# Patient Record
Sex: Male | Born: 1945 | Race: White | Hispanic: No | Marital: Married | Smoking: Former smoker
Health system: Southern US, Community
[De-identification: ages and names within clinical notes are randomized; demographics above are authoritative.]

## PROBLEM LIST (undated history)

## (undated) DIAGNOSIS — I10 Essential (primary) hypertension: Secondary | ICD-10-CM

## (undated) DIAGNOSIS — M6289 Other specified disorders of muscle: Secondary | ICD-10-CM

## (undated) HISTORY — PX: APPENDECTOMY: SHX54

## (undated) HISTORY — PX: HERNIA REPAIR: SHX51

## (undated) HISTORY — DX: Other specified disorders of muscle: M62.89

---

## 1999-03-08 ENCOUNTER — Ambulatory Visit (HOSPITAL_BASED_OUTPATIENT_CLINIC_OR_DEPARTMENT_OTHER): Admission: RE | Admit: 1999-03-08 | Discharge: 1999-03-08 | Payer: Self-pay | Admitting: Surgery

## 2004-10-23 ENCOUNTER — Ambulatory Visit: Payer: Self-pay | Admitting: Family Medicine

## 2004-11-02 ENCOUNTER — Ambulatory Visit: Payer: Self-pay

## 2004-11-29 ENCOUNTER — Ambulatory Visit: Payer: Self-pay | Admitting: Family Medicine

## 2006-11-19 DIAGNOSIS — M6289 Other specified disorders of muscle: Secondary | ICD-10-CM

## 2006-11-19 HISTORY — DX: Other specified disorders of muscle: M62.89

## 2006-11-22 ENCOUNTER — Ambulatory Visit (HOSPITAL_COMMUNITY): Admission: RE | Admit: 2006-11-22 | Discharge: 2006-11-22 | Payer: Self-pay | Admitting: *Deleted

## 2006-11-25 ENCOUNTER — Inpatient Hospital Stay (HOSPITAL_COMMUNITY): Admission: EM | Admit: 2006-11-25 | Discharge: 2007-01-06 | Payer: Self-pay | Admitting: Emergency Medicine

## 2006-11-28 ENCOUNTER — Ambulatory Visit: Payer: Self-pay | Admitting: Pulmonary Disease

## 2006-12-18 ENCOUNTER — Ambulatory Visit: Payer: Self-pay | Admitting: Infectious Diseases

## 2008-07-04 IMAGING — CR DG ABDOMEN ACUTE W/ 1V CHEST
3 series · 3 of 3 positions shown · non-contrast
Comparison: Comparison is made with chest radiograph from 11/22/06.

CLINICAL DATA: Nausea, vomiting, and constipation.
 ACUTE ABDOMINAL SERIES ? 3 VIEW:

[view not recorded (1 of 3)]
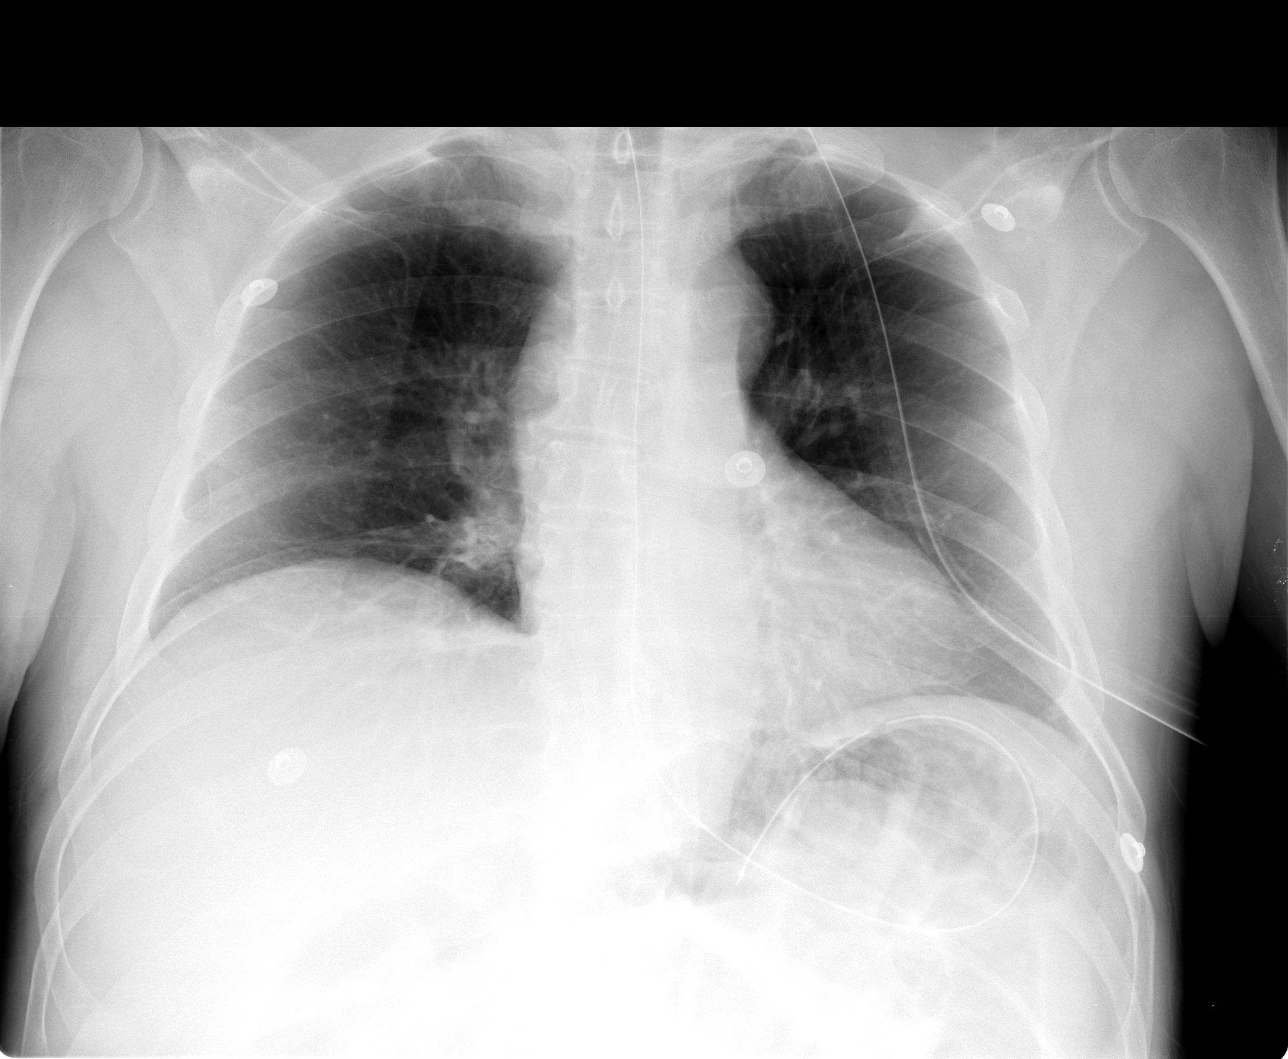

[view not recorded (2 of 3)]
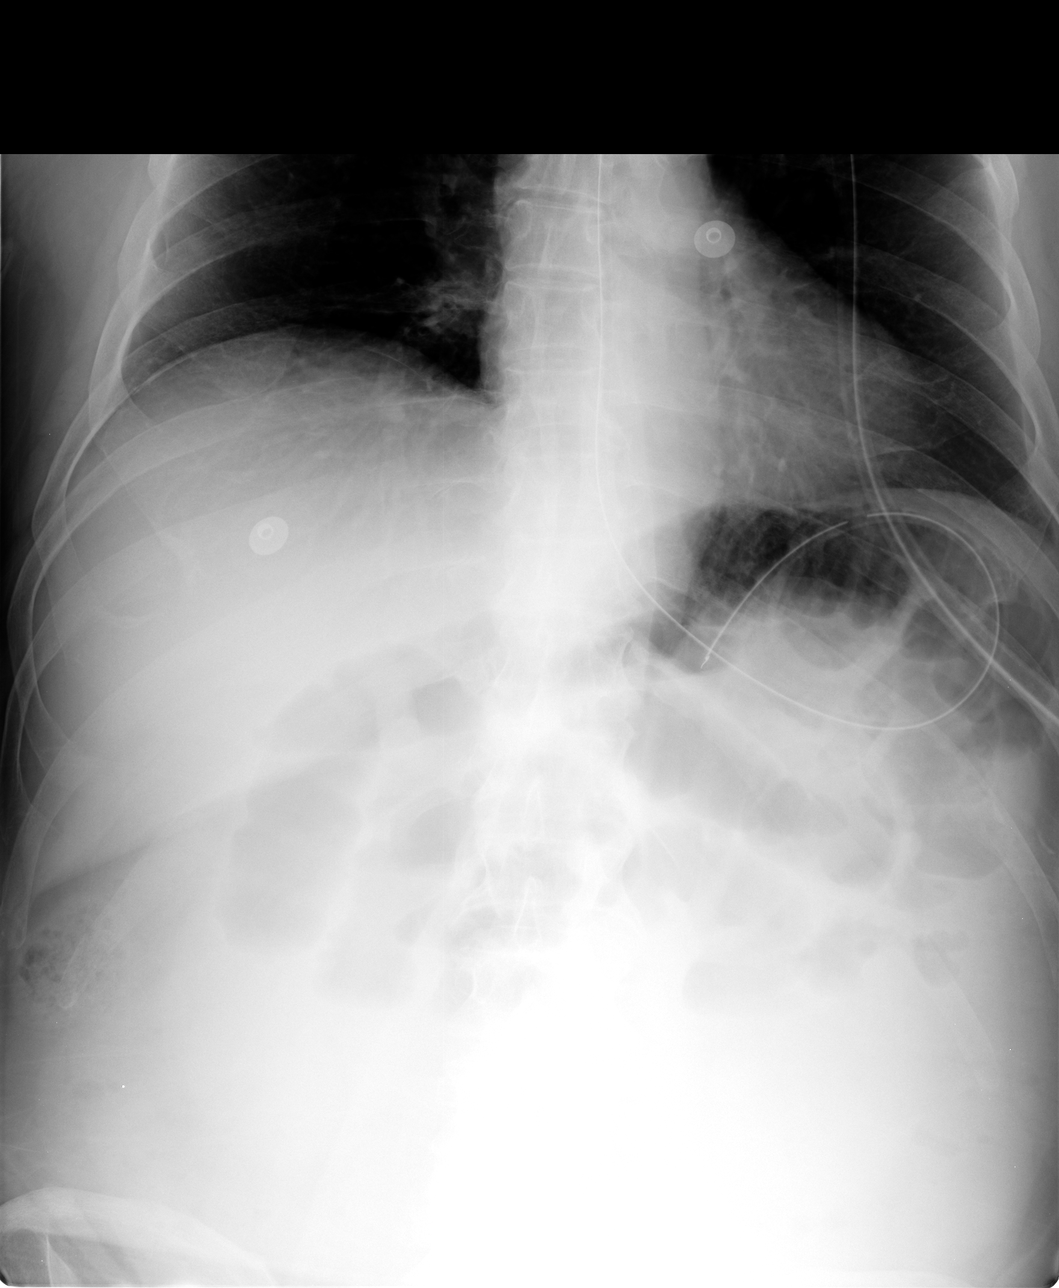

[view not recorded (3 of 3)]
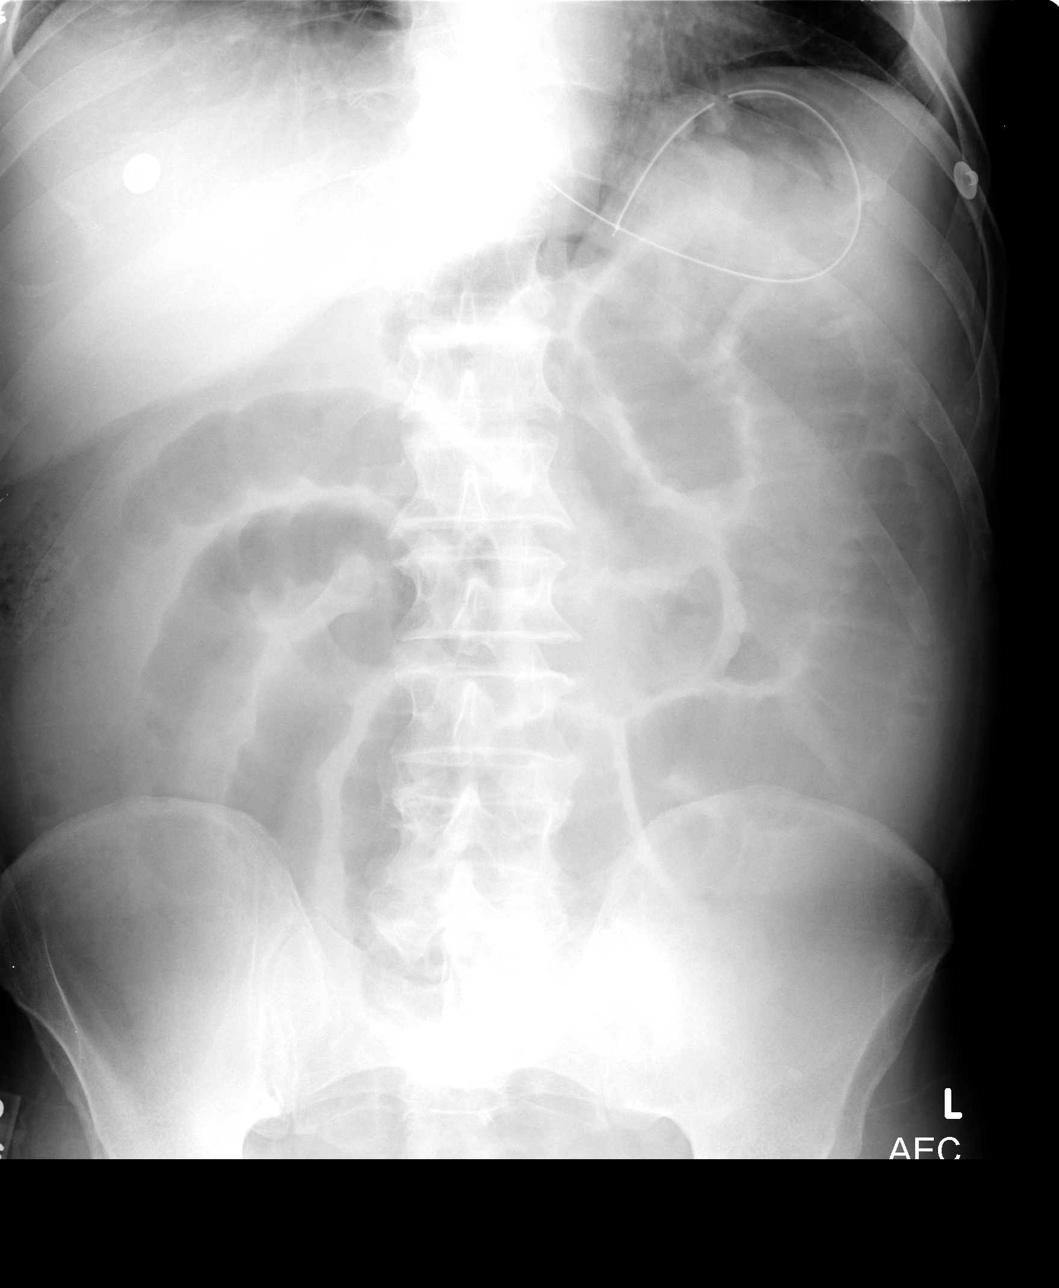

[3 of 3 positions shown; findings below may reference images not displayed]

FINDINGS: Nasogastric tube again terminates over the stomach. Lung volumes remain low with bibasilar atelectasis. Heart size is at the upper limits for normal. The aorta is unfolded. No free air beneath the diaphragms. 
 There are multiple dilated loops of small bowel with differential air-fluid levels, consistent with mid to distal small bowel obstruction. A paucity of gas in the colon is present. Surgical clips noted over the bilateral groins.
IMPRESSION: 1.  Mid to distal small bowel obstruction, no perforation. 
 2.  Low lung volumes with bibasilar atelectasis.

## 2008-07-12 IMAGING — CR DG ABD PORTABLE 1V
1 series · 1 of 1 positions shown · non-contrast
Comparison: 11/28/06.

CLINICAL DATA: Evaluate for ileus/small bowel obstruction.
 PORTABLE ABDOMEN ? 1 VIEW ? 12/03/06 ? 9982 HOURS:

[view not recorded]
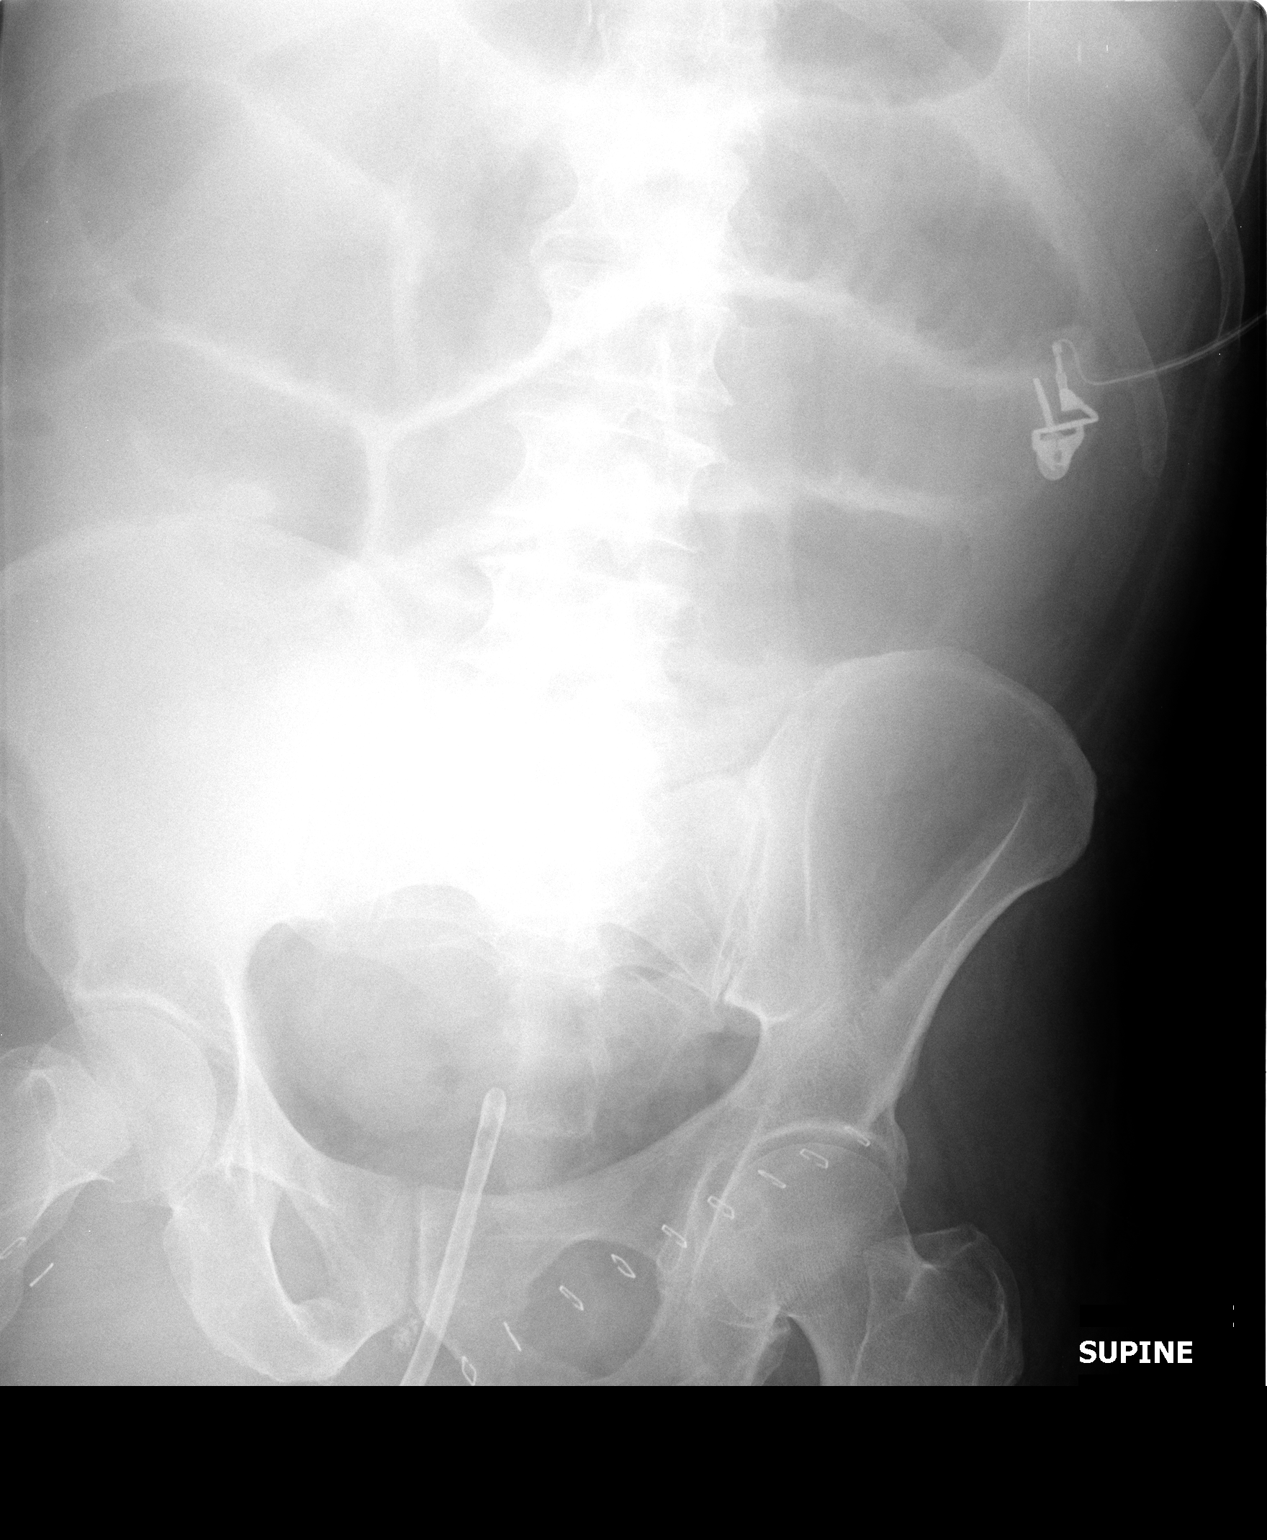

[1 of 1 positions shown; findings below may reference images not displayed]

FINDINGS: Limited portable exam reveals markedly dilated gas distended small bowel loops measuring up to 5.7 cm, which is the maximum dimension noted previously.  The folds appear slightly thickened.  The appearance is suggestive of a small bowel obstruction.  Free air cannot be excluded on a supine view.  Surgical clips inguinal region.  Catheter overlies the pelvis.
IMPRESSION: Persistent small bowel obstructive pattern with small bowel loops measuring up to 5.7 cm with thickened folds.

## 2008-07-12 IMAGING — CR DG CHEST 1V PORT
1 series · 1 of 1 positions shown · non-contrast
Comparison: 12/02/2006

CLINICAL DATA: Small bowel obstruction, renal failure. Status post extubation.

PORTABLE CHEST - 1 VIEW

[view not recorded]
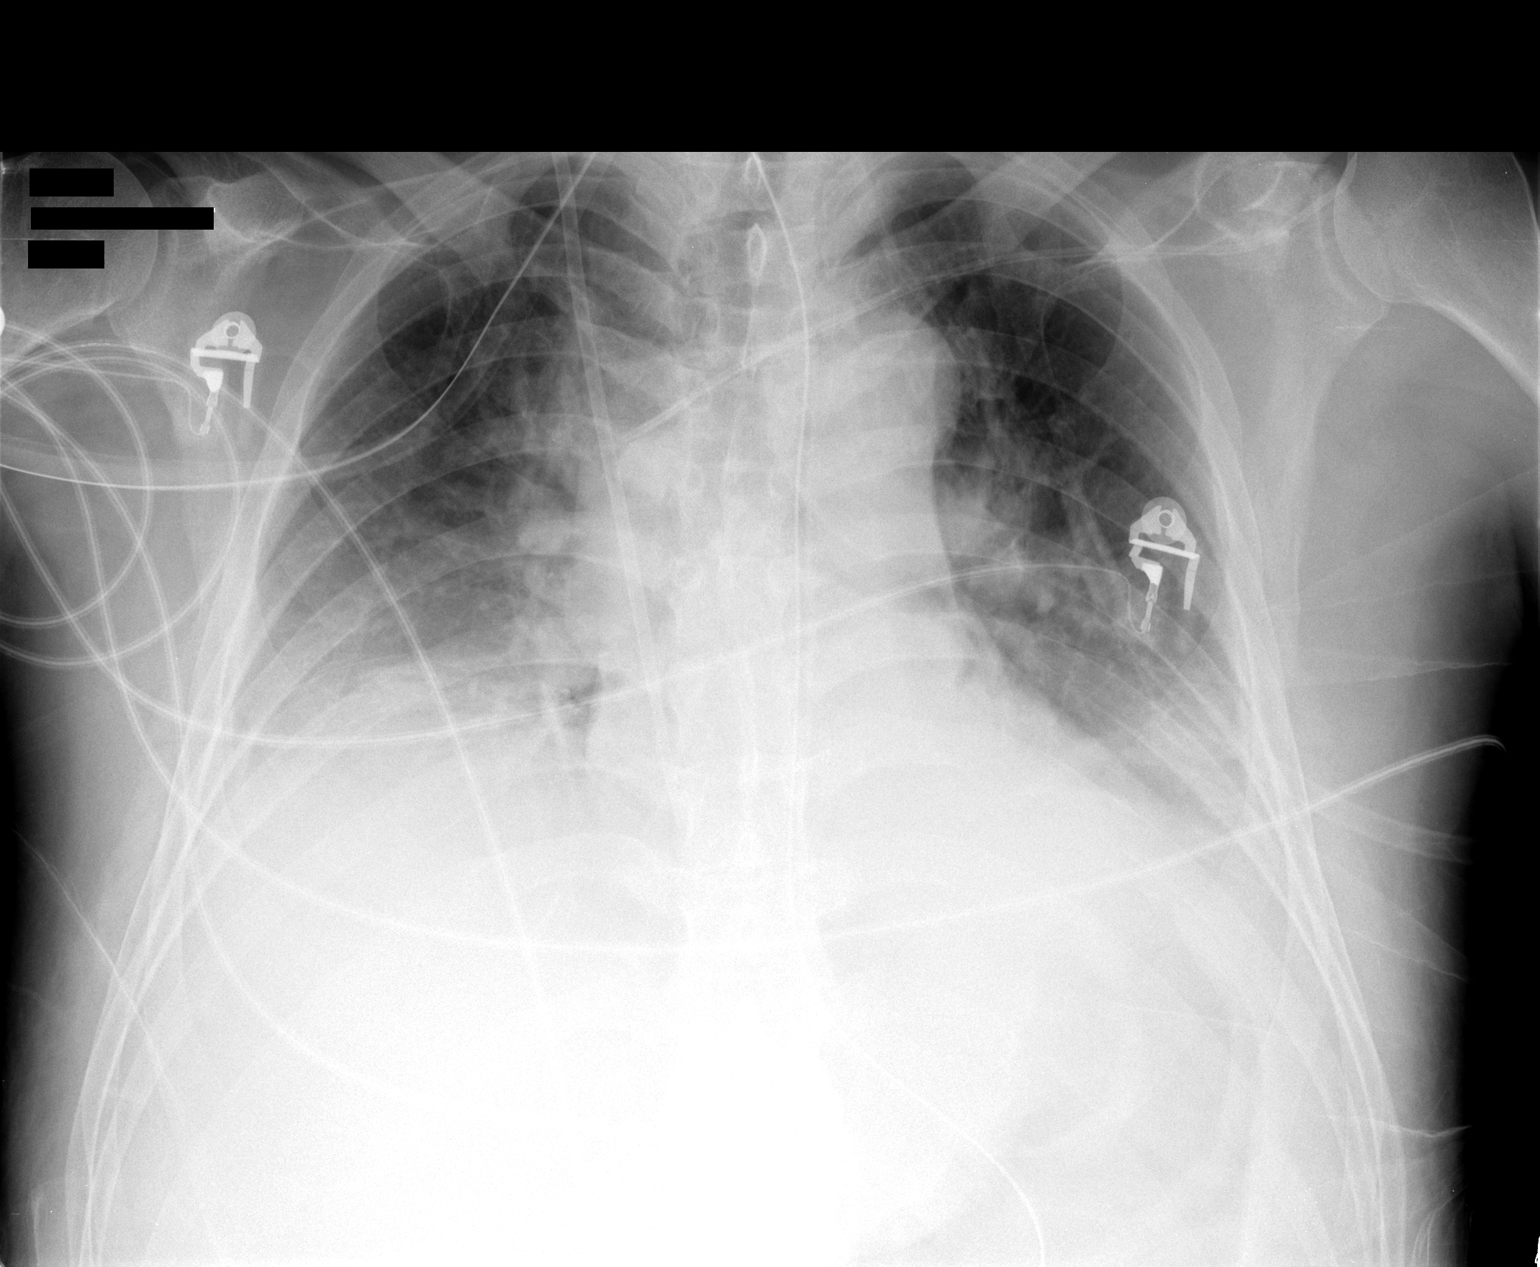

[1 of 1 positions shown; findings below may reference images not displayed]

FINDINGS: The endotracheal tube has been removed. Nasogastric tube and
bilateral central lines remain in place.

There is increased bibasilar airspace opacity with obscuration of the
hemidiaphragms, compatible with atelectasis. Layering pleural effusions cannot
be readily excluded. Heart size is at the upper limits of the normal range.  

IMPRESSION

1. Endotracheal tube has been removed.
2. Increased bibasilar airspace opacity likely representing atelectasis.

## 2008-07-14 IMAGING — CR DG CHEST 1V PORT
1 series · 1 of 1 positions shown · non-contrast
Comparison: 12/04/06.

CLINICAL DATA: Small bowel obstruction, renal failure.  Respiratory distress.
 PORTABLE CHEST ? 1 VIEW ? 12/05/06:

[view not recorded]
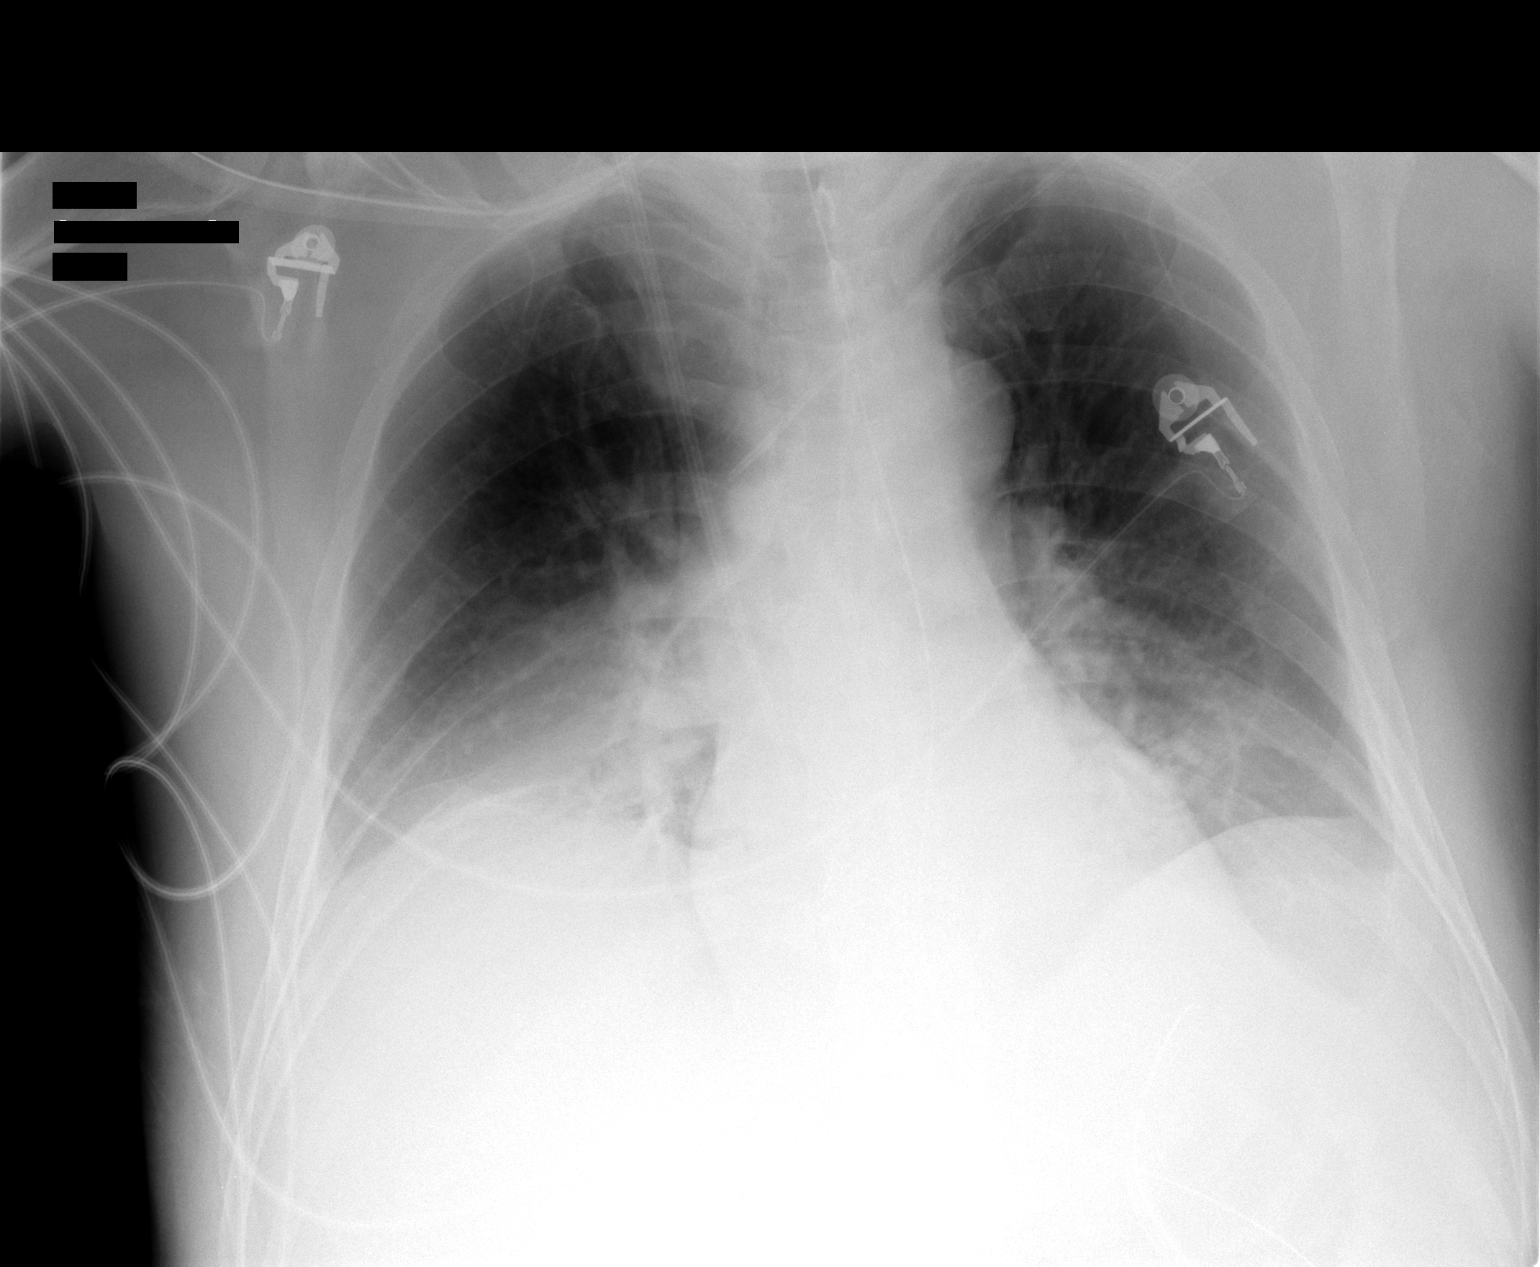

[1 of 1 positions shown; findings below may reference images not displayed]

FINDINGS: Left-sided central line terminates at the high superior vena cava.  There is a right IJ large bore catheter which terminates in the region of the superior cavoatrial junction.  Nasogastric tube is looped in the stomach with tip at the gastric fundus.  The trachea is midline.  The heart size is normal.  No pneumothorax.  Slight improvement in mild interstitial edema.  Similar right greater than left pleural effusions and adjacent bibasilar atelectasis.
IMPRESSION: 1.  Mild improvement in interstitial edema. 
 2.  Similar effusions and bibasilar atelectasis.

## 2016-09-26 ENCOUNTER — Encounter: Payer: Self-pay | Admitting: Family Medicine

## 2016-09-26 ENCOUNTER — Ambulatory Visit (INDEPENDENT_AMBULATORY_CARE_PROVIDER_SITE_OTHER): Payer: Medicare Other | Admitting: Family Medicine

## 2016-09-26 DIAGNOSIS — I10 Essential (primary) hypertension: Secondary | ICD-10-CM | POA: Diagnosis not present

## 2016-09-26 DIAGNOSIS — M8949 Other hypertrophic osteoarthropathy, multiple sites: Secondary | ICD-10-CM

## 2016-09-26 DIAGNOSIS — M150 Primary generalized (osteo)arthritis: Secondary | ICD-10-CM | POA: Diagnosis not present

## 2016-09-26 DIAGNOSIS — M159 Polyosteoarthritis, unspecified: Secondary | ICD-10-CM

## 2016-09-26 NOTE — Patient Instructions (Signed)
Good to see you today!  Thanks for coming in.  We will send for your medical records  Monitor your blood pressure - write down the readings.  Bring those in  Bring in your salves next visit  You can use otc Cortaid or Hydrocortione ointment - greasy  I recommend a colonoscopy to prvent colon cancer  Come back in 3 months

## 2016-09-26 NOTE — Progress Notes (Signed)
Subjective  First visit for this patient his wife and daughter have also joined our practice  Current Medical problems include  HYPERTENSION Disease Monitoring  Home BP Monitoring (Severity) he checks it occsl.  Last reading was 158/89 Symptoms - Chest pain- no    Dyspnea- no Medications (Modifying factors) Compliance-  Daily . Lightheadedness-  no  Edema- no Timing - continuous  Duration - years Is currently taking amlodipine and 40 meq Kcl per day as told to do by his prior PA because has low K.  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  No soft tissue swelling or redness or rashes.  Uses tylenol rarely.    Patient reports no  vision/ hearing changes,anorexia, weight change, fever ,adenopathy, persistant / recurrent hoarseness, swallowing issues, chest pain, edema,persistant / recurrent cough, hemoptysis, dyspnea(rest, exertional, paroxysmal nocturnal), gastrointestinal  bleeding (melena, rectal bleeding), abdominal pain, excessive heart burn, GU symptoms(dysuria, hematuria, pyuria, voiding/incontinence  Issues) syncope, focal weakness, severe memory loss, concerning skin lesions, depression, anxiety, abnormal bruising/bleeding, major joint swelling.     PMH Ruptured hernia repair in 2008 with subsequent sepsis and prolonged hospitilization     Chief Complaint noted Review of Symptoms - see HPI PMH - Smoking status noted.     Objective Vital Signs reviewed ATNC Ears:  External ear exam shows no significant lesions or deformities.  Otoscopic examination reveals clear canals, tympanic membranes are intact bilaterally without bulging, retraction, inflammation or discharge. Hearing is grossly normal bilaterall Neck:  No deformities, thyromegaly, masses, or tenderness noted.   Supple with full range of motion without pain. Heart - Regular rate and rhythm.  No murmurs, gallops or rubs.    Lungs:  Normal respiratory effort, chest  expands symmetrically. Lungs are clear to auscultation, no crackles or wheezes. Abdomen: soft and non-tender without masses, organomegaly or hernias noted.  No guarding or rebound Extremities:  No cyanosis, edema, or deformity noted with good range of motion of all major joints.   Skin:  Intact without suspicious lesions or rashes Mouth - no lesions, mucous membranes are moist, no decaying teeth      Assessments/Plans  No problem-specific Assessment & Plan notes found for this encounter.   See Encounter view if individual problem A/Ps not visible See after visit summary for details of patient instuctions

## 2016-09-27 ENCOUNTER — Encounter: Payer: Self-pay | Admitting: Family Medicine

## 2016-09-27 DIAGNOSIS — I10 Essential (primary) hypertension: Secondary | ICD-10-CM | POA: Insufficient documentation

## 2016-09-27 DIAGNOSIS — M1990 Unspecified osteoarthritis, unspecified site: Secondary | ICD-10-CM | POA: Insufficient documentation

## 2016-09-27 DIAGNOSIS — I872 Venous insufficiency (chronic) (peripheral): Secondary | ICD-10-CM | POA: Insufficient documentation

## 2016-09-27 NOTE — Assessment & Plan Note (Signed)
Multiple joints relatively mild

## 2016-09-27 NOTE — Assessment & Plan Note (Signed)
BP Readings from Last 3 Encounters:  09/26/16 (!) 158/89   Not at goal today.  Will send for records.  Is on an unsual regimen of CCB and K.  Will monitor See after visit summary

## 2016-10-10 ENCOUNTER — Ambulatory Visit: Payer: Self-pay | Admitting: Family Medicine

## 2016-11-21 ENCOUNTER — Ambulatory Visit (INDEPENDENT_AMBULATORY_CARE_PROVIDER_SITE_OTHER): Payer: Medicare HMO | Admitting: Family Medicine

## 2016-11-21 ENCOUNTER — Other Ambulatory Visit: Payer: Self-pay | Admitting: Family Medicine

## 2016-11-21 ENCOUNTER — Encounter: Payer: Self-pay | Admitting: Family Medicine

## 2016-11-21 DIAGNOSIS — I872 Venous insufficiency (chronic) (peripheral): Secondary | ICD-10-CM

## 2016-11-21 DIAGNOSIS — I10 Essential (primary) hypertension: Secondary | ICD-10-CM | POA: Diagnosis not present

## 2016-11-21 DIAGNOSIS — R296 Repeated falls: Secondary | ICD-10-CM

## 2016-11-21 LAB — COMPREHENSIVE METABOLIC PANEL
ALBUMIN: 4.1 g/dL (ref 3.6–5.1)
ALT: 13 U/L (ref 9–46)
AST: 14 U/L (ref 10–35)
Alkaline Phosphatase: 110 U/L (ref 40–115)
BILIRUBIN TOTAL: 0.9 mg/dL (ref 0.2–1.2)
BUN: 16 mg/dL (ref 7–25)
CO2: 27 mmol/L (ref 20–31)
CREATININE: 0.94 mg/dL (ref 0.70–1.18)
Calcium: 9.3 mg/dL (ref 8.6–10.3)
Chloride: 102 mmol/L (ref 98–110)
GLUCOSE: 107 mg/dL — AB (ref 65–99)
Potassium: 4 mmol/L (ref 3.5–5.3)
SODIUM: 139 mmol/L (ref 135–146)
Total Protein: 6.4 g/dL (ref 6.1–8.1)

## 2016-11-21 LAB — VITAMIN B12: VITAMIN B 12: 265 pg/mL (ref 200–1100)

## 2016-11-21 LAB — FOLATE: FOLATE: 13.5 ng/mL (ref >5.4)

## 2016-11-21 NOTE — Patient Instructions (Addendum)
Good to see you today!  Thanks for coming in.  Call in with the name of the leg cream you are using  Use vaseline on your legs at least twice a day  I will call you with the results of your blood test and what to do with your medications  When you come back for a followup blood test come in fasting no food for 8 hours  Go by a drug store several times and check your blood pressure and write it down

## 2016-11-21 NOTE — Progress Notes (Signed)
Subjective  Patient is presenting with the following illnesses  HYPERTENSION Disease Monitoring  Home BP Monitoring (Severity) They did not check at home Symptoms - Chest pain- no    Dyspnea- no Medications (Modifying factors) Compliance-  Daily amlodpine. Lightheadedness-  no  Edema- no Timing - continuous  Duration - years ROS - See HPI  Leg Rash Bilateral worse when dry very itchy.  Use small amounts of unknown ointment every other day (runs out too quickly otherwise)  No using any lubricants.   Sometimes he itches so hard it bleeds.  No current bleeding or fever   Falling Has frequent falls since had long inpatient stay about 4 years ago.  Is not progressive.  He feels he trips over things.  No recent injuries.   Does not think he has been tested for this.  No focal weakness or visual loss.  Does not drink alcohol     PMH Lab Review   Potassium  Date Value Ref Range Status  11/21/2016 4.0 3.5 - 5.3 mmol/L Final   Sodium  Date Value Ref Range Status  11/21/2016 139 135 - 146 mmol/L Final   Creat  Date Value Ref Range Status  11/21/2016 0.94 0.70 - 1.18 mg/dL Final    Comment:      For patients > or = 71 years of age: The upper reference limit for Creatinine is approximately 13% higher for people identified as African-American.          Chief Complaint noted Review of Symptoms - see HPI PMH - Smoking status noted.     Objective Vital Signs reviewed  Alert interactive Heart - Regular rate and rhythm.  No murmurs, gallops or rubs.    Lungs:  Normal respiratory effort, chest expands symmetrically. Lungs are clear to auscultation, no crackles or wheezes. Legs - hyperpigmented diffuse rash on lower shins.  No skin breakdown.  Minimal edema Able to stnd and walk and turn without assistance.  Able to stand on heels and toes    Assessments/Plans  No problem-specific Assessment & Plan notes found for this encounter.   See Encounter view if individual problem A/Ps  not visible See after visit summary for details of patient instuctions

## 2016-11-21 NOTE — Telephone Encounter (Signed)
Pt needs refills on amlodipine, potassium, and clotrimazole cream. Pt uses [facility] in [place]. ep

## 2016-11-22 ENCOUNTER — Encounter: Payer: Self-pay | Admitting: Family Medicine

## 2016-11-22 MED ORDER — AMLODIPINE BESYLATE 5 MG PO TABS: 5.0000 mg | ORAL_TABLET | Freq: Every day | ORAL | 1 refills | Status: DC

## 2016-11-22 MED ORDER — POTASSIUM CHLORIDE ER 10 MEQ PO TBCR: 20.0000 meq | EXTENDED_RELEASE_TABLET | Freq: Every day | ORAL | 1 refills | Status: DC

## 2016-11-22 NOTE — Progress Notes (Signed)
Subjective  Patient is presenting with the following illnesses     Chief Complaint noted Review of Symptoms - see HPI PMH - Smoking status noted.     Objective Vital Signs reviewed     Assessments/Plans  No problem-specific Assessment & Plan notes found for this encounter.   See Encounter view if individual problem A/Ps not visible See after visit summary for details of patient instuctions 

## 2016-11-22 NOTE — Assessment & Plan Note (Signed)
BP Readings from Last 3 Encounters:  11/21/16 (!) 150/78  09/26/16 (!) 158/89   Not at goal.   Will again ask to monitor at home.  Will check labs and lower his K dose if not low.

## 2016-11-22 NOTE — Assessment & Plan Note (Signed)
Seems to be very chronic.  Will check CMET and b12 folate.  Does not seem to have focal weakness or signs of focal CNS involvement.   Will monitor and get neuroimaging if persists or worsens

## 2016-11-22 NOTE — Assessment & Plan Note (Signed)
His lower leg rash seem consistent with stasis.  Will have him lubricate daily and he will call in name of cream he is using

## 2016-11-23 ENCOUNTER — Telehealth: Payer: Self-pay | Admitting: Family Medicine

## 2016-11-23 NOTE — Telephone Encounter (Signed)
Spoke with pharmacy and the pharmacist filled the script yesterday and just switched out the tablet form for the capsules and continued with the original script you sent in yesterday.  Patient made aware. Jazmin Hartsell,CMA

## 2016-11-23 NOTE — Telephone Encounter (Signed)
I can not find any 10 meq KCL capsules in our eRx database  Please call pharmacy and give a refill on his current Potassium but instead of 4 caps a day he should take two caps daily #60 with 3 refills   Also ask them for the specific name of his potassium pill  Thanks  LC

## 2016-11-23 NOTE — Telephone Encounter (Signed)
Pt called because we called in the potassium tablets in and he can not take any tablets. We need to change this to capsules so that he can take these. jw

## 2017-01-31 ENCOUNTER — Other Ambulatory Visit: Payer: Self-pay | Admitting: Family Medicine

## 2017-02-01 ENCOUNTER — Other Ambulatory Visit: Payer: Self-pay | Admitting: Family Medicine

## 2017-02-01 MED ORDER — POTASSIUM CHLORIDE ER 10 MEQ PO CPCR: ORAL_CAPSULE | ORAL | 2 refills | Status: DC

## 2017-02-01 NOTE — Telephone Encounter (Signed)
Needs refill on potassium.  [facility]

## 2017-03-06 ENCOUNTER — Encounter: Payer: Self-pay | Admitting: Family Medicine

## 2017-03-06 ENCOUNTER — Ambulatory Visit (INDEPENDENT_AMBULATORY_CARE_PROVIDER_SITE_OTHER): Payer: Medicare HMO | Admitting: Family Medicine

## 2017-03-06 VITALS — BP 142/86 | HR 67 | Temp 97.7°F | Ht 68.0 in | Wt 187.2 lb

## 2017-03-06 DIAGNOSIS — Z13220 Encounter for screening for lipoid disorders: Secondary | ICD-10-CM

## 2017-03-06 DIAGNOSIS — T1502XA Foreign body in cornea, left eye, initial encounter: Secondary | ICD-10-CM | POA: Diagnosis not present

## 2017-03-06 DIAGNOSIS — M159 Polyosteoarthritis, unspecified: Secondary | ICD-10-CM

## 2017-03-06 DIAGNOSIS — M8949 Other hypertrophic osteoarthropathy, multiple sites: Secondary | ICD-10-CM

## 2017-03-06 DIAGNOSIS — R635 Abnormal weight gain: Secondary | ICD-10-CM | POA: Diagnosis not present

## 2017-03-06 DIAGNOSIS — Z1159 Encounter for screening for other viral diseases: Secondary | ICD-10-CM | POA: Diagnosis not present

## 2017-03-06 DIAGNOSIS — H2513 Age-related nuclear cataract, bilateral: Secondary | ICD-10-CM | POA: Diagnosis not present

## 2017-03-06 DIAGNOSIS — I10 Essential (primary) hypertension: Secondary | ICD-10-CM | POA: Diagnosis not present

## 2017-03-06 DIAGNOSIS — M150 Primary generalized (osteo)arthritis: Secondary | ICD-10-CM

## 2017-03-06 NOTE — Assessment & Plan Note (Signed)
BP Readings from Last 3 Encounters:  03/06/17 (!) 142/86  11/21/16 (!) 150/78  09/26/16 (!) 158/89   Close to goal.  Will check labs may need increase

## 2017-03-06 NOTE — Assessment & Plan Note (Signed)
Worsening likely due to cutting back on physical activity and over eating.  See after visit summary

## 2017-03-06 NOTE — Progress Notes (Signed)
Subjective  Patient is presenting with the following illnesses  HYPERTENSION Disease Monitoring  Home BP Monitoring (Severity) not checking no meter Symptoms - Chest pain- no    Dyspnea- no Medications (Modifying factors) Compliance-  daily. Lightheadedness-  no  Edema- no Timing - continuous  Duration - years ROS - See HPI  PMH Lab Review   Potassium  Date Value Ref Range Status  11/21/2016 4.0 3.5 - 5.3 mmol/L Final   Sodium  Date Value Ref Range Status  11/21/2016 139 135 - 146 mmol/L Final   Creat  Date Value Ref Range Status  11/21/2016 0.94 0.70 - 1.18 mg/dL Final    Comment:      For patients > or = 71 years of age: The upper reference limit for Creatinine is approximately 13% higher for people identified as African-American.          ARTHRITIS Disease monitoring Most involved joints: all over limbs.   Symptoms:  Achy pain     Limitations: not working as much has cut back a good deal  Medication Management Compliance:  Tylenol daily   Abdominal pain: no   GI Bleeding:  No  Stasis Dermatitis Improved with vaseline but only using once a day.  No fevers or redness.  No spread    Chief Complaint noted Review of Symptoms - see HPI PMH - Smoking status noted.     Objective Vital Signs reviewed Heart - Regular rate and rhythm.  No murmurs, gallops or rubs.    Lungs:  Normal respiratory effort, chest expands symmetrically. Lungs are clear to auscultation, no crackles or wheezes. Extrem - no edema - stasis dermatitis with some scaly bilateral lower legs     Assessments/Plans  No problem-specific Assessment & Plan notes found for this encounter.   See Encounter view if individual problem A/Ps not visible See after visit summary for details of patient instuctions

## 2017-03-06 NOTE — Assessment & Plan Note (Signed)
Stable

## 2017-03-06 NOTE — Patient Instructions (Addendum)
Good to see you today!  Thanks for coming in.  I will call you if your tests are not good.  Otherwise I will send you a letter.  If you do not hear from me with in 2 weeks please call our office.     You need to get a colonoscopy to make sure you do not have colon cancer  Try to check your blood pressure when you can.  Call if it is regularly > 140/90  You are gaining too much weight.  Cut back on all your portion sizes as we discussed   Come back in 6 months

## 2017-03-07 ENCOUNTER — Encounter: Payer: Self-pay | Admitting: Family Medicine

## 2017-03-07 ENCOUNTER — Other Ambulatory Visit: Payer: Self-pay | Admitting: Family Medicine

## 2017-03-07 LAB — CMP14+EGFR
ALBUMIN: 4.4 g/dL (ref 3.5–4.8)
ALT: 26 IU/L (ref 0–44)
AST: 29 IU/L (ref 0–40)
Albumin/Globulin Ratio: 1.8 (ref 1.2–2.2)
Alkaline Phosphatase: 120 IU/L — ABNORMAL HIGH (ref 39–117)
BILIRUBIN TOTAL: 0.9 mg/dL (ref 0.0–1.2)
BUN / CREAT RATIO: 20 (ref 10–24)
BUN: 20 mg/dL (ref 8–27)
CHLORIDE: 99 mmol/L (ref 96–106)
CO2: 21 mmol/L (ref 18–29)
CREATININE: 0.98 mg/dL (ref 0.76–1.27)
Calcium: 9.3 mg/dL (ref 8.6–10.2)
GFR calc non Af Amer: 77 mL/min/{1.73_m2} (ref >59)
GFR, EST AFRICAN AMERICAN: 89 mL/min/{1.73_m2} (ref >59)
GLUCOSE: 94 mg/dL (ref 65–99)
Globulin, Total: 2.5 g/dL (ref 1.5–4.5)
Potassium: 4.3 mmol/L (ref 3.5–5.2)
Sodium: 140 mmol/L (ref 134–144)
TOTAL PROTEIN: 6.9 g/dL (ref 6.0–8.5)

## 2017-03-07 LAB — LIPID PANEL
CHOL/HDL RATIO: 3.3 ratio (ref 0.0–5.0)
Cholesterol, Total: 177 mg/dL (ref 100–199)
HDL: 53 mg/dL (ref >39)
LDL CALC: 78 mg/dL (ref 0–99)
TRIGLYCERIDES: 229 mg/dL — AB (ref 0–149)
VLDL Cholesterol Cal: 46 mg/dL — ABNORMAL HIGH (ref 5–40)

## 2017-03-07 LAB — HEPATITIS C ANTIBODY

## 2017-03-07 LAB — CBC
HEMATOCRIT: 39.6 % (ref 37.5–51.0)
HEMOGLOBIN: 13.1 g/dL (ref 13.0–17.7)
MCH: 27.8 pg (ref 26.6–33.0)
MCHC: 33.1 g/dL (ref 31.5–35.7)
MCV: 84 fL (ref 79–97)
Platelets: 231 10*3/uL (ref 150–379)
RBC: 4.72 x10E6/uL (ref 4.14–5.80)
RDW: 14 % (ref 12.3–15.4)
WBC: 5 10*3/uL (ref 3.4–10.8)

## 2017-03-07 NOTE — Progress Notes (Unsigned)
Subjective  Patient is presenting with the following illnesses     Chief Complaint noted Review of Symptoms - see HPI PMH - Smoking status noted.     Objective Vital Signs reviewed     Assessments/Plans  No problem-specific Assessment & Plan notes found for this encounter.   See Encounter view if individual problem A/Ps not visible See after visit summary for details of patient instuctions 

## 2017-04-02 ENCOUNTER — Telehealth: Payer: Self-pay | Admitting: Family Medicine

## 2017-04-02 NOTE — Telephone Encounter (Signed)
Spoke to his wife and she reports he doesn't want to get a colonoscopy done. She's tried to convince him it's beneficial but he ignored her advise. She also reports since he had his hernia surgery he can no longer work and his mind has been going "backwards". I asked her if she would like to schedule him a appt with Dr. Erin Hearing. She was unsure and states she will call back when she decides. - Mesha Guinyard

## 2017-05-03 ENCOUNTER — Other Ambulatory Visit: Payer: Self-pay | Admitting: Family Medicine

## 2017-08-01 ENCOUNTER — Other Ambulatory Visit: Payer: Self-pay | Admitting: *Deleted

## 2017-08-01 MED ORDER — POTASSIUM CHLORIDE ER 10 MEQ PO CPCR: ORAL_CAPSULE | ORAL | 0 refills | Status: DC

## 2017-08-01 NOTE — Telephone Encounter (Signed)
Unclear why on K+ since not on diuretic.  Will refill and ask to make an appointment.

## 2017-08-01 NOTE — Telephone Encounter (Signed)
Refill request for 90 day supply.  Shemika Robbs L, RN  

## 2017-08-25 ENCOUNTER — Other Ambulatory Visit: Payer: Self-pay | Admitting: Family Medicine

## 2017-09-04 ENCOUNTER — Encounter: Payer: Self-pay | Admitting: Family Medicine

## 2017-09-04 ENCOUNTER — Ambulatory Visit (INDEPENDENT_AMBULATORY_CARE_PROVIDER_SITE_OTHER): Payer: Medicare HMO | Admitting: Family Medicine

## 2017-09-04 VITALS — BP 146/88 | HR 74 | Temp 98.3°F | Ht 68.0 in | Wt 174.2 lb

## 2017-09-04 DIAGNOSIS — R635 Abnormal weight gain: Secondary | ICD-10-CM | POA: Diagnosis not present

## 2017-09-04 DIAGNOSIS — Z23 Encounter for immunization: Secondary | ICD-10-CM | POA: Diagnosis not present

## 2017-09-04 DIAGNOSIS — M8949 Other hypertrophic osteoarthropathy, multiple sites: Secondary | ICD-10-CM

## 2017-09-04 DIAGNOSIS — M159 Polyosteoarthritis, unspecified: Secondary | ICD-10-CM

## 2017-09-04 DIAGNOSIS — M150 Primary generalized (osteo)arthritis: Secondary | ICD-10-CM | POA: Diagnosis not present

## 2017-09-04 DIAGNOSIS — I10 Essential (primary) hypertension: Secondary | ICD-10-CM

## 2017-09-04 DIAGNOSIS — Z1211 Encounter for screening for malignant neoplasm of colon: Secondary | ICD-10-CM | POA: Diagnosis not present

## 2017-09-04 DIAGNOSIS — I872 Venous insufficiency (chronic) (peripheral): Secondary | ICD-10-CM | POA: Diagnosis not present

## 2017-09-04 NOTE — Assessment & Plan Note (Signed)
Will check Bmet for K.  If normal stop his K pill.  Monitor blood pressure at home (wife is to buy a blood pressure cuff)

## 2017-09-04 NOTE — Assessment & Plan Note (Signed)
He feels is stable

## 2017-09-04 NOTE — Assessment & Plan Note (Signed)
Improved.  Cautioned about frequent Dr Malachi Bonds drinking

## 2017-09-04 NOTE — Patient Instructions (Signed)
Good to see you today!  Thanks for coming in.  I will call you if your tests are not good.  Otherwise I will send you a letter.  If you do not hear from me with in 2 weeks please call our office.     Cut back on the Dr Samson Frederic - try flavored water.  Your weight is good now try to keep it the same  Come back in 6 months for a blood pressure and blood sugar and weight check  Happy Holidays

## 2017-09-04 NOTE — Progress Notes (Signed)
Subjective  Patient is presenting with the following illnesses  STASIS DERMATITIS Feels is better.  Still itches but less scratching and sores.  No swelling.  Using vaseline  WEIGHT GAIN Has worked on decreasing food at night.  Feels has lost weight.  No fever chills or loss of appetite   HYPERTENSION Disease Monitoring  Home BP Monitoring (Severity) Not checking Symptoms - Chest pain- no    Dyspnea- no Medications (Modifying factors) Compliance-  daily. Lightheadedness-  no  Edema- no Timing - continuous  Duration - years ROS - See HPI  PMH Lab Review   Potassium  Date Value Ref Range Status  03/06/2017 4.3 3.5 - 5.2 mmol/L Final   Sodium  Date Value Ref Range Status  03/06/2017 140 134 - 144 mmol/L Final   Creat  Date Value Ref Range Status  11/21/2016 0.94 0.70 - 1.18 mg/dL Final    Comment:      For patients > or = 71 years of age: The upper reference limit for Creatinine is approximately 13% higher for people identified as African-American.      Creatinine, Ser  Date Value Ref Range Status  03/06/2017 0.98 0.76 - 1.27 mg/dL Final           Chief Complaint noted Review of Symptoms - see HPI PMH - Smoking status noted.     Objective Vital Signs reviewed Heart - Regular rate and rhythm.  No murmurs, gallops or rubs.    Lungs:  Normal respiratory effort, chest expands symmetrically. Lungs are clear to auscultation, no crackles or wheezes. Extrem - lower legs with varicose veins hyperpigmentation and dry.  No open sores.  No edema Healing laceration on left wrist well bandaged with steristrips   Assessments/Plans  DJD (degenerative joint disease) He feels is stable   Stasis dermatitis Improved continue treatment - more frequent vaseline  Weight gain Improved.  Cautioned about frequent Dr Malachi Bonds drinking   Essential hypertension Will check Bmet for K.  If normal stop his K pill.  Monitor blood pressure at home (wife is to buy a blood pressure  cuff)    See after visit summary for details of patient instuctions

## 2017-09-04 NOTE — Assessment & Plan Note (Signed)
Improved continue treatment - more frequent vaseline

## 2017-09-05 ENCOUNTER — Encounter: Payer: Self-pay | Admitting: Family Medicine

## 2017-09-05 LAB — BASIC METABOLIC PANEL
BUN / CREAT RATIO: 13 (ref 10–24)
BUN: 12 mg/dL (ref 8–27)
CO2: 26 mmol/L (ref 20–29)
Calcium: 9.7 mg/dL (ref 8.6–10.2)
Chloride: 103 mmol/L (ref 96–106)
Creatinine, Ser: 0.89 mg/dL (ref 0.76–1.27)
GFR calc Af Amer: 99 mL/min/{1.73_m2} (ref >59)
GFR, EST NON AFRICAN AMERICAN: 86 mL/min/{1.73_m2} (ref >59)
GLUCOSE: 97 mg/dL (ref 65–99)
POTASSIUM: 4.3 mmol/L (ref 3.5–5.2)
SODIUM: 146 mmol/L — AB (ref 134–144)

## 2017-10-08 ENCOUNTER — Encounter: Payer: Self-pay | Admitting: Family Medicine

## 2017-11-13 ENCOUNTER — Other Ambulatory Visit: Payer: Self-pay | Admitting: Family Medicine

## 2017-11-22 ENCOUNTER — Other Ambulatory Visit: Payer: Self-pay | Admitting: Family Medicine

## 2017-12-06 ENCOUNTER — Ambulatory Visit (INDEPENDENT_AMBULATORY_CARE_PROVIDER_SITE_OTHER): Payer: Medicare HMO | Admitting: Family Medicine

## 2017-12-06 ENCOUNTER — Encounter: Payer: Self-pay | Admitting: Family Medicine

## 2017-12-06 DIAGNOSIS — R059 Cough, unspecified: Secondary | ICD-10-CM

## 2017-12-06 DIAGNOSIS — R05 Cough: Secondary | ICD-10-CM | POA: Diagnosis not present

## 2017-12-06 NOTE — Progress Notes (Signed)
   Subjective:   Patient ID: Daniel [name]    DOB: [birth year], 72 y.o. male   MRN: [id number]  CC: cough  HPI: Daniel [name] is a 71 y.o. male who presents to clinic today for cough and congestion.  Symptoms first began about 1 week ago with cough, runny nose, sneezing and some mild congestion.  Sore throat on [name], but has since resolved.  Cough is productive of yellow sputum and is non-bloody.  Denies fevers, nausea, vomiting, diarrhea, abdominal pain.  Denies CP and SOB.  Endorses body aches and chills.  He has been eating and drinking fine.  He took Dayquil for his symptoms and states he does not drink as much water as he probably should. However, he is able to tolerate po.     ROS: See HPI for pertinent ROS.  [place]: Pertinent past medical, surgical, family, and social history were reviewed and updated as appropriate. Smoking status reviewed Medications reviewed.  Objective:   BP 130/80 (BP Location: Left Arm, Patient Position: Sitting, Cuff Size: Normal)   Pulse 79   Temp 98.2 F (36.8 C) (Oral)   Ht 5\' 8"  (1.727 m)   Wt 180 lb 3.2 oz (81.7 kg)   SpO2 96%   BMI 27.40 kg/m  Vitals and nursing note reviewed.  General: elderly 72 yo male, NAD  HEENT: NCAT, PERRL, EOMI, MMM, o/p clear without tonsillar exudate or erythema  Neck: supple, no LAD  CV: RRR no MRG  Lungs: CTAB, nonlaboured  Abdomen: soft, NTND, +bs  Skin: warm, dry, no rash Extremities: warm and well perfused Neuro: AOx3, no focal deficits   Assessment & Plan:   Cough Likely 2/2 influenza, with URI symptoms x 1 week; some resolution of symptoms such as sore throat.  Without red flags on exam and vitals stable.  No findings on lung exam to suggest pneumonia or other more serious infection.  He is out of the 48 hour window for which Tamiflu would benefit.   -Advised supportive measures ie warm fluids, increase water intake  -Tylenol if needed for fever  -Continue Dayquil for symptomatic tx; tessalon pearles  offered however pt declined -return precautions discussed   Follow up: if symptoms worsen or do not improve.   Lovenia Kim, MD [place], PGY-2 12/09/2017 10:19 PM

## 2017-12-06 NOTE — Patient Instructions (Signed)
You were seen in clinic for flu-like symptoms which is most likely what you have.  Unfortunately, as we discussed, you are out of the 48 hour window when Tamiflu would be beneficial for you.  I suspect your symptoms should start to improve over the next 3-4 days as it has been one week. You can continue to take Dayquil for symptomatic treatment of runny nose, cough, congestion and body aches.  I would like you to make sure you are frequently washing your hands to avoid spread. It is also equally important for you to make sure you are drinking plenty of fluids as this will help your recovery.   If your symptoms do not improve by next week I would like you to come in and be seen.    Be well, Lovenia Kim, MD

## 2017-12-09 DIAGNOSIS — R059 Cough, unspecified: Secondary | ICD-10-CM | POA: Insufficient documentation

## 2017-12-09 DIAGNOSIS — R05 Cough: Secondary | ICD-10-CM | POA: Insufficient documentation

## 2017-12-09 NOTE — Assessment & Plan Note (Signed)
Likely 2/2 influenza, with URI symptoms x 1 week; some resolution of symptoms such as sore throat.  Without red flags on exam and vitals stable.  No findings on lung exam to suggest pneumonia or other more serious infection.  He is out of the 48 hour window for which Tamiflu would benefit.   -Advised supportive measures ie warm fluids, increase water intake  -Tylenol if needed for fever  -Continue Dayquil for symptomatic tx; tessalon pearles offered however pt declined -return precautions discussed

## 2018-02-13 ENCOUNTER — Telehealth: Payer: Self-pay | Admitting: Family Medicine

## 2018-02-13 NOTE — Telephone Encounter (Signed)
Spoke to both the pt and his wife and he has not had a colonoscopy and is concerned about an internal instrument.  I spoke to them about have a stool sample collection and speaking to you if that is the best option for the pt.  Daniel [name] said that he will think about it and both he and his wife will discuss it with you at his next appt.

## 2018-03-27 DIAGNOSIS — Z9181 History of falling: Secondary | ICD-10-CM | POA: Diagnosis not present

## 2018-03-27 DIAGNOSIS — I10 Essential (primary) hypertension: Secondary | ICD-10-CM | POA: Diagnosis not present

## 2018-03-27 DIAGNOSIS — Z809 Family history of malignant neoplasm, unspecified: Secondary | ICD-10-CM | POA: Diagnosis not present

## 2018-03-27 DIAGNOSIS — Z8249 Family history of ischemic heart disease and other diseases of the circulatory system: Secondary | ICD-10-CM | POA: Diagnosis not present

## 2018-03-27 DIAGNOSIS — R52 Pain, unspecified: Secondary | ICD-10-CM | POA: Diagnosis not present

## 2018-05-31 ENCOUNTER — Other Ambulatory Visit: Payer: Self-pay | Admitting: Family Medicine

## 2018-08-24 ENCOUNTER — Other Ambulatory Visit: Payer: Self-pay | Admitting: Family Medicine

## 2018-08-26 DIAGNOSIS — H2513 Age-related nuclear cataract, bilateral: Secondary | ICD-10-CM | POA: Diagnosis not present

## 2018-09-30 DIAGNOSIS — D3132 Benign neoplasm of left choroid: Secondary | ICD-10-CM | POA: Diagnosis not present

## 2018-09-30 DIAGNOSIS — H2513 Age-related nuclear cataract, bilateral: Secondary | ICD-10-CM | POA: Diagnosis not present

## 2018-10-30 DIAGNOSIS — H2511 Age-related nuclear cataract, right eye: Secondary | ICD-10-CM | POA: Diagnosis not present

## 2018-11-17 DIAGNOSIS — H2512 Age-related nuclear cataract, left eye: Secondary | ICD-10-CM | POA: Diagnosis not present

## 2018-11-20 DIAGNOSIS — H2512 Age-related nuclear cataract, left eye: Secondary | ICD-10-CM | POA: Diagnosis not present

## 2018-11-27 ENCOUNTER — Other Ambulatory Visit: Payer: Self-pay | Admitting: Family Medicine

## 2018-12-21 ENCOUNTER — Other Ambulatory Visit: Payer: Self-pay | Admitting: Family Medicine

## 2018-12-24 ENCOUNTER — Encounter: Payer: Medicare HMO | Admitting: Family Medicine

## 2019-01-06 ENCOUNTER — Encounter: Payer: Self-pay | Admitting: Family Medicine

## 2019-01-06 ENCOUNTER — Ambulatory Visit (INDEPENDENT_AMBULATORY_CARE_PROVIDER_SITE_OTHER): Payer: Medicare HMO | Admitting: Family Medicine

## 2019-01-06 ENCOUNTER — Other Ambulatory Visit: Payer: Self-pay

## 2019-01-06 VITALS — BP 144/80 | HR 68 | Temp 98.2°F | Ht 68.0 in | Wt 185.2 lb

## 2019-01-06 DIAGNOSIS — Z23 Encounter for immunization: Secondary | ICD-10-CM

## 2019-01-06 DIAGNOSIS — I10 Essential (primary) hypertension: Secondary | ICD-10-CM | POA: Diagnosis not present

## 2019-01-06 DIAGNOSIS — Z1211 Encounter for screening for malignant neoplasm of colon: Secondary | ICD-10-CM | POA: Diagnosis not present

## 2019-01-06 DIAGNOSIS — R413 Other amnesia: Secondary | ICD-10-CM

## 2019-01-06 MED ORDER — AMLODIPINE BESYLATE 5 MG PO TABS: 5.0000 mg | ORAL_TABLET | Freq: Every day | ORAL | 0 refills | Status: DC

## 2019-01-06 NOTE — Assessment & Plan Note (Signed)
Nonfocal exam and he seems mostly intact.  Will check labs and monitor

## 2019-01-06 NOTE — Assessment & Plan Note (Signed)
Not at goal but was a stressful visit.  Will monitor and check labs.  May need to increase amlodipine

## 2019-01-06 NOTE — Progress Notes (Signed)
Subjective  Daniel [name] is a 72 y.o. male is presenting with the following  MEMORY He but mostly his family feels he is having memory problems such as leaving on the stove.  Also is more emotional mostly angry.  Has not gotten lost or forgotten how to operate machinery.  No sudden changes or any focal weakness.  Does continue to have balance problems and fall every so often  HYPERTENSION Disease Monitoring  Home BP Monitoring (Severity) not checking Symptoms - Chest pain- no    Dyspnea- no Medications (Modifying factors) Compliance-  Daily amlodipin. Lightheadedness-  no  Edema- no Timing - continuous  Duration - years ROS - See HPI  PMH Lab Review   Potassium  Date Value Ref Range Status  09/04/2017 4.3 3.5 - 5.2 mmol/L Final   Sodium  Date Value Ref Range Status  09/04/2017 146 (H) 134 - 144 mmol/L Final   Creat  Date Value Ref Range Status  11/21/2016 0.94 0.70 - 1.18 mg/dL Final    Comment:      For patients > or = 73 years of age: The upper reference limit for Creatinine is approximately 13% higher for people identified as African-American.      Creatinine, Ser  Date Value Ref Range Status  09/04/2017 0.89 0.76 - 1.27 mg/dL Final      Chief Complaint noted Review of Symptoms - see HPI PMH - Smoking status noted.    Objective Vital Signs reviewed BP (!) 144/80   Pulse 68   Temp 98.2 F (36.8 C) (Oral)   Ht 5\' 8"  (1.727 m)   Wt 185 lb 3.2 oz (84 kg)   SpO2 96%   BMI 28.16 kg/m  Psych:  Cognition and judgment appear intact. Alert, communicative  and cooperative with normal attention span and concentration. No apparent delusions, illusions, hallucinations 3/3 objects at 5 minutes Neck:  No deformities, thyromegaly, masses, or tenderness noted.   Supple with full range of motion without pain. Heart - Regular rate and rhythm.  No murmurs, gallops or rubs.    Lungs:  Normal respiratory effort, chest expands symmetrically. Lungs are clear to auscultation, no  crackles or wheezes. Abdomen: soft and non-tender without masses, organomegaly or hernias noted.  No guarding or rebound Extremities:  No cyanosis, edema, or deformity noted with good range of motion of all major joints.   Skin:  Intact without suspicious lesions or rashes  Assessments/Plans  See after visit summary for details of patient instuctions  Memory change Nonfocal exam and he seems mostly intact.  Will check labs and monitor   Essential hypertension Not at goal but was a stressful visit.  Will monitor and check labs.  May need to increase amlodipine

## 2019-01-06 NOTE — Patient Instructions (Signed)
Good to see you today!  Thanks for coming in.  I will call you if your lab tests are not normal.  Otherwise we will discuss them at your next visit.   Come back in one month to go over things.  Keep an eye on the memory especially specific things that happen

## 2019-01-07 LAB — CBC
Hematocrit: 43.7 % (ref 37.5–51.0)
Hemoglobin: 14.1 g/dL (ref 13.0–17.7)
MCH: 28.1 pg (ref 26.6–33.0)
MCHC: 32.3 g/dL (ref 31.5–35.7)
MCV: 87 fL (ref 79–97)
Platelets: 268 10*3/uL (ref 150–450)
RBC: 5.01 x10E6/uL (ref 4.14–5.80)
RDW: 13.4 % (ref 11.6–15.4)
WBC: 7.3 10*3/uL (ref 3.4–10.8)

## 2019-01-07 LAB — CMP14+EGFR
ALT: 25 IU/L (ref 0–44)
AST: 23 IU/L (ref 0–40)
Albumin/Globulin Ratio: 1.8 (ref 1.2–2.2)
Albumin: 4.6 g/dL (ref 3.7–4.7)
Alkaline Phosphatase: 109 IU/L (ref 39–117)
BUN/Creatinine Ratio: 13 (ref 10–24)
BUN: 12 mg/dL (ref 8–27)
Bilirubin Total: 0.9 mg/dL (ref 0.0–1.2)
CO2: 25 mmol/L (ref 20–29)
Calcium: 9.5 mg/dL (ref 8.6–10.2)
Chloride: 99 mmol/L (ref 96–106)
Creatinine, Ser: 0.93 mg/dL (ref 0.76–1.27)
GFR calc Af Amer: 94 mL/min/{1.73_m2} (ref >59)
GFR calc non Af Amer: 81 mL/min/{1.73_m2} (ref >59)
Globulin, Total: 2.6 g/dL (ref 1.5–4.5)
Glucose: 108 mg/dL — ABNORMAL HIGH (ref 65–99)
Potassium: 3.8 mmol/L (ref 3.5–5.2)
Sodium: 142 mmol/L (ref 134–144)
Total Protein: 7.2 g/dL (ref 6.0–8.5)

## 2019-01-07 LAB — TSH: TSH: 2.06 u[IU]/mL (ref 0.450–4.500)

## 2019-01-07 LAB — VITAMIN B12: Vitamin B-12: 329 pg/mL (ref 232–1245)

## 2019-01-07 LAB — FOLATE: Folate: 4.6 ng/mL (ref >3.0)

## 2019-01-13 DIAGNOSIS — Z1212 Encounter for screening for malignant neoplasm of rectum: Secondary | ICD-10-CM | POA: Diagnosis not present

## 2019-01-13 DIAGNOSIS — Z1211 Encounter for screening for malignant neoplasm of colon: Secondary | ICD-10-CM | POA: Diagnosis not present

## 2019-01-13 LAB — COLOGUARD: Cologuard: NEGATIVE

## 2019-01-19 ENCOUNTER — Other Ambulatory Visit: Payer: Self-pay | Admitting: *Deleted

## 2019-01-19 DIAGNOSIS — Z1211 Encounter for screening for malignant neoplasm of colon: Secondary | ICD-10-CM

## 2019-02-03 ENCOUNTER — Ambulatory Visit: Payer: Medicare HMO | Admitting: Family Medicine

## 2019-04-01 ENCOUNTER — Other Ambulatory Visit: Payer: Self-pay | Admitting: Family Medicine

## 2019-06-25 ENCOUNTER — Other Ambulatory Visit: Payer: Self-pay | Admitting: Family Medicine

## 2019-07-08 DIAGNOSIS — H43812 Vitreous degeneration, left eye: Secondary | ICD-10-CM | POA: Diagnosis not present

## 2019-07-08 DIAGNOSIS — Z961 Presence of intraocular lens: Secondary | ICD-10-CM | POA: Diagnosis not present

## 2019-10-30 ENCOUNTER — Other Ambulatory Visit: Payer: Self-pay | Admitting: Family Medicine

## 2020-04-22 ENCOUNTER — Other Ambulatory Visit: Payer: Self-pay | Admitting: Family Medicine

## 2020-05-18 ENCOUNTER — Other Ambulatory Visit: Payer: Self-pay

## 2020-05-18 ENCOUNTER — Ambulatory Visit (INDEPENDENT_AMBULATORY_CARE_PROVIDER_SITE_OTHER): Payer: Medicare HMO | Admitting: Family Medicine

## 2020-05-18 VITALS — BP 126/80 | HR 55 | Wt 177.6 lb

## 2020-05-18 DIAGNOSIS — T148XXA Other injury of unspecified body region, initial encounter: Secondary | ICD-10-CM

## 2020-05-18 DIAGNOSIS — I10 Essential (primary) hypertension: Secondary | ICD-10-CM

## 2020-05-18 DIAGNOSIS — Z23 Encounter for immunization: Secondary | ICD-10-CM

## 2020-05-18 MED ORDER — AMLODIPINE BESYLATE 5 MG PO TABS: 5.0000 mg | ORAL_TABLET | Freq: Every day | ORAL | 3 refills | Status: DC

## 2020-05-18 MED ORDER — TETANUS-DIPHTH-ACELL PERTUSSIS 5-2.5-18.5 LF-MCG/0.5 IM SUSP: 0.5000 mL | Freq: Once | INTRAMUSCULAR | 0 refills | Status: AC

## 2020-05-18 MED ORDER — TETANUS-DIPHTHERIA TOXOIDS TD 5-2 LFU IM INJ: 0.5000 mL | INJECTION | Freq: Once | INTRAMUSCULAR | Status: DC

## 2020-05-18 NOTE — Progress Notes (Addendum)
    SUBJECTIVE:   CHIEF COMPLAINT / HPI:   Daniel [name] is a 74 y.o. male with a history of HTN who presents today for a checkup and medication refill.  HTN Patient reports he is taking his amlodipine every night as prescribed. His daughter states they take BP measurements every day. Patient denies any chest pain, lightheadedness, or edema.  Screw puncture on hand He accidentally punctured his hand between his first and second digits on his L hand with a screw one week ago while working. He states this happened while working in [employer]" outdoors. His daughter treated the wound with iodine, hydrogen peroxide, and bacitracin. It has not given him any issues, and he denies any pain, redness, or drainage.  Dry, bruising skin Patient wonders why his skin is so thin and why it bruises so easily. He notes working outside a lot in the sun. Says his dog will scratch him and make places that are sometimes hard to heal.  PERTINENT  PMH / PSH: HTN  OBJECTIVE:   BP 126/80   Pulse (!) 55   Wt 177 lb 9.6 oz (80.6 kg)   SpO2 94%   BMI 27.00 kg/m    PHYSICAL EXAM  GEN: well developed, well-nourished, in NAD CVS: RRR, normal S1/S2, no murmurs, rubs, gallops RESP: Breathing comfortably on RA, no retractions, wheezes, rhonchi, or crackles SKIN: Solar purpura of the arms; screw puncture wound on L hand in web space between first and second digits, healing without erythema, pain, or drainage EXT: Moves all extremities equally, no edema   ASSESSMENT/PLAN:   No problem-specific Assessment & Plan notes found for this encounter.   HTN [name] HTN is well controlled. Refill for amlodipine sent. Continue taking as prescribed.  Screw puncture on hand Wound seems to be healing well. Sent rx for Tdap booster to pharmacy. Follow up if wound does not continue to heal.  Dry skin and solar purpura [name] arms demonstrate substantial sun damage. Advised to wear sunscreen on arms that is  waterproof and be careful when around pets and work equipment that can damage the skin.  Health maintenance Patient is due for a colonoscopy and COVID-19 vaccine. He denies getting either.  Follow up Come back in 6 months unless otherwise needed.  Welcome   I was present during the important parts of the history physical and decision making and agree with above Lind Covert

## 2020-05-18 NOTE — Patient Instructions (Signed)
Good to see you today!  Thanks for coming in.  Keep taking amlodipine as you are  Wear sun screen every day on your arms and be careful  If the screw injury does not completely heal let me know  Come back in 6 months

## 2020-06-14 DIAGNOSIS — K08409 Partial loss of teeth, unspecified cause, unspecified class: Secondary | ICD-10-CM | POA: Diagnosis not present

## 2020-06-14 DIAGNOSIS — I10 Essential (primary) hypertension: Secondary | ICD-10-CM | POA: Diagnosis not present

## 2020-06-14 DIAGNOSIS — Z9181 History of falling: Secondary | ICD-10-CM | POA: Diagnosis not present

## 2020-06-14 DIAGNOSIS — Z8249 Family history of ischemic heart disease and other diseases of the circulatory system: Secondary | ICD-10-CM | POA: Diagnosis not present

## 2020-06-14 DIAGNOSIS — Z809 Family history of malignant neoplasm, unspecified: Secondary | ICD-10-CM | POA: Diagnosis not present

## 2020-06-14 DIAGNOSIS — Z87891 Personal history of nicotine dependence: Secondary | ICD-10-CM | POA: Diagnosis not present

## 2020-06-14 DIAGNOSIS — G8929 Other chronic pain: Secondary | ICD-10-CM | POA: Diagnosis not present

## 2020-09-06 DIAGNOSIS — Z961 Presence of intraocular lens: Secondary | ICD-10-CM | POA: Diagnosis not present

## 2020-09-06 DIAGNOSIS — H43812 Vitreous degeneration, left eye: Secondary | ICD-10-CM | POA: Diagnosis not present

## 2021-02-20 DIAGNOSIS — Z8249 Family history of ischemic heart disease and other diseases of the circulatory system: Secondary | ICD-10-CM | POA: Diagnosis not present

## 2021-02-20 DIAGNOSIS — Z20822 Contact with and (suspected) exposure to covid-19: Secondary | ICD-10-CM | POA: Diagnosis not present

## 2021-02-20 DIAGNOSIS — Z809 Family history of malignant neoplasm, unspecified: Secondary | ICD-10-CM | POA: Diagnosis not present

## 2021-02-20 DIAGNOSIS — I10 Essential (primary) hypertension: Secondary | ICD-10-CM | POA: Diagnosis not present

## 2021-02-20 DIAGNOSIS — Z87891 Personal history of nicotine dependence: Secondary | ICD-10-CM | POA: Diagnosis not present

## 2021-06-03 ENCOUNTER — Other Ambulatory Visit: Payer: Self-pay | Admitting: Family Medicine

## 2021-07-02 ENCOUNTER — Other Ambulatory Visit: Payer: Self-pay | Admitting: Family Medicine

## 2021-07-29 ENCOUNTER — Other Ambulatory Visit: Payer: Self-pay | Admitting: Family Medicine

## 2021-09-03 ENCOUNTER — Other Ambulatory Visit: Payer: Self-pay | Admitting: Family Medicine

## 2021-09-20 ENCOUNTER — Other Ambulatory Visit: Payer: Self-pay | Admitting: Family Medicine

## 2021-10-02 ENCOUNTER — Other Ambulatory Visit: Payer: Self-pay | Admitting: Family Medicine

## 2021-10-14 ENCOUNTER — Other Ambulatory Visit: Payer: Self-pay | Admitting: Family Medicine

## 2021-10-24 ENCOUNTER — Encounter: Payer: Self-pay | Admitting: Family Medicine

## 2021-10-24 ENCOUNTER — Ambulatory Visit (INDEPENDENT_AMBULATORY_CARE_PROVIDER_SITE_OTHER): Payer: Medicare HMO | Admitting: Family Medicine

## 2021-10-24 ENCOUNTER — Other Ambulatory Visit: Payer: Self-pay

## 2021-10-24 VITALS — BP 192/91 | HR 64 | Wt 191.6 lb

## 2021-10-24 DIAGNOSIS — I10 Essential (primary) hypertension: Secondary | ICD-10-CM

## 2021-10-24 DIAGNOSIS — Z13220 Encounter for screening for lipoid disorders: Secondary | ICD-10-CM | POA: Diagnosis not present

## 2021-10-24 DIAGNOSIS — R296 Repeated falls: Secondary | ICD-10-CM

## 2021-10-24 DIAGNOSIS — Z1211 Encounter for screening for malignant neoplasm of colon: Secondary | ICD-10-CM | POA: Diagnosis not present

## 2021-10-24 MED ORDER — LOSARTAN POTASSIUM 50 MG PO TABS: 50.0000 mg | ORAL_TABLET | Freq: Every day | ORAL | 3 refills | Status: DC

## 2021-10-24 NOTE — Assessment & Plan Note (Signed)
Not at goal.  Will stop amlodipine and try losartan to see if he feels better on this.  Check labs

## 2021-10-24 NOTE — Assessment & Plan Note (Signed)
Stable.  No recent falls

## 2021-10-24 NOTE — Progress Notes (Signed)
    SUBJECTIVE:   CHIEF COMPLAINT / HPI:   Hypertension Has been out of his amlodipine about a week.  Feels better less tired.  No lightheadness or chest pain or shortness of breath.  Grief Wife died about 4 months ago.  It is hard but he is coping by remaining active scrapping and his daughter lives with him and helps out   PERTINENT  PMH / PSH: Not interested in any vaccines  OBJECTIVE:   BP (!) 192/91   Pulse 64   Wt 191 lb 9.6 oz (86.9 kg)   SpO2 97%   BMI 29.13 kg/m   Alert interactive Heart - Regular rate and rhythm.  No murmurs, gallops or rubs.    Lungs:  Normal respiratory effort, chest expands symmetrically. Lungs are clear to auscultation, no crackles or wheezes. Extremities:  No cyanosis, edema, or deformity noted with good range of motion of all major joints.     ASSESSMENT/PLAN:   Essential hypertension Not at goal.  Will stop amlodipine and try losartan to see if he feels better on this.  Check labs   Falling Stable.  No recent falls      Lind Covert, MD [place]

## 2021-10-24 NOTE — Patient Instructions (Signed)
Good to see you today - Thank you for coming in  Things we discussed today:  To screen for colon cancer I have ordered a Cologuard test to be sent to your home.  Please use it and send it in.  If you have questions you can call [phone].      I will call you if your tests are not good.  Otherwise, I will send you a message on MyChart (if it is active) or a letter in the mail..  If you do not hear from me with in 2 weeks please call our office.     Your goal blood pressure is less than 140/90.  Check your blood pressure several times a week.  If regularly higher than this please let me know - either with MyChart or leaving a phone message. Next visit please bring in your blood pressure cuff.     Please always bring your medication bottles  Come back to see me in 6 months

## 2021-10-25 ENCOUNTER — Encounter: Payer: Self-pay | Admitting: Family Medicine

## 2021-10-25 LAB — LIPID PANEL
Chol/HDL Ratio: 2.8 ratio (ref 0.0–5.0)
Cholesterol, Total: 166 mg/dL (ref 100–199)
HDL: 60 mg/dL (ref >39)
LDL Chol Calc (NIH): 91 mg/dL (ref 0–99)
Triglycerides: 80 mg/dL (ref 0–149)
VLDL Cholesterol Cal: 15 mg/dL (ref 5–40)

## 2021-10-25 LAB — CMP14+EGFR
ALT: 20 IU/L (ref 0–44)
AST: 19 IU/L (ref 0–40)
Albumin/Globulin Ratio: 1.8 (ref 1.2–2.2)
Albumin: 4 g/dL (ref 3.7–4.7)
Alkaline Phosphatase: 110 IU/L (ref 44–121)
BUN/Creatinine Ratio: 11 (ref 10–24)
BUN: 11 mg/dL (ref 8–27)
Bilirubin Total: 0.9 mg/dL (ref 0.0–1.2)
CO2: 26 mmol/L (ref 20–29)
Calcium: 9 mg/dL (ref 8.6–10.2)
Chloride: 102 mmol/L (ref 96–106)
Creatinine, Ser: 0.97 mg/dL (ref 0.76–1.27)
Globulin, Total: 2.2 g/dL (ref 1.5–4.5)
Glucose: 102 mg/dL — ABNORMAL HIGH (ref 70–99)
Potassium: 4 mmol/L (ref 3.5–5.2)
Sodium: 140 mmol/L (ref 134–144)
Total Protein: 6.2 g/dL (ref 6.0–8.5)
eGFR: 81 mL/min/{1.73_m2} (ref >59)

## 2021-10-25 NOTE — Addendum Note (Signed)
Addended by: Talbert Cage L on: 10/25/2021 02:08 PM   Modules accepted: Orders

## 2021-11-01 ENCOUNTER — Telehealth: Payer: Self-pay | Admitting: Family Medicine

## 2021-11-01 NOTE — Telephone Encounter (Signed)
Spoke with daughter Glenard Haring He started losartan yesterday No problems Will come in for blood test early next week

## 2022-04-27 ENCOUNTER — Other Ambulatory Visit: Payer: Self-pay | Admitting: Family Medicine

## 2022-07-05 ENCOUNTER — Telehealth: Payer: Self-pay

## 2022-07-05 NOTE — Telephone Encounter (Signed)
Received VM from patient's daughter regarding concerns for dizziness, nausea and vomiting.   Returned call, however, no answer. LVM asking for returned call to office to discuss further.   Talbot Grumbling, RN

## 2022-07-06 NOTE — Telephone Encounter (Signed)
[name] daughter, Levada Dy returns call to nurse line. Reports that patient was experiencing dizziness, nausea and vomiting yesterday along with elevated BP. She reports that he was working outdoors quite a bit yesterday. BP of 150/132, 170/96.   No symptoms today. He has been eating and drinking normally today. They have not taken BP but he states that he does not feel that his BP is elevated. Encouraged patient to rest this weekend and drink plenty of fluids. Also provided with precautions for working out in the heat.   Scheduled patient follow up with PCP on 8/23. Provided with ED precautions.   Talbot Grumbling, RN

## 2022-07-11 ENCOUNTER — Encounter: Payer: Self-pay | Admitting: Family Medicine

## 2022-07-11 ENCOUNTER — Ambulatory Visit (INDEPENDENT_AMBULATORY_CARE_PROVIDER_SITE_OTHER): Payer: Medicare HMO | Admitting: Family Medicine

## 2022-07-11 ENCOUNTER — Other Ambulatory Visit: Payer: Self-pay

## 2022-07-11 DIAGNOSIS — R413 Other amnesia: Secondary | ICD-10-CM

## 2022-07-11 DIAGNOSIS — I10 Essential (primary) hypertension: Secondary | ICD-10-CM | POA: Diagnosis not present

## 2022-07-11 NOTE — Patient Instructions (Addendum)
Good to see you today - Thank you for coming in  Things we discussed today:  BP Cut back on work - aim for no more than 4-5 hours day Keep hydrated - water or Gatorade When it gets hot come home   Your goal blood pressure is less than 140/90.  Check your blood pressure several times a week.  If regularly higher than this please let me know - either with MyChart or leaving a phone message. Next visit please bring in your blood pressure cuff.     I will call you if your tests are not good.  Otherwise, I will send you a message on MyChart (if it is active) or a letter in the mail..  If you do not hear from me with in 2 weeks please call our office.     Please always bring your medication bottles  Come back to see me in one month

## 2022-07-11 NOTE — Assessment & Plan Note (Signed)
Will review further next visit.  May need neuro imaging

## 2022-07-11 NOTE — Progress Notes (Signed)
    SUBJECTIVE:   CHIEF COMPLAINT / HPI:   Hypertension Recent episode of nausea and vomiting and elevated bps at home. He felt he became dehydrated working long hours outside    [place] back to normal now.  Have a blood pressure cuff at home but have not been checking since incident.  Takes losartan every day   Cough For 2 weeks .  Was exposed to smoke.  Feels is improving.  No fever or sputum or chest pain   Falling No recent falls.  Does trip intermittently working on his [place]  Daughter feels this may be worsening.  He is unsure   PERTINENT  PMH / PSH: [place].  Also working for someone else now  OBJECTIVE:   BP (!) 141/94   Pulse 77   Wt 190 lb (86.2 kg)   SpO2 97%   BMI 28.89 kg/m   Heart - Regular rate and rhythm.  No murmurs, gallops or rubs.    Lungs:  Normal respiratory effort, chest expands symmetrically. Lungs are clear to auscultation, no crackles or wheezes. Extremities:  No cyanosis, edema, or deformity noted with good range of motion of all major joints.   Mobility:able to get up and down from exam table without assistance or distress   ASSESSMENT/PLAN:   Essential hypertension BP Readings from Last 3 Encounters:  07/11/22 (!) 141/94  10/24/21 (!) 192/91  05/18/20 126/80   Not a goal today but close.  Will monitor at home and see back in one month  Memory change Will review further next visit.  May need neuro imaging     Patient Instructions  Good to see you today - Thank you for coming in  Things we discussed today:  BP Cut back on work - aim for no more than 4-5 hours day Keep hydrated - water or Gatorade When it gets hot come home   Your goal blood pressure is less than 140/90.  Check your blood pressure several times a week.  If regularly higher than this please let me know - either with MyChart or leaving a phone message. Next visit please bring in your blood pressure cuff.     I will call you  if your tests are not good.  Otherwise, I will send you a message on MyChart (if it is active) or a letter in the mail..  If you do not hear from me with in 2 weeks please call our office.     Please always bring your medication bottles  Come back to see me in one month   Lind Covert, [place]

## 2022-07-11 NOTE — Assessment & Plan Note (Signed)
BP Readings from Last 3 Encounters:  07/11/22 (!) 141/94  10/24/21 (!) 192/91  05/18/20 126/80   Not a goal today but close.  Will monitor at home and see back in one month

## 2022-07-12 ENCOUNTER — Encounter: Payer: Self-pay | Admitting: Family Medicine

## 2022-07-12 LAB — CMP14+EGFR
ALT: 17 IU/L (ref 0–44)
AST: 14 IU/L (ref 0–40)
Albumin/Globulin Ratio: 1.9 (ref 1.2–2.2)
Albumin: 4.2 g/dL (ref 3.8–4.8)
Alkaline Phosphatase: 98 IU/L (ref 44–121)
BUN/Creatinine Ratio: 19 (ref 10–24)
BUN: 23 mg/dL (ref 8–27)
Bilirubin Total: 1 mg/dL (ref 0.0–1.2)
CO2: 21 mmol/L (ref 20–29)
Calcium: 8.8 mg/dL (ref 8.6–10.2)
Chloride: 100 mmol/L (ref 96–106)
Creatinine, Ser: 1.18 mg/dL (ref 0.76–1.27)
Globulin, Total: 2.2 g/dL (ref 1.5–4.5)
Glucose: 110 mg/dL — ABNORMAL HIGH (ref 70–99)
Potassium: 4 mmol/L (ref 3.5–5.2)
Sodium: 137 mmol/L (ref 134–144)
Total Protein: 6.4 g/dL (ref 6.0–8.5)
eGFR: 64 mL/min/{1.73_m2} (ref >59)

## 2022-07-19 ENCOUNTER — Other Ambulatory Visit: Payer: Self-pay

## 2022-07-19 ENCOUNTER — Observation Stay (HOSPITAL_COMMUNITY): Payer: Medicare HMO

## 2022-07-19 ENCOUNTER — Ambulatory Visit (INDEPENDENT_AMBULATORY_CARE_PROVIDER_SITE_OTHER): Payer: Medicare HMO | Admitting: Family Medicine

## 2022-07-19 ENCOUNTER — Telehealth: Payer: Self-pay

## 2022-07-19 ENCOUNTER — Encounter (HOSPITAL_COMMUNITY): Payer: Self-pay

## 2022-07-19 ENCOUNTER — Inpatient Hospital Stay (HOSPITAL_COMMUNITY)
Admission: EM | Admit: 2022-07-19 | Discharge: 2022-07-24 | DRG: 065 | Disposition: A | Discharge status: Rehabilitation Facility | Payer: Medicare HMO | Attending: Family Medicine | Admitting: Family Medicine

## 2022-07-19 ENCOUNTER — Emergency Department (HOSPITAL_COMMUNITY): Payer: Medicare HMO

## 2022-07-19 VITALS — BP 205/120 | HR 57 | Ht 68.0 in | Wt 188.4 lb

## 2022-07-19 DIAGNOSIS — R4781 Slurred speech: Secondary | ICD-10-CM | POA: Diagnosis present

## 2022-07-19 DIAGNOSIS — R42 Dizziness and giddiness: Secondary | ICD-10-CM | POA: Insufficient documentation

## 2022-07-19 DIAGNOSIS — Z8349 Family history of other endocrine, nutritional and metabolic diseases: Secondary | ICD-10-CM

## 2022-07-19 DIAGNOSIS — E785 Hyperlipidemia, unspecified: Secondary | ICD-10-CM | POA: Diagnosis present

## 2022-07-19 DIAGNOSIS — I63311 Cerebral infarction due to thrombosis of right middle cerebral artery: Secondary | ICD-10-CM

## 2022-07-19 DIAGNOSIS — R7303 Prediabetes: Secondary | ICD-10-CM

## 2022-07-19 DIAGNOSIS — Z811 Family history of alcohol abuse and dependence: Secondary | ICD-10-CM

## 2022-07-19 DIAGNOSIS — I10 Essential (primary) hypertension: Secondary | ICD-10-CM | POA: Diagnosis present

## 2022-07-19 DIAGNOSIS — I639 Cerebral infarction, unspecified: Secondary | ICD-10-CM | POA: Diagnosis not present

## 2022-07-19 DIAGNOSIS — Z8249 Family history of ischemic heart disease and other diseases of the circulatory system: Secondary | ICD-10-CM

## 2022-07-19 DIAGNOSIS — R29810 Facial weakness: Secondary | ICD-10-CM | POA: Diagnosis not present

## 2022-07-19 DIAGNOSIS — Z66 Do not resuscitate: Secondary | ICD-10-CM | POA: Diagnosis present

## 2022-07-19 DIAGNOSIS — R29701 NIHSS score 1: Secondary | ICD-10-CM | POA: Diagnosis present

## 2022-07-19 DIAGNOSIS — I6381 Other cerebral infarction due to occlusion or stenosis of small artery: Secondary | ICD-10-CM | POA: Diagnosis not present

## 2022-07-19 DIAGNOSIS — Z8262 Family history of osteoporosis: Secondary | ICD-10-CM

## 2022-07-19 DIAGNOSIS — Z20822 Contact with and (suspected) exposure to covid-19: Secondary | ICD-10-CM | POA: Diagnosis present

## 2022-07-19 DIAGNOSIS — Z83438 Family history of other disorder of lipoprotein metabolism and other lipidemia: Secondary | ICD-10-CM

## 2022-07-19 DIAGNOSIS — Z87891 Personal history of nicotine dependence: Secondary | ICD-10-CM

## 2022-07-19 DIAGNOSIS — R9431 Abnormal electrocardiogram [ECG] [EKG]: Secondary | ICD-10-CM | POA: Diagnosis not present

## 2022-07-19 DIAGNOSIS — Z79899 Other long term (current) drug therapy: Secondary | ICD-10-CM

## 2022-07-19 DIAGNOSIS — Z818 Family history of other mental and behavioral disorders: Secondary | ICD-10-CM

## 2022-07-19 DIAGNOSIS — R531 Weakness: Secondary | ICD-10-CM | POA: Diagnosis not present

## 2022-07-19 DIAGNOSIS — G8194 Hemiplegia, unspecified affecting left nondominant side: Secondary | ICD-10-CM | POA: Diagnosis present

## 2022-07-19 HISTORY — DX: Essential (primary) hypertension: I10

## 2022-07-19 LAB — ECHOCARDIOGRAM COMPLETE
AR max vel: 1.82 cm2
AV Area VTI: 1.66 cm2
AV Area mean vel: 1.67 cm2
AV Mean grad: 5 mmHg
AV Peak grad: 9.7 mmHg
Ao pk vel: 1.56 m/s
Area-P 1/2: 3.12 cm2
Height: 68 in
S' Lateral: 3 cm
Single Plane A4C EF: 47.7 %
Weight: 3014.4 oz

## 2022-07-19 LAB — COMPREHENSIVE METABOLIC PANEL
ALT: 16 U/L (ref 0–44)
AST: 15 U/L (ref 15–41)
Albumin: 3.6 g/dL (ref 3.5–5.0)
Alkaline Phosphatase: 77 U/L (ref 38–126)
Anion gap: 7 (ref 5–15)
BUN: 19 mg/dL (ref 8–23)
CO2: 26 mmol/L (ref 22–32)
Calcium: 9 mg/dL (ref 8.9–10.3)
Chloride: 104 mmol/L (ref 98–111)
Creatinine, Ser: 1.01 mg/dL (ref 0.61–1.24)
GFR, Estimated: >60 mL/min (ref >60)
Glucose, Bld: 103 mg/dL — ABNORMAL HIGH (ref 70–99)
Potassium: 4.1 mmol/L (ref 3.5–5.1)
Sodium: 137 mmol/L (ref 135–145)
Total Bilirubin: 0.8 mg/dL (ref 0.3–1.2)
Total Protein: 6.4 g/dL — ABNORMAL LOW (ref 6.5–8.1)

## 2022-07-19 LAB — CBC
HCT: 38.6 % — ABNORMAL LOW (ref 39.0–52.0)
Hemoglobin: 12.4 g/dL — ABNORMAL LOW (ref 13.0–17.0)
MCH: 28.4 pg (ref 26.0–34.0)
MCHC: 32.1 g/dL (ref 30.0–36.0)
MCV: 88.5 fL (ref 80.0–100.0)
Platelets: 228 10*3/uL (ref 150–400)
RBC: 4.36 MIL/uL (ref 4.22–5.81)
RDW: 13.1 % (ref 11.5–15.5)
WBC: 7.2 10*3/uL (ref 4.0–10.5)
nRBC: 0 % (ref 0.0–0.2)

## 2022-07-19 LAB — RAPID URINE DRUG SCREEN, HOSP PERFORMED
Amphetamines: NOT DETECTED
Barbiturates: NOT DETECTED
Benzodiazepines: NOT DETECTED
Cocaine: NOT DETECTED
Opiates: NOT DETECTED
Tetrahydrocannabinol: NOT DETECTED

## 2022-07-19 LAB — URINALYSIS, ROUTINE W REFLEX MICROSCOPIC
Bilirubin Urine: NEGATIVE
Glucose, UA: NEGATIVE mg/dL
Hgb urine dipstick: NEGATIVE
Ketones, ur: NEGATIVE mg/dL
Leukocytes,Ua: NEGATIVE
Nitrite: NEGATIVE
Protein, ur: NEGATIVE mg/dL
Specific Gravity, Urine: 1.008 (ref 1.005–1.030)
pH: 7 (ref 5.0–8.0)

## 2022-07-19 LAB — APTT: aPTT: 24 seconds (ref 24–36)

## 2022-07-19 LAB — DIFFERENTIAL
Abs Immature Granulocytes: 0.03 10*3/uL (ref 0.00–0.07)
Basophils Absolute: 0 10*3/uL (ref 0.0–0.1)
Basophils Relative: 1 %
Eosinophils Absolute: 0.4 10*3/uL (ref 0.0–0.5)
Eosinophils Relative: 5 %
Immature Granulocytes: 0 %
Lymphocytes Relative: 22 %
Lymphs Abs: 1.6 10*3/uL (ref 0.7–4.0)
Monocytes Absolute: 0.4 10*3/uL (ref 0.1–1.0)
Monocytes Relative: 5 %
Neutro Abs: 4.8 10*3/uL (ref 1.7–7.7)
Neutrophils Relative %: 67 %

## 2022-07-19 LAB — RESP PANEL BY RT-PCR (FLU A&B, COVID) ARPGX2
Influenza A by PCR: NEGATIVE
Influenza B by PCR: NEGATIVE
SARS Coronavirus 2 by RT PCR: NEGATIVE

## 2022-07-19 LAB — I-STAT CHEM 8, ED
BUN: 19 mg/dL (ref 8–23)
Calcium, Ion: 1.17 mmol/L (ref 1.15–1.40)
Chloride: 102 mmol/L (ref 98–111)
Creatinine, Ser: 0.9 mg/dL (ref 0.61–1.24)
Glucose, Bld: 98 mg/dL (ref 70–99)
HCT: 37 % — ABNORMAL LOW (ref 39.0–52.0)
Hemoglobin: 12.6 g/dL — ABNORMAL LOW (ref 13.0–17.0)
Potassium: 4 mmol/L (ref 3.5–5.1)
Sodium: 137 mmol/L (ref 135–145)
TCO2: 26 mmol/L (ref 22–32)

## 2022-07-19 LAB — HEMOGLOBIN A1C
Hgb A1c MFr Bld: 6.1 % — ABNORMAL HIGH (ref 4.8–5.6)
Mean Plasma Glucose: 128.37 mg/dL

## 2022-07-19 LAB — LIPID PANEL
Cholesterol: 166 mg/dL (ref 0–200)
HDL: 65 mg/dL (ref >40)
LDL Cholesterol: 87 mg/dL (ref 0–99)
Total CHOL/HDL Ratio: 2.6 RATIO
Triglycerides: 70 mg/dL (ref <150)
VLDL: 14 mg/dL (ref 0–40)

## 2022-07-19 LAB — PROTIME-INR
INR: 1 (ref 0.8–1.2)
Prothrombin Time: 12.9 seconds (ref 11.4–15.2)

## 2022-07-19 LAB — ETHANOL: Alcohol, Ethyl (B): <10 mg/dL (ref <10)

## 2022-07-19 LAB — CBG MONITORING, ED: Glucose-Capillary: 81 mg/dL (ref 70–99)

## 2022-07-19 MED ORDER — ASPIRIN 81 MG PO TBEC
81.0000 mg | DELAYED_RELEASE_TABLET | Freq: Every day | ORAL | Status: DC
  Administered 2022-07-20 – 2022-07-24 (×5): 81 mg via ORAL
  Filled 2022-07-19 (×5): qty 1

## 2022-07-19 MED ORDER — GADOBUTROL 1 MMOL/ML IV SOLN
8.5000 mL | Freq: Once | INTRAVENOUS | Status: AC | PRN
Start: 2022-07-19 — End: 2022-07-19
  Administered 2022-07-19: 8.5 mL via INTRAVENOUS

## 2022-07-19 MED ORDER — ROSUVASTATIN CALCIUM 20 MG PO TABS
20.0000 mg | ORAL_TABLET | Freq: Every day | ORAL | Status: DC
  Administered 2022-07-19 – 2022-07-24 (×6): 20 mg via ORAL
  Filled 2022-07-19 (×6): qty 1

## 2022-07-19 MED ORDER — ACETAMINOPHEN 325 MG PO TABS: 650.0000 mg | ORAL_TABLET | ORAL | Status: DC | PRN

## 2022-07-19 MED ORDER — ACETAMINOPHEN 650 MG RE SUPP: 650.0000 mg | RECTAL | Status: DC | PRN

## 2022-07-19 MED ORDER — CLOPIDOGREL BISULFATE 75 MG PO TABS
75.0000 mg | ORAL_TABLET | Freq: Every day | ORAL | Status: DC
  Administered 2022-07-20 – 2022-07-24 (×5): 75 mg via ORAL
  Filled 2022-07-19 (×5): qty 1

## 2022-07-19 MED ORDER — CLOPIDOGREL BISULFATE 300 MG PO TABS
300.0000 mg | ORAL_TABLET | Freq: Once | ORAL | Status: AC
  Administered 2022-07-19: 300 mg via ORAL
  Filled 2022-07-19: qty 1

## 2022-07-19 MED ORDER — STROKE: EARLY STAGES OF RECOVERY BOOK
Freq: Once | Status: AC
Start: 2022-07-20 — End: 2022-07-20
  Filled 2022-07-19: qty 1

## 2022-07-19 MED ORDER — ASPIRIN 325 MG PO TABS: 325.0000 mg | ORAL_TABLET | Freq: Once | ORAL | Status: DC

## 2022-07-19 MED ORDER — ENOXAPARIN SODIUM 40 MG/0.4ML IJ SOSY
40.0000 mg | PREFILLED_SYRINGE | INTRAMUSCULAR | Status: DC
  Administered 2022-07-19 – 2022-07-23 (×5): 40 mg via SUBCUTANEOUS
  Filled 2022-07-19 (×6): qty 0.4

## 2022-07-19 MED ORDER — SENNOSIDES-DOCUSATE SODIUM 8.6-50 MG PO TABS: 1.0000 | ORAL_TABLET | Freq: Every evening | ORAL | Status: DC | PRN

## 2022-07-19 MED ORDER — ASPIRIN 325 MG PO TABS
325.0000 mg | ORAL_TABLET | Freq: Every day | ORAL | Status: DC
  Administered 2022-07-19: 325 mg via ORAL
  Filled 2022-07-19: qty 1

## 2022-07-19 MED ORDER — ACETAMINOPHEN 160 MG/5ML PO SOLN: 650.0000 mg | ORAL | Status: DC | PRN

## 2022-07-19 NOTE — Progress Notes (Signed)
FMTS Interim Progress Note  S: Patient resting comfortably.  No new complaints.  Denies new onset symptoms  O: BP (!) 171/86 (BP Location: Left Arm)   Pulse (!) 50   Temp (!) 97.5 F (36.4 C) (Oral)   Resp 14   SpO2 97%   Well-appearing, no acute distress Cardio: Regular rate, regular rhythm, no murmurs Pulm: Clear, no crackles, no consolidation, no wheezing.  No increased work of breathing Neuro: No facial droop, no dysarthria, eye movements intact strength equal in upper extremities Minor decreased strength in the left lower extremity 4-5/5.  A/P: CVA: - Pending neuro recs - PT/OT recs - Optimizing medication for secondary stroke prevention  Darci Current, DO 07/19/2022, 9:04 PM PGY-1, [facility] Service pager [phone]

## 2022-07-19 NOTE — ED Notes (Signed)
Pt to be transported to floor from MRI; receiving RN, Quillian Quince, aware and agreeable to plan

## 2022-07-19 NOTE — ED Notes (Signed)
Patient transported to CT 

## 2022-07-19 NOTE — H&P (Signed)
Hospital Admission History and Physical Service Pager: [phone]  Patient name: Daniel [name] Medical record number: [id number] Date of Birth: 1946/02/24 Age: 76 y.o. Gender: male  Primary Care Provider: Lind Covert, MD Consultants: Neurology  Code Status: DNR Preferred Emergency Contact:  Contact Information     Name Relation Home Work Mobile   Tool,Angela Daughter   [phone]   Nimrod, Wendt   [phone]      Chief Complaint: Dizziness, "walking like a drunk man"  Assessment and Plan: Daniel [name] is a 76 y.o. 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 Neurology consulted in the ED. He is out of window for intervention but will be admitted for additional work up.   * CVA (cerebral vascular accident) [facility]) Presented out of window for intervention. Sx onset appears to be yesterday afternoon, per daughter. Neuro consulted and we appreciate their care and recommendations. Neurologic examination largely unremarkable. Will admit for further CVA work up. Risk stratifications so far notable for LDL 87, A1c 6.1%.  -Admit to progressive, FMTS, attending Dr. Ardelia Mems -Neuro following, appreciate care and recommendations -S/p ASA 325 mg, Plavix 300 mg x1 -Start DAPT with ASA 81 mg, Plavix 75 mg tomorrow -Heart healthy diet (passed bedside swallow) -PT/OT recommendations -Continuous cardiac monitoring -Echo -MRI brain w/o -MRA Head w/o -U/S Carotid  -Start high-intensity statin  -Allow for permissive HTN -Neuro checks q4h   Prediabetes Hgb A1c 6.1%. -Starting statin as above for secondary prevention given CVA -Lifestyle modifications   Essential hypertension On Losartan 50 mg at home. BP in office was >474 systolic.  -Hold Losartan to allow for permissive HTN -Await Neuro recommendations    FEN/GI: Heart healthy  VTE Prophylaxis: Lovenox   Disposition: Progressive    History of Present Illness:  Daniel [name] is a 76 y.o. male presenting with acute dizziness.   States that he went to [place] about a week or two ago. Felt sort of dizzy when he was walking. States that he went back to the house to sit down for an hour until the dizziness passed. Then he tried to get up again but he couldn't hardly stand up. Told a family member that something was wrong- he had a family member called the clinic for an appointment.   States that he was taking a little red pill from [facility] for his cough. Felt like maybe that was causing his dizziness.   Denies hx of stroke or heart problems.   In the ED, a code stroke was not called as patient was deemed outside of intervention window.  CT head was obtained which showed age-indeterminate right thalamic stroke, MRI brain ordered neurology consulted. Given 325 mg of ASA, 300 mg Plavix. BP elevated 259-563O systolic.    Review Of Systems: Per HPI with the following additions: Denies fever, chills, headache, dizziness, lightheadedness, chest pain, abdominal pain, dysuria. Endorses shortness of breath.   Pertinent Past Medical History: HTN   Remainder reviewed in history tab.   Pertinent Past Surgical History: Triple hernia operation    Remainder reviewed in history tab.  Pertinent Social History: Tobacco use: Former, in his 51s Alcohol use: None Other Substance use: No  Lives with daughter and granddaughter   Pertinent Family History: Mother: diabetes, cancer  Father: cancer   Remainder reviewed in history tab.   Important Outpatient Medications: Losartan 50 mg  Remainder reviewed in medication history.   Objective: BP Marland Kitchen)  178/86   Pulse (!) 51   Temp 97.7 F (36.5 C) (Oral)   Resp 17   SpO2 97%  Exam: General: Awake, alert, pleasant, in no acute distress Eyes: EOMI, sclera anicteric  ENTM: Edentulous, dry mucous membranes Neck: Supple Cardiovascular: sinus bradycardia, regular rhythm  Respiratory:  CTAB, normal work of breathing  Gastrointestinal: soft, non-distended, non-tender  MSK: Moving all extremities spontaneously  Derm: Skin is thin, bruising to left forearm Neuro:  Cranial Nerves: II: Visual Fields are full. PERRL.  III,IV, VI: EOMI without ptosis or diplopia.  V: Facial sensation is symmetric to temperature VII: Facial movement is symmetric.  VIII: hard of hearing b/l X: Palate elevates symmetrically XI: Shoulder shrug is symmetric. XII: tongue is midline without atrophy or fasciculations.  Motor: Tone is normal. Bulk is normal. 5/5 strength was present in all four extremities.  Cerebellar: FNF, alternating hand movements and HKS are intact bilaterally Psych: Normal mood and affect   Labs:  CBC BMET  Recent Labs  Lab 07/19/22 1430 07/19/22 1441  WBC 7.2  --   HGB 12.4* 12.6*  HCT 38.6* 37.0*  PLT 228  --    Recent Labs  Lab 07/19/22 1430 07/19/22 1441  NA 137 137  K 4.1 4.0  CL 104 102  CO2 26  --   BUN 19 19  CREATININE 1.01 0.90  GLUCOSE 103* 98  CALCIUM 9.0  --     Pertinent additional labs hemoglobin A1c 6.1%, UDS negative, LDL 87, ethanol less than 10.  EKG: Sinus bradycardia, rate 54 bpm    Imaging Studies Performed: CT HEAD WO CONTRAST  IMPRESSION:  1. Small focus of hypodensity in the right thalamic capsular region is age indeterminate but could reflect a small acute or subacute infarct. Consider brain MRI for further evaluation.  2. No evidence of large vessel territorial infarct or hemorrhage.   Sharion Settler, DO 07/19/2022, 6:04 PM PGY-3, [facility] pager: [phone], text pages welcome Secure chat group [place]

## 2022-07-19 NOTE — Assessment & Plan Note (Addendum)
Hgb A1c 6.1%. -Continue statin for secondary prevention given CVA -Encourage continued lifestyle modifications for improved preventative measures

## 2022-07-19 NOTE — Progress Notes (Signed)
    SUBJECTIVE:   CHIEF COMPLAINT / HPI:   Dizziness  Patient presents with daughter and granddaughter today for complaint of acute onset dizziness.  Started earlier today while he was in [place].  Unclear exact time.  Possibly less than 4 hours ago.  He reports he all of a sudden could not walk straight and was dragging his left foot.  When he got home, he could barely get up the 3 front steps with the use of handrails.  Daughter noticed something was off and called into nurse line to get an appointment.  Over the phone line, it was only conveyed that he was dizzy and may have had heat exhaustion last week.  Once he arrived here, daughter informed nurse that he was also dragging his left foot earlier.  Per patient and daughter, speech and mentation are normal at this time.  No facial drooping or slurring of words.  He does take losartan 50 mg nightly, last taken last night.  No extra or missed doses per patient.  No known history of CAD, no prior strokes or MI.  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    OBJECTIVE:   BP (!) 205/120   Pulse (!) 57   Ht '5\' 8"'$  (1.727 m)   Wt 188 lb 6.4 oz (85.5 kg)   SpO2 98%   BMI 28.65 kg/m    General: Awake, alert, oriented, no acute distress HEENT: Speech somewhat slurred, but per patient and daughter at baseline Respiratory: Speaking fluently in full sentences, no respiratory distress Gait: Patient appears unsteady on his feet, no left foot drag on exam  Romberg testing positive  ASSESSMENT/PLAN:   Dizziness With acute onset dizziness, left foot dragging earlier, positive Romberg, and severely elevated blood pressure in clinic, I am highly concerned for a basilar stroke.  Discussed with patient and daughter going  immediately to the emergency room.  Unclear if he is still in the 4-hour tPA window, as patient reports he was alone in [place] earlier when he started to walk off balance.  I personally called ahead to give report to the charge nurse Megan.  [facility] RN April wheeling patient over in wheelchair immediately given that hospital is across the street.  Precepted with attending Dr. Domenic Schwab.     Ezequiel Essex, MD [place]

## 2022-07-19 NOTE — Progress Notes (Signed)
  Echocardiogram 2D Echocardiogram has been performed.  Daniel [name] 07/19/2022, 5:49 PM

## 2022-07-19 NOTE — ED Notes (Signed)
Pt carrying handgun in jean pocket- gun given to security; pt's daughter Ilay Capshaw) to pick up handgun and take home

## 2022-07-19 NOTE — Telephone Encounter (Signed)
[name] daughter calls nurse line regarding patient experiencing dizzy spells. States that he will have dizzy episode and begin to have issues with his balance.   These symptoms started yesterday evening. Patient reports that he has been drinking plenty of fluids, however, continues to have dizzy spells. Most recent BP around 1100- 173/89  Denies blurry vision, CP, SHOB, arm weakness, facial drooping or slurred speech.   Daughter has concerns that he may have vertigo. Scheduled appointment this afternoon for further evaluation.   Talbot Grumbling, RN

## 2022-07-19 NOTE — Consult Note (Signed)
Neurology Consultation  Reason for Consult: stroke on CT Referring Physician: Ardelia Mems  CC: dizziness  History is obtained from:Patient, daughter, chart review  HPI: Daniel [name] is a 76 y.o. male with a past medical history of HTN presenting with left lower extremity weakness and dizziness. He states that he "felt like a drunk man". He was in his usual state of health yesterday when he went to run some errands. He was at [facility] when he became dizzy. He saw his primary care provider today and was sent to the ED for a stroke work up after he was found to have a positive Romberg, severe HTN, left leg weakness, and dizziness in the clinic. He denies recent nausea, vomiting, chills, fever, chest pain, palpitations, or falls. He has never had headaches, stroke, or TIA symptoms in the past and has never seen a neurologist. He is compliant with his losartan and that is the only medication he is prescribed.   He was seen 07/11/2022 for HTN and nausea by his PCP as well. He stated at that visit that he had been working long hours outside and thought he was just dehydrated. He had returned to baseline in between these two episodes.   LKW: 1400 07/18/2022 tpa given?: no, outside of window Premorbid modified Rankin scale (mRS): 0   ROS: Full ROS was performed and is negative except as noted in the HPI.  Past Medical History:  Diagnosis Date   Hernia of fascia 2008   repair with many complications    Hypertension     Family History  Problem Relation Age of Onset   Hypertension Mother    Osteoporosis Mother    Thyroid disease Mother    Alcohol abuse Father    Depression Sister    Hypertension Sister    High Cholesterol Sister    Hypertension Brother     Social History:   reports that he has quit smoking. He has never used smokeless tobacco. He reports that he does not drink alcohol and does not use drugs.  Medications  Current Facility-Administered Medications:    [START ON 07/20/2022]   stroke: early stages of recovery book, , Does not apply, Once, Espinoza, Alejandra, DO   acetaminophen (TYLENOL) tablet 650 mg, 650 mg, Oral, Q4H PRN **OR** [DISCONTINUED] acetaminophen (TYLENOL) 160 MG/5ML solution 650 mg, 650 mg, Per Tube, Q4H PRN **OR** acetaminophen (TYLENOL) suppository 650 mg, 650 mg, Rectal, Q4H PRN, Nita Sells, Alejandra, DO   [START ON 07/20/2022] aspirin EC tablet 81 mg, 81 mg, Oral, Daily, Espinoza, Alejandra, DO   aspirin tablet 325 mg, 325 mg, Oral, Daily, Espinoza, Alejandra, DO, 325 mg at 07/19/22 1600   [START ON 07/20/2022] clopidogrel (PLAVIX) tablet 75 mg, 75 mg, Oral, Daily, Espinoza, Alejandra, DO   enoxaparin (LOVENOX) injection 40 mg, 40 mg, Subcutaneous, Q24H, Espinoza, Alejandra, DO   senna-docusate (Senokot-S) tablet 1 tablet, 1 tablet, Oral, QHS PRN, Sharion Settler, DO  Current Outpatient Medications:    acetaminophen (TYLENOL) 650 MG CR tablet, Take 650 mg by mouth at bedtime., Disp: , Rfl:    losartan (COZAAR) 50 MG tablet, Take 1 tablet (50 mg total) by mouth at bedtime., Disp: 90 tablet, Rfl: 3   Exam: Current vital signs: BP (!) 165/81   Pulse (!) 59   Temp 97.7 F (36.5 C) (Oral)   Resp 16   SpO2 99%  Vital signs in last 24 hours: Temp:  [97.7 F (36.5 C)] 97.7 F (36.5 C) (08/31 1423) Pulse Rate:  [39-59] 59 (  08/31 1630) Resp:  [14-19] 16 (08/31 1630) BP: (165-205)/(77-120) 165/81 (08/31 1600) SpO2:  [97 %-99 %] 99 % (08/31 1630) Weight:  [85.5 kg] 85.5 kg (08/31 1343)  GENERAL: Awake, alert in NAD HEENT: - Normocephalic and atraumatic, dry mm, no LN++, no Thyromegally LUNGS - Clear to auscultation bilaterally with no wheezes CV - S1S2 RRR, no m/r/g, equal pulses bilaterally. ABDOMEN - Soft, nontender, nondistended with normoactive BS Ext: warm, well perfused, intact peripheral pulses, no edema  NEURO:  Mental Status: AA&Ox3  Language: speech is clear.  Naming, repetition, fluency, and comprehension intact. Cranial Nerves:  PERRL 67m/brisk. EOMI, visual fields full, no facial asymmetry, facial sensation intact, hearing intact, tongue/uvula/soft palate midline, normal sternocleidomastoid and trapezius muscle strength. No evidence of tongue atrophy or fibrillations Motor: All extremities 5/5 strength Tone: is normal and bulk is normal Sensation- Intact to light touch bilaterally Coordination: FTN intact bilaterally, LLE minimally ataxic with HKS Gait- deferred  NIHSS 1   Labs I have reviewed labs in epic and the results pertinent to this consultation are:  CBC    Component Value Date/Time   WBC 7.2 07/19/2022 1430   RBC 4.36 07/19/2022 1430   HGB 12.6 (L) 07/19/2022 1441   HGB 14.1 01/06/2019 1218   HCT 37.0 (L) 07/19/2022 1441   HCT 43.7 01/06/2019 1218   PLT 228 07/19/2022 1430   PLT 268 01/06/2019 1218   MCV 88.5 07/19/2022 1430   MCV 87 01/06/2019 1218   MCH 28.4 07/19/2022 1430   MCHC 32.1 07/19/2022 1430   RDW 13.1 07/19/2022 1430   RDW 13.4 01/06/2019 1218   LYMPHSABS 1.6 07/19/2022 1430   MONOABS 0.4 07/19/2022 1430   EOSABS 0.4 07/19/2022 1430   BASOSABS 0.0 07/19/2022 1430    CMP     Component Value Date/Time   NA 137 07/19/2022 1441   NA 137 07/11/2022 1044   K 4.0 07/19/2022 1441   CL 102 07/19/2022 1441   CO2 26 07/19/2022 1430   GLUCOSE 98 07/19/2022 1441   BUN 19 07/19/2022 1441   BUN 23 07/11/2022 1044   CREATININE 0.90 07/19/2022 1441   CREATININE 0.94 11/21/2016 1059   CALCIUM 9.0 07/19/2022 1430   PROT 6.4 (L) 07/19/2022 1430   PROT 6.4 07/11/2022 1044   ALBUMIN 3.6 07/19/2022 1430   ALBUMIN 4.2 07/11/2022 1044   AST 15 07/19/2022 1430   ALT 16 07/19/2022 1430   ALKPHOS 77 07/19/2022 1430   BILITOT 0.8 07/19/2022 1430   BILITOT 1.0 07/11/2022 1044   GFRNONAA >60 07/19/2022 1430   GFRAA 94 01/06/2019 1218    Lipid Panel     Component Value Date/Time   CHOL 166 10/24/2021 1132   TRIG 80 10/24/2021 1132   HDL 60 10/24/2021 1132   CHOLHDL 2.8 10/24/2021  1132   LDLCALC 91 10/24/2021 1132     Imaging I have reviewed the images obtained:  CT-head- Small focus of hypodensity in the right thalamic capsular region is age indeterminate but could reflect a small acute or subacute infarct.   MRI examination of the brain- pending  Assessment:  76y.o. male with a past medical history of HTN presenting with left lower extremity weakness and dizziness. 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 He has been started on ASA '81mg'$  and Plavix '75mg'$ .   Recommendations: - HgbA1c, fasting lipid panel - MRI, MRA  of the brain without contrast - Carotid [place]- Frequent neuro checks -  Echocardiogram - Prophylactic therapy-Antiplatelet med: Aspirin 81 and plavix '75mg'$   - Risk factor modification - Telemetry monitoring - PT consult, OT consult, Speech consult - Stroke team to follow    Patient seen and examined by NP/APP with MD. MD to update note as needed.   Janine Ores, DNP, FNP-BC [facility] Pager: [phone]   NEUROHOSPITALIST ADDENDUM Performed a face to face diagnostic evaluation.   I have reviewed the contents of history and physical exam as documented by PA/ARNP/Resident and agree with above documentation.  I have discussed and formulated the above plan as documented. Edits to the note have been made as needed.  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 Stroke workup as above.  Donnetta Simpers, MD [facility] [phone]   If 7pm to 7am, please call on call as listed on AMION.

## 2022-07-19 NOTE — Hospital Course (Addendum)
Daniel [name] is a 75 y.o. 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   * CVA (cerebral vascular accident) ([place]) MRI showed small acute infarct right centrum semiovale. Risk stratifications so far notable for LDL 87, A1c 6.1%.  -- S/p ASA 325 mg, Plavix 300 mg x1 -- DAPT with ASA 81 mg, Plavix 75 mg today -- Rosuvastatin 20 mg daily    Essential hypertension -- Losartan 50 mg -- Allowed for permissive HTN <220/120  Follow up  Inpatient rehab with PT  Recommend heart healthy diet

## 2022-07-19 NOTE — ED Notes (Signed)
Echo at bedside

## 2022-07-19 NOTE — ED Triage Notes (Signed)
Pt got up yesterday, felt good; 1400 yesterday pt ran errand, while walking into store pt began feeling dizzy; went home; still feeling dizzy this am, states he was "stumbling like a drunk man"; pt states daughter noticed him dragging L foot; pt endorses weakness In L leg

## 2022-07-19 NOTE — Assessment & Plan Note (Addendum)
On Losartan 50 mg at home. BP in office was >953 systolic.  -- Hold Losartan to allow for permissive HTN -- Await Neuro recommendations

## 2022-07-19 NOTE — ED Provider Notes (Signed)
[facility] EMERGENCY DEPARTMENT Provider Note   CSN: [id number] Arrival date & time: 07/19/22  1359     History  Chief Complaint  Patient presents with   Dizziness    Daniel [name] is a 76 y.o. male.   Dizziness    Patient with medical history of hypertension presents today due to dizziness and unsteady gait.  Started around 1400 yesterday, he was at [facility] and unable to walk steadily and was dragging his left foot.  He felt dizzy which she describes as "just not right", denies feeling of the room spinning or feeling presyncopal.  Feels like he just cannot walk because he feels "like I am a drunk man".  No history of previous stroke or blood clot, not on anticoagulation.  Seen by his primary today, he had a positive Romberg sent to ED for TIA/stroke work-up.  Patient states he feels like he cannot walk because he feels unsteady, he denies any chest pain, vision changes, numbness, back pain, neck pain.    He does endorse some headache.  He is on antihypertensive medicine which she takes daily and took today.  Home Medications Prior to Admission medications   Medication Sig Start Date End Date Taking? Authorizing Provider  losartan (COZAAR) 50 MG tablet Take 1 tablet (50 mg total) by mouth at bedtime. 10/24/21  Yes Lind Covert, MD      Allergies    Patient has no known allergies.    Review of Systems   Review of Systems  Neurological:  Positive for dizziness.    Physical Exam Updated Vital Signs BP (!) 165/81   Pulse (!) 59   Temp 97.7 F (36.5 C) (Oral)   Resp 16   SpO2 99%  Physical Exam Vitals and nursing note reviewed. Exam conducted with a chaperone present.  Constitutional:      Appearance: Normal appearance.  HENT:     Head: Normocephalic and atraumatic.  Eyes:     General: No scleral icterus.       Right eye: No discharge.        Left eye: No discharge.     Extraocular Movements: Extraocular movements intact.     Pupils:  Pupils are equal, round, and reactive to light.  Cardiovascular:     Rate and Rhythm: Regular rhythm. Bradycardia present.     Pulses: Normal pulses.     Heart sounds: Normal heart sounds. No murmur heard.    No friction rub. No gallop.  Pulmonary:     Effort: Pulmonary effort is normal. No respiratory distress.     Breath sounds: Normal breath sounds.  Abdominal:     General: Abdomen is flat. Bowel sounds are normal. There is no distension.     Palpations: Abdomen is soft.     Tenderness: There is no abdominal tenderness.  Musculoskeletal:     Cervical back: No tenderness.  Skin:    General: Skin is warm and dry.     Coloration: Skin is not jaundiced.  Neurological:     Mental Status: He is alert. Mental status is at baseline.     Coordination: Coordination normal.     Comments: Cranial nerves II through XII are grossly intact.  No dysarthria.  Grip strength symmetric bilaterally, no pronator drift.  Lower extremity strength symmetric, no foot drop on my exam.  Slightly ataxic left lower extremity heel-to-shin.  Dorsiflexion and plantarflexion 5/5 and symmetric.     ED Results / Procedures / Treatments  Labs (all labs ordered are listed, but only abnormal results are displayed) Labs Reviewed  CBC - Abnormal; Notable for the following components:      Result Value   Hemoglobin 12.4 (*)    HCT 38.6 (*)    All other components within normal limits  COMPREHENSIVE METABOLIC PANEL - Abnormal; Notable for the following components:   Glucose, Bld 103 (*)    Total Protein 6.4 (*)    All other components within normal limits  URINALYSIS, ROUTINE W REFLEX MICROSCOPIC - Abnormal; Notable for the following components:   Color, Urine STRAW (*)    All other components within normal limits  I-STAT CHEM 8, ED - Abnormal; Notable for the following components:   Hemoglobin 12.6 (*)    HCT 37.0 (*)    All other components within normal limits  RESP PANEL BY RT-PCR (FLU A&B, COVID) ARPGX2   ETHANOL  PROTIME-INR  APTT  DIFFERENTIAL  RAPID URINE DRUG SCREEN, HOSP PERFORMED  LIPID PANEL  HEMOGLOBIN A1C  CBG MONITORING, ED    EKG None  Radiology CT HEAD WO CONTRAST  Result Date: 07/19/2022 CLINICAL DATA:  Dizziness and left leg weakness EXAM: CT HEAD WITHOUT CONTRAST TECHNIQUE: Contiguous axial images were obtained from the base of the skull through the vertex without intravenous contrast. RADIATION DOSE REDUCTION: This exam was performed according to the departmental dose-optimization program which includes automated exposure control, adjustment of the mA and/or kV according to patient size and/or use of iterative reconstruction technique. COMPARISON:  None Available. FINDINGS: Brain: There is hypodensity in the right thalamic capsular region consistent with age-indeterminate but possibly acute or subacute infarct. There is no evidence of large vessel territorial infarct. There is no acute intracranial hemorrhage or extra-axial fluid collection. There are remote appearing infarcts in the left cerebral hemisphere white matter and mild background chronic small vessel ischemic change. Parenchymal volume is normal for age. The ventricles are normal in size. There is no mass lesion.  There is no mass effect or midline shift. Vascular: There is calcification of the bilateral carotid siphons and vertebral arteries. Skull: Normal. Negative for fracture or focal lesion. Sinuses/Orbits: There is mild mucosal thickening in the paranasal sinuses. Bilateral lens implants are in place. The globes and orbits are otherwise unremarkable. Other: None. IMPRESSION: 1. Small focus of hypodensity in the right thalamic capsular region is age indeterminate but could reflect a small acute or subacute infarct. Consider brain MRI for further evaluation. 2. No evidence of large vessel territorial infarct or hemorrhage. Electronically Signed   By: Valetta Mole M.D.   On: 07/19/2022 14:56    Procedures Procedures     Medications Ordered in ED Medications  aspirin tablet 325 mg (325 mg Oral Given 07/19/22 1600)   stroke: early stages of recovery book (has no administration in time range)  acetaminophen (TYLENOL) tablet 650 mg (has no administration in time range)    Or  acetaminophen (TYLENOL) suppository 650 mg (has no administration in time range)  senna-docusate (Senokot-S) tablet 1 tablet (has no administration in time range)  enoxaparin (LOVENOX) injection 40 mg (has no administration in time range)  aspirin EC tablet 81 mg (has no administration in time range)  clopidogrel (PLAVIX) tablet 75 mg (has no administration in time range)  clopidogrel (PLAVIX) tablet 300 mg (300 mg Oral Given 07/19/22 1600)    ED Course/ Medical Decision Making/ A&P Clinical Course as of 07/19/22 1705  Thu Jul 19, 2022  1531 CT HEAD WO CONTRAST Age  indeterminate right thalmic stroke. MR brain ordered. Neurology consulted. Will admit to family for stroke workup [HS]  1540 Blood pressure rechecked, 157/87. [HS]  7510 I spoke with Dr. Leonel Ramsay with neurology.  History and presentation consistent with stroke, CT also supportive.  Agrees with MRI brain with and without.  Advises admission to hospitalist, neurology will consult.  Given no history of bleeding issues we will give 325 of aspirin and 300 Plavix per neurology recommendations. [HS]    Clinical Course User Index [HS] Sherrill Raring, PA-C                           Medical Decision Making Amount and/or Complexity of Data Reviewed Labs: ordered. Radiology: ordered. Decision-making details documented in ED Course.  Risk Prescription drug management. Decision regarding hospitalization.   Patient presents due to ataxia.  Differential includes not limited to stroke, TIA, vertigo, arhythmia   Patient is hypertensive and bradycardic at 49.  Lungs clear to auscultation, regular rhythm.  Neuro exam is as documented above.  Notably ataxia to the left lower  extremity with heel-to-shin.  No dysarthria, no facial droop.  There is no neglect, LVO negative.  I reviewed external records. Patient daughter is an independent historian.  -Patient and his daughter in the room.  They were asked multiple times when the symptoms were started and they both say they started yesterday around 1400.  The dizziness has been persistent throughout today although the gait abnormality is intermittent.  He is unable to stand without assistance which is very atypical for the patient.  I reviewed note from family medicine doctor who was seen by earlier today, he had a positive Romberg in the office.  Will not stand patient now due to unsteady gait.  She is very concerned about basilar stroke.  Patient is outside of tPA window and is LVO negative.  Discussed with my attending who also was in room during the evaluation, not a code stroke.  Stroke work-up initiated and will consult neurology after CT head is obtained.   Laboratory work-up ordered and viewed and interpreted by myself.  CBC without leukocytosis.  Mild anemia hemoglobin 12.4.  COVID-negative, no gross electrolyte derangement, transaminitis or AKI.  PT/INR within normal limits, CBG unremarkable.  UDS negative.  Concern for right thalamic stroke.  Plavix, aspirin ordered as advised by neurology.  MRI brain with and without pending.  Consults as documented in ED course.  I also consulted family medicine residents who accepted patient for admission.  Discussed HPI, physical exam and plan of care for this patient with attending Pattricia Boss. The attending physician evaluated this patient as part of a shared visit and agrees with plan of care.         Final Clinical Impression(s) / ED Diagnoses Final diagnoses:  Right thalamic stroke [facility])    Rx / DC Orders ED Discharge Orders     None         Sherrill Raring, PA-C 07/19/22 1705    Pattricia Boss, MD 07/20/22 [phone]

## 2022-07-19 NOTE — Assessment & Plan Note (Addendum)
With acute onset dizziness, left foot dragging earlier, positive Romberg, and severely elevated blood pressure in clinic, I am highly concerned for a basilar stroke.  Discussed with patient and daughter going immediately to the emergency room.  Unclear if he is still in the 4-hour tPA window, as patient reports he was alone in [place] earlier when he started to walk off balance.  I personally called ahead to give report to the charge nurse Megan.  [facility] RN April wheeling patient over in wheelchair immediately given that hospital is across the street.  Precepted with attending Dr. Domenic Schwab.

## 2022-07-19 NOTE — Assessment & Plan Note (Addendum)
MRI showed small acute infarct right centrum semiovale. Risk stratifications so far notable for LDL 87, A1c 6.1%.  -S/p ASA 325 mg, Plavix 300 mg x1 -Start DAPT with ASA 81 mg, Plavix 75 mg today -Heart healthy diet (passed bedside swallow) -PT/OT recommendations -Continuous cardiac monitoring for arrhythmia  -U/S Carotid pending   -Start high-intensity statin  -Allow for permissive HTN -Neuro checks q4h  -Neuro following, appreciate care and recommendations

## 2022-07-19 NOTE — ED Notes (Signed)
ED TO INPATIENT HANDOFF REPORT  ED Nurse Name and Phone #: [phone]   S Name/Age/Gender Daniel [name] 76 y.o. male Room/Bed: 005C/005C  Code Status   Code Status: DNR  Home/SNF/Other Home Patient oriented to: self, place, time, and situation Is this baseline? Yes   Triage Complete: Triage complete  Chief Complaint CVA (cerebral vascular accident) [facility]) [I63.9]  Triage Note Pt got up yesterday, felt good; 1400 yesterday pt ran errand, while walking into store pt began feeling dizzy; went home; still feeling dizzy this am, states he was "stumbling like a drunk man"; pt states daughter noticed him dragging L foot; pt endorses weakness In L leg   Allergies No Known Allergies  Level of Care/Admitting Diagnosis ED Disposition     ED Disposition  [place]: [place] [[id number]]  Level of Care: Progressive [102]  Admit to Progressive based on following criteria: NEUROLOGICAL AND NEUROSURGICAL complex patients with significant risk of instability, who do not meet ICU criteria, yet require close observation or frequent assessment (< / = every 2 - 4 hours) with medical / nursing intervention.  May place patient in observation at [facility] or [place] if equivalent level of care is available:: No  Covid Evaluation: Asymptomatic - no recent exposure (last 10 days) testing not required  Diagnosis: CVA (cerebral vascular accident) [facility]) [[id number]]  Admitting Physician: Sharion Settler [[id number]]  Attending Physician: Leeanne Rio [id number]          B Medical/Surgery History Past Medical History:  Diagnosis Date   Hernia of fascia 2008   repair with many complications    Hypertension    Past Surgical History:  Procedure Laterality Date   APPENDECTOMY     HERNIA REPAIR       A IV Location/Drains/Wounds Patient Lines/Drains/Airways Status     Active Line/Drains/Airways     Name Placement date Placement  time Site Days   Peripheral IV 07/19/22 18 G Right Antecubital 07/19/22  1428  Antecubital  less than 1            Intake/Output Last 24 hours No intake or output data in the 24 hours ending 07/19/22 1649  Labs/Imaging Results for orders placed or performed during the hospital encounter of 07/19/22 (from the past 48 hour(s))  Urine rapid drug screen (hosp performed)     Status: None   Collection Time: 07/19/22  1:59 PM  Result Value Ref Range   Opiates NONE DETECTED NONE DETECTED   Cocaine NONE DETECTED NONE DETECTED   Benzodiazepines NONE DETECTED NONE DETECTED   Amphetamines NONE DETECTED NONE DETECTED   Tetrahydrocannabinol NONE DETECTED NONE DETECTED   Barbiturates NONE DETECTED NONE DETECTED    Comment: (NOTE) DRUG SCREEN FOR MEDICAL PURPOSES ONLY.  IF CONFIRMATION IS NEEDED FOR ANY PURPOSE, NOTIFY LAB WITHIN 5 DAYS.  LOWEST DETECTABLE LIMITS FOR URINE DRUG SCREEN Drug Class                     Cutoff (ng/mL) Amphetamine and metabolites    1000 Barbiturate and metabolites    200 Benzodiazepine                 831 Tricyclics and metabolites     300 Opiates and metabolites        300 Cocaine and metabolites        300 THC  50 Performed at [facility], [place] [address] [place], [place] [address]   Urinalysis, Routine w reflex microscopic     Status: Abnormal   Collection Time: 07/19/22  1:59 PM  Result Value Ref Range   Color, Urine STRAW (A) YELLOW   APPearance CLEAR CLEAR   Specific Gravity, Urine 1.008 1.005 - 1.030   pH 7.0 5.0 - 8.0   Glucose, UA NEGATIVE NEGATIVE mg/dL   Hgb urine dipstick NEGATIVE NEGATIVE   Bilirubin Urine NEGATIVE NEGATIVE   Ketones, ur NEGATIVE NEGATIVE mg/dL   Protein, ur NEGATIVE NEGATIVE mg/dL   Nitrite NEGATIVE NEGATIVE   Leukocytes,Ua NEGATIVE NEGATIVE    Comment: Performed at [facility] [address] [place], [place] [address]  CBG monitoring, ED     Status: None   Collection Time:  07/19/22  2:18 PM  Result Value Ref Range   Glucose-Capillary 81 70 - 99 mg/dL    Comment: Glucose reference range applies only to samples taken after fasting for at least 8 hours.  Ethanol     Status: None   Collection Time: 07/19/22  2:30 PM  Result Value Ref Range   Alcohol, Ethyl (B) <10 <10 mg/dL    Comment: (NOTE) Lowest detectable limit for serum alcohol is 10 mg/dL.  For medical purposes only. Performed at [facility], [place] [address] [place], [place] [address]   Protime-INR     Status: None   Collection Time: 07/19/22  2:30 PM  Result Value Ref Range   Prothrombin Time 12.9 11.4 - 15.2 seconds   INR 1.0 0.8 - 1.2    Comment: (NOTE) INR goal varies based on device and disease states. Performed at [facility], [place] [address], [place], [place] [address]   APTT     Status: None   Collection Time: 07/19/22  2:30 PM  Result Value Ref Range   aPTT 24 24 - 36 seconds    Comment: Performed at [facility] [address], [place],  [address]  CBC     Status: Abnormal   Collection Time: 07/19/22  2:30 PM  Result Value Ref Range   WBC 7.2 4.0 - 10.5 K/uL   RBC 4.36 4.22 - 5.81 MIL/uL   Hemoglobin 12.4 (L) 13.0 - 17.0 g/dL   HCT 38.6 (L) 39.0 - 52.0 %   MCV 88.5 80.0 - 100.0 fL   MCH 28.4 26.0 - 34.0 pg   MCHC 32.1 30.0 - 36.0 g/dL   RDW 13.1 11.5 - 15.5 %   Platelets 228 150 - 400 K/uL   nRBC 0.0 0.0 - 0.2 %    Comment: Performed at [facility], [place] [address] [place], [place] [address]  Differential     Status: None   Collection Time: 07/19/22  2:30 PM  Result Value Ref Range   Neutrophils Relative % 67 %   Neutro Abs 4.8 1.7 - 7.7 K/uL   Lymphocytes Relative 22 %   Lymphs Abs 1.6 0.7 - 4.0 K/uL   Monocytes Relative 5 %   Monocytes Absolute 0.4 0.1 - 1.0 K/uL   Eosinophils Relative 5 %   Eosinophils Absolute 0.4 0.0 - 0.5 K/uL   Basophils Relative 1 %   Basophils Absolute 0.0 0.0 - 0.1 K/uL   Immature Granulocytes 0 %   Abs  Immature Granulocytes 0.03 0.00 - 0.07 K/uL    Comment: Performed at [facility]  [address] [place], [place] [address]  Comprehensive metabolic panel     Status: Abnormal   Collection Time: 07/19/22  2:30 PM  Result Value Ref Range   Sodium 137 135 - 145 mmol/L   Potassium 4.1 3.5 - 5.1 mmol/L   Chloride 104 98 - 111 mmol/L   CO2 26 22 - 32 mmol/L   Glucose, Bld 103 (H) 70 - 99 mg/dL    Comment: Glucose reference range applies only to samples taken after fasting for at least 8 hours.   BUN 19 8 - 23 mg/dL   Creatinine, Ser 1.01 0.61 - 1.24 mg/dL   Calcium 9.0 8.9 - 10.3 mg/dL   Total Protein 6.4 (L) 6.5 - 8.1 g/dL   Albumin 3.6 3.5 - 5.0 g/dL   AST 15 15 - 41 U/L   ALT 16 0 - 44 U/L   Alkaline Phosphatase 77 38 - 126 U/L   Total Bilirubin 0.8 0.3 - 1.2 mg/dL   GFR, Estimated >60 >60 mL/min    Comment: (NOTE) Calculated using the CKD-EPI Creatinine Equation (2021)    Anion gap 7 5 - 15    Comment: Performed at [facility] [address] [place],  [address]  I-stat chem 8, ED     Status: Abnormal   Collection Time: 07/19/22  2:41 PM  Result Value Ref Range   Sodium 137 135 - 145 mmol/L   Potassium 4.0 3.5 - 5.1 mmol/L   Chloride 102 98 - 111 mmol/L   BUN 19 8 - 23 mg/dL   Creatinine, Ser 0.90 0.61 - 1.24 mg/dL   Glucose, Bld 98 70 - 99 mg/dL    Comment: Glucose reference range applies only to samples taken after fasting for at least 8 hours.   Calcium, Ion 1.17 1.15 - 1.40 mmol/L   TCO2 26 22 - 32 mmol/L   Hemoglobin 12.6 (L) 13.0 - 17.0 g/dL   HCT 37.0 (L) 39.0 - 52.0 %   CT HEAD WO CONTRAST  Result Date: 07/19/2022 CLINICAL DATA:  Dizziness and left leg weakness EXAM: CT HEAD WITHOUT CONTRAST TECHNIQUE: Contiguous axial images were obtained from the base of the skull through the vertex without intravenous contrast. RADIATION DOSE REDUCTION: This exam was performed according to the departmental dose-optimization program which includes automated  exposure control, adjustment of the mA and/or kV according to patient size and/or use of iterative reconstruction technique. COMPARISON:  None Available. FINDINGS: Brain: There is hypodensity in the right thalamic capsular region consistent with age-indeterminate but possibly acute or subacute infarct. There is no evidence of large vessel territorial infarct. There is no acute intracranial hemorrhage or extra-axial fluid collection. There are remote appearing infarcts in the left cerebral hemisphere white matter and mild background chronic small vessel ischemic change. Parenchymal volume is normal for age. The ventricles are normal in size. There is no mass lesion.  There is no mass effect or midline shift. Vascular: There is calcification of the bilateral carotid siphons and vertebral arteries. Skull: Normal. Negative for fracture or focal lesion. Sinuses/Orbits: There is mild mucosal thickening in the paranasal sinuses. Bilateral lens implants are in place. The globes and orbits are otherwise unremarkable. Other: None. IMPRESSION: 1. Small focus of hypodensity in the right thalamic capsular region is age indeterminate but could reflect a small acute or subacute infarct. Consider brain MRI for further evaluation. 2. No evidence of large vessel territorial infarct or hemorrhage. Electronically Signed   By: Valetta Mole M.D.   On:  07/19/2022 14:56    Pending Labs Unresulted Labs (From admission, onward)     Start     Ordered   07/19/22 1630  Lipid panel  (Labs)  Tomorrow morning,   R       Comments: Fasting    07/19/22 1631   07/19/22 1629  Hemoglobin A1c  (Labs)  Once,   R       Comments: To assess prior glycemic control    07/19/22 1631   07/19/22 1422  Resp Panel by RT-PCR (Flu A&B, Covid) Anterior Nasal Swab  (Asymptomatic - Covid)  Once,   URGENT        07/19/22 1421            Vitals/Pain Today's Vitals   07/19/22 1559 07/19/22 1600 07/19/22 1601 07/19/22 1630  BP: (!) 165/81 (!)  165/81    Pulse:  (!) 39  (!) 59  Resp:  18  16  Temp:      TempSrc:      SpO2:    99%  PainSc:   0-No pain     Isolation Precautions No active isolations  Medications Medications  aspirin tablet 325 mg (325 mg Oral Given 07/19/22 1600)   stroke: early stages of recovery book (has no administration in time range)  acetaminophen (TYLENOL) tablet 650 mg (has no administration in time range)    Or  acetaminophen (TYLENOL) suppository 650 mg (has no administration in time range)  senna-docusate (Senokot-S) tablet 1 tablet (has no administration in time range)  enoxaparin (LOVENOX) injection 40 mg (has no administration in time range)  aspirin EC tablet 81 mg (has no administration in time range)  clopidogrel (PLAVIX) tablet 75 mg (has no administration in time range)  clopidogrel (PLAVIX) tablet 300 mg (300 mg Oral Given 07/19/22 1600)    Mobility walks     Focused Assessments    R Recommendations: See Admitting Provider Note  Report given to:   Additional Notes:   [phone]

## 2022-07-20 ENCOUNTER — Observation Stay (HOSPITAL_COMMUNITY): Payer: Medicare HMO

## 2022-07-20 DIAGNOSIS — I6381 Other cerebral infarction due to occlusion or stenosis of small artery: Secondary | ICD-10-CM | POA: Diagnosis not present

## 2022-07-20 DIAGNOSIS — I639 Cerebral infarction, unspecified: Secondary | ICD-10-CM | POA: Diagnosis not present

## 2022-07-20 DIAGNOSIS — R0989 Other specified symptoms and signs involving the circulatory and respiratory systems: Secondary | ICD-10-CM | POA: Diagnosis not present

## 2022-07-20 DIAGNOSIS — Z83438 Family history of other disorder of lipoprotein metabolism and other lipidemia: Secondary | ICD-10-CM | POA: Diagnosis not present

## 2022-07-20 DIAGNOSIS — R4781 Slurred speech: Secondary | ICD-10-CM | POA: Diagnosis not present

## 2022-07-20 DIAGNOSIS — Z20822 Contact with and (suspected) exposure to covid-19: Secondary | ICD-10-CM | POA: Diagnosis not present

## 2022-07-20 DIAGNOSIS — Z8249 Family history of ischemic heart disease and other diseases of the circulatory system: Secondary | ICD-10-CM | POA: Diagnosis not present

## 2022-07-20 DIAGNOSIS — K5901 Slow transit constipation: Secondary | ICD-10-CM | POA: Diagnosis not present

## 2022-07-20 DIAGNOSIS — I10 Essential (primary) hypertension: Secondary | ICD-10-CM | POA: Diagnosis not present

## 2022-07-20 DIAGNOSIS — R29701 NIHSS score 1: Secondary | ICD-10-CM | POA: Diagnosis not present

## 2022-07-20 DIAGNOSIS — Z79899 Other long term (current) drug therapy: Secondary | ICD-10-CM | POA: Diagnosis not present

## 2022-07-20 DIAGNOSIS — E7800 Pure hypercholesterolemia, unspecified: Secondary | ICD-10-CM | POA: Diagnosis not present

## 2022-07-20 DIAGNOSIS — Z8349 Family history of other endocrine, nutritional and metabolic diseases: Secondary | ICD-10-CM | POA: Diagnosis not present

## 2022-07-20 DIAGNOSIS — Z8262 Family history of osteoporosis: Secondary | ICD-10-CM | POA: Diagnosis not present

## 2022-07-20 DIAGNOSIS — E785 Hyperlipidemia, unspecified: Secondary | ICD-10-CM | POA: Diagnosis not present

## 2022-07-20 DIAGNOSIS — Z818 Family history of other mental and behavioral disorders: Secondary | ICD-10-CM | POA: Diagnosis not present

## 2022-07-20 DIAGNOSIS — Z66 Do not resuscitate: Secondary | ICD-10-CM | POA: Diagnosis not present

## 2022-07-20 DIAGNOSIS — Z811 Family history of alcohol abuse and dependence: Secondary | ICD-10-CM | POA: Diagnosis not present

## 2022-07-20 DIAGNOSIS — E860 Dehydration: Secondary | ICD-10-CM | POA: Diagnosis not present

## 2022-07-20 DIAGNOSIS — G8194 Hemiplegia, unspecified affecting left nondominant side: Secondary | ICD-10-CM | POA: Diagnosis not present

## 2022-07-20 DIAGNOSIS — K5900 Constipation, unspecified: Secondary | ICD-10-CM | POA: Diagnosis not present

## 2022-07-20 DIAGNOSIS — I63311 Cerebral infarction due to thrombosis of right middle cerebral artery: Secondary | ICD-10-CM | POA: Diagnosis not present

## 2022-07-20 DIAGNOSIS — G319 Degenerative disease of nervous system, unspecified: Secondary | ICD-10-CM | POA: Diagnosis not present

## 2022-07-20 DIAGNOSIS — R7303 Prediabetes: Secondary | ICD-10-CM | POA: Diagnosis not present

## 2022-07-20 DIAGNOSIS — R252 Cramp and spasm: Secondary | ICD-10-CM | POA: Diagnosis not present

## 2022-07-20 DIAGNOSIS — Z87891 Personal history of nicotine dependence: Secondary | ICD-10-CM | POA: Diagnosis not present

## 2022-07-20 DIAGNOSIS — R29810 Facial weakness: Secondary | ICD-10-CM | POA: Diagnosis not present

## 2022-07-20 NOTE — Care Management Important Message (Signed)
Important Message  Patient Details  Name: Daniel [name] MRN: [id number] Date of Birth: 1946-02-24   Medicare Important Message Given:  Yes     Rowin Bayron 07/20/2022, 3:10 PM

## 2022-07-20 NOTE — Progress Notes (Addendum)
STROKE TEAM PROGRESS NOTE   INTERVAL HISTORY Patient is seen in his room with his daughter at the bedside.  Yesterday, he began to experience left leg weakness and dizziness.  He stated that he felt as though he was drunk.  He saw his PCP and was directed to come to the ED for stroke workup.   MRI scan of the brain shows small right corona radiator lacunar infarct.  MR angiogram of the brain shows no large vessel stenosis or occlusion.  Echocardiogram shows ejection fraction 55 to 60%.  Urine drug screen is negative.  Hemoglobin A1c 6.1.  LDL cholesterol 87 mg percent. Vitals:   07/20/22 0027 07/20/22 0425 07/20/22 0805 07/20/22 1203  BP: 131/76 (!) 145/85 (!) 159/99 136/60  Pulse: 63 62 (!) 57 61  Resp: '17 17 16 16  '$ Temp: 98.4 F (36.9 C) 98.3 F (36.8 C) 98.1 F (36.7 C) 98 F (36.7 C)  TempSrc: Oral Oral Oral Oral  SpO2: 96% 96% 96% 95%   CBC:  Recent Labs  Lab 07/19/22 1430 07/19/22 1441  WBC 7.2  --   NEUTROABS 4.8  --   HGB 12.4* 12.6*  HCT 38.6* 37.0*  MCV 88.5  --   PLT 228  --    Basic Metabolic Panel:  Recent Labs  Lab 07/19/22 1430 07/19/22 1441  NA 137 137  K 4.1 4.0  CL 104 102  CO2 26  --   GLUCOSE 103* 98  BUN 19 19  CREATININE 1.01 0.90  CALCIUM 9.0  --    Lipid Panel:  Recent Labs  Lab 07/19/22 1430  CHOL 166  TRIG 70  HDL 65  CHOLHDL 2.6  VLDL 14  LDLCALC 87   HgbA1c:  Recent Labs  Lab 07/19/22 1430  HGBA1C 6.1*   Urine Drug Screen:  Recent Labs  Lab 07/19/22 1359  LABOPIA NONE DETECTED  COCAINSCRNUR NONE DETECTED  LABBENZ NONE DETECTED  AMPHETMU NONE DETECTED  THCU NONE DETECTED  LABBARB NONE DETECTED    Alcohol Level  Recent Labs  Lab 07/19/22 1430  ETH <10    IMAGING past 24 hours VAS US CAROTID  Result Date: 07/20/2022 Carotid Arterial Duplex Study Patient Name:  Daniel [name]  Date of Exam:   07/20/2022 Medical Rec #: [id number]        Accession #:    [id number] Date of Birth: [birth year]         Patient Gender: M  Patient Age:   76 years Exam Location:  [facility] Procedure:      VAS US CAROTID Referring Phys: Janine Ores --------------------------------------------------------------------------------  Indications:  CVA and Other cardiovascular symptoms. Risk Factors: Hypertension, past history of smoking, prior CVA. Performing Technologist: Bobetta Lime BS, RVT  Examination Guidelines: A complete evaluation includes B-mode imaging, spectral Doppler, color Doppler, and power Doppler as needed of all accessible portions of each vessel. Bilateral testing is considered an integral part of a complete examination. Limited examinations for reoccurring indications may be performed as noted.  Right Carotid Findings: +----------+--------+--------+--------+-------------------------+--------+           PSV cm/sEDV cm/sStenosisPlaque Description       Comments +----------+--------+--------+--------+-------------------------+--------+ CCA Prox  56      17                                                +----------+--------+--------+--------+-------------------------+--------+  CCA Distal58      14                                                +----------+--------+--------+--------+-------------------------+--------+ ICA Prox  65      22              heterogenous and calcific         +----------+--------+--------+--------+-------------------------+--------+ ICA Distal51      15                                                +----------+--------+--------+--------+-------------------------+--------+ ECA       69      11              heterogenous and calcific         +----------+--------+--------+--------+-------------------------+--------+ +----------+--------+-------+----------------+-------------------+           PSV cm/sEDV cmsDescribe        Arm Pressure (mmHG) +----------+--------+-------+----------------+-------------------+ [id number]            Multiphasic, WNL                     +----------+--------+-------+----------------+-------------------+ +---------+--------+--+--------+-+---------+ VertebralPSV cm/s45EDV cm/s7Antegrade +---------+--------+--+--------+-+---------+  Left Carotid Findings: +----------+--------+--------+--------+-------------------------+--------+           PSV cm/sEDV cm/sStenosisPlaque Description       Comments +----------+--------+--------+--------+-------------------------+--------+ CCA Prox  78      11                                                +----------+--------+--------+--------+-------------------------+--------+ CCA Distal58      17                                                +----------+--------+--------+--------+-------------------------+--------+ ICA Prox  50      18              heterogenous and calcific         +----------+--------+--------+--------+-------------------------+--------+ ICA Distal64      27                                                +----------+--------+--------+--------+-------------------------+--------+ ECA       76      14              heterogenous                      +----------+--------+--------+--------+-------------------------+--------+ +----------+--------+--------+----------------+-------------------+           PSV cm/sEDV cm/sDescribe        Arm Pressure (mmHG) +----------+--------+--------+----------------+-------------------+ [id number]             Multiphasic, WNL                    +----------+--------+--------+----------------+-------------------+ +---------+--------+--+--------+--+---------+ VertebralPSV cm/s45EDV cm/s11Antegrade +---------+--------+--+--------+--+---------+  Summary: Right Carotid: Velocities in the right ICA are consistent with a 1-39% stenosis. Left Carotid: Velocities in the left ICA are consistent with a 1-39% stenosis. Vertebrals:  Bilateral vertebral arteries demonstrate antegrade flow. Subclavians:  Normal flow hemodynamics were seen in bilateral subclavian              arteries. *See table(s) above for measurements and observations.     Preliminary    ECHOCARDIOGRAM COMPLETE  Result Date: 07/19/2022    ECHOCARDIOGRAM REPORT   Patient Name:   Daniel [name] Date of Exam: 07/19/2022 Medical Rec #:  [id number]       Height:       68.0 in Accession #:    [id number]      Weight:       188.4 lb Date of Birth:  05-09-1946        BSA:          1.992 m Patient Age:    62 years        BP:           165/81 mmHg Patient Gender: M               HR:           46 bpm. Exam Location:  Inpatient Procedure: 2D Echo Indications:    stroke  History:        Patient has no prior history of Echocardiogram examinations.                 Risk Factors:Hypertension.  Sonographer:    Johny Chess RDCS Referring Phys: Lindy  1. Left ventricular ejection fraction, by estimation, is 55 to 60%. Left ventricular ejection fraction by PLAX is 58 %. The left ventricle has normal function. The left ventricle has no regional wall motion abnormalities. Left ventricular diastolic parameters are consistent with Grade I diastolic dysfunction (impaired relaxation). Elevated left ventricular end-diastolic pressure.  2. Right ventricular systolic function is normal. The right ventricular size is normal. There is normal pulmonary artery systolic pressure.  3. Left atrial size was mildly dilated.  4. The mitral valve is normal in structure. Trivial mitral valve regurgitation. No evidence of mitral stenosis.  5. The aortic valve is calcified. There is mild calcification of the aortic valve. There is mild thickening of the aortic valve. Aortic valve regurgitation is trivial. No aortic stenosis is present.  6. Aortic dilatation noted. There is mild dilatation of the ascending aorta, measuring 37 mm.  7. The inferior vena cava is normal in size with greater than 50% respiratory variability, suggesting right atrial pressure of 3  mmHg. FINDINGS  Left Ventricle: Left ventricular ejection fraction, by estimation, is 55 to 60%. Left ventricular ejection fraction by PLAX is 58 %. The left ventricle has normal function. The left ventricle has no regional wall motion abnormalities. The left ventricular internal cavity size was normal in size. There is no left ventricular hypertrophy. Left ventricular diastolic parameters are consistent with Grade I diastolic dysfunction (impaired relaxation). Elevated left ventricular end-diastolic pressure. Right Ventricle: The right ventricular size is normal. No increase in right ventricular wall thickness. Right ventricular systolic function is normal. There is normal pulmonary artery systolic pressure. The tricuspid regurgitant velocity is 1.88 m/s, and  with an assumed right atrial pressure of 3 mmHg, the estimated right ventricular systolic pressure is 84.1 mmHg. Left Atrium: Left atrial size was mildly dilated. Right Atrium: Right atrial size was normal in size. Pericardium: There  is no evidence of pericardial effusion. Mitral Valve: The mitral valve is normal in structure. There is mild thickening of the mitral valve leaflet(s). There is mild calcification of the mitral valve leaflet(s). Mild mitral annular calcification. Trivial mitral valve regurgitation. No evidence  of mitral valve stenosis. Tricuspid Valve: The tricuspid valve is normal in structure. Tricuspid valve regurgitation is trivial. No evidence of tricuspid stenosis. Aortic Valve: The aortic valve is calcified. There is mild calcification of the aortic valve. There is mild thickening of the aortic valve. Aortic valve regurgitation is trivial. No aortic stenosis is present. Aortic valve mean gradient measures 5.0 mmHg. Aortic valve peak gradient measures 9.7 mmHg. Aortic valve area, by VTI measures 1.66 cm. Pulmonic Valve: The pulmonic valve was normal in structure. Pulmonic valve regurgitation is not visualized. No evidence of pulmonic  stenosis. Aorta: Aortic dilatation noted. There is mild dilatation of the ascending aorta, measuring 37 mm. Venous: The inferior vena cava is normal in size with greater than 50% respiratory variability, suggesting right atrial pressure of 3 mmHg. IAS/Shunts: No atrial level shunt detected by color flow Doppler.  LEFT VENTRICLE PLAX 2D LV EF:         Left            Diastology                ventricular     LV e' medial:    5.00 cm/s                ejection        LV E/e' medial:  17.6                fraction by     LV e' lateral:   6.31 cm/s                PLAX is 58      LV E/e' lateral: 14.0                %. LVIDd:         4.30 cm LVIDs:         3.00 cm LV PW:         1.10 cm LV IVS:        0.80 cm LVOT diam:     1.70 cm LV SV:         63 LV SV Index:   32 LVOT Area:     2.27 cm  LV Volumes (MOD) LV vol d, MOD    90.4 ml A4C: LV vol s, MOD    47.3 ml A4C: LV SV MOD A4C:   90.4 ml RIGHT VENTRICLE             IVC RV S prime:     11.10 cm/s  IVC diam: 1.70 cm TAPSE (M-mode): 3.2 cm LEFT ATRIUM             Index        RIGHT ATRIUM           Index LA diam:        3.80 cm 1.91 cm/m   RA Area:     10.20 cm LA Vol (A2C):   39.0 ml 19.57 ml/m  RA Volume:   19.20 ml  9.64 ml/m LA Vol (A4C):   62.8 ml 31.52 ml/m LA Biplane Vol: 52.4 ml 26.30 ml/m  AORTIC VALVE AV Area (Vmax):    1.82 cm AV Area (Vmean):   1.67 cm AV  Area (VTI):     1.66 cm AV Vmax:           156.00 cm/s AV Vmean:          105.000 cm/s AV VTI:            0.381 m AV Peak Grad:      9.7 mmHg AV Mean Grad:      5.0 mmHg LVOT Vmax:         125.00 cm/s LVOT Vmean:        77.400 cm/s LVOT VTI:          0.279 m LVOT/AV VTI ratio: 0.73  AORTA Ao Root diam: 3.50 cm Ao Asc diam:  3.70 cm MITRAL VALVE                TRICUSPID VALVE MV Area (PHT): 3.12 cm     TR Peak grad:   14.1 mmHg MV Decel Time: 243 msec     TR Vmax:        188.00 cm/s MV E velocity: 88.10 cm/s MV A velocity: 108.00 cm/s  SHUNTS MV E/A ratio:  0.82         Systemic VTI:  0.28 m                              Systemic Diam: 1.70 cm Skeet Latch MD Electronically signed by Skeet Latch MD Signature Date/Time: 07/19/2022/7:40:21 PM    Final    MR Brain W and Wo Contrast  Result Date: 07/19/2022 CLINICAL DATA:  Acute neuro deficit. Dizziness and left leg weakness. EXAM: MRI HEAD WITHOUT AND WITH CONTRAST MRA HEAD WITHOUT CONTRAST TECHNIQUE: Multiplanar, multi-echo pulse sequences of the brain and surrounding structures were acquired without and with intravenous contrast. Angiographic images of the Circle of Willis were acquired using MRA technique without intravenous contrast. CONTRAST:  8.6m GADAVIST GADOBUTROL 1 MMOL/ML IV SOLN COMPARISON:  CT head 07/19/2022 FINDINGS: MRI HEAD FINDINGS Brain: Image quality degraded by moderate motion and artifact. Small acute infarct in the right centrum semiovale. No other acute infarct Ventricle size normal. Chronic infarct right thalamus. Chronic microvascular ischemic changes in the white matter. Chronic microhemorrhage right posterior temporal lobe. No mass lesion Normal enhancement postcontrast administration. Image quality degraded by motion. Vascular: Normal arterial flow voids. Skull and upper cervical spine: No focal skeletal lesion. Sinuses/Orbits: Mucosal edema paranasal sinuses. Bilateral cataract extraction Other: None MRA HEAD FINDINGS Anterior circulation: Internal carotid artery patent bilaterally. Anterior and middle cerebral arteries patent. There is decreased signal in the right MCA at the bifurcation likely due to tortuosity. Distal right MCA branches appear patent Posterior circulation: Both vertebral arteries patent to the basilar. Left vertebral artery dominant. Basilar patent. Decreased signal in the posterior cerebral arteries bilaterally likely due to tortuosity. Anatomic variants: None Image quality degraded by moderate motion IMPRESSION: 1. Small acute infarct right centrum semiovale 2. Mild to moderate chronic microvascular  ischemic change involving the white matter and right thalamus. Motion degraded study of the brain. 3. MRA is limited by vessel tortuosity and motion. No large vessel occlusion identified Electronically Signed   By: CFranchot GalloM.D.   On: 07/19/2022 19:26   MR ANGIO HEAD WO CONTRAST  Result Date: 07/19/2022 CLINICAL DATA:  Acute neuro deficit. Dizziness and left leg weakness. EXAM: MRI HEAD WITHOUT AND WITH CONTRAST MRA HEAD WITHOUT CONTRAST TECHNIQUE: Multiplanar, multi-echo pulse sequences of the brain and surrounding structures were acquired without  and with intravenous contrast. Angiographic images of the Circle of Willis were acquired using MRA technique without intravenous contrast. CONTRAST:  8.24m GADAVIST GADOBUTROL 1 MMOL/ML IV SOLN COMPARISON:  CT head 07/19/2022 FINDINGS: MRI HEAD FINDINGS Brain: Image quality degraded by moderate motion and artifact. Small acute infarct in the right centrum semiovale. No other acute infarct Ventricle size normal. Chronic infarct right thalamus. Chronic microvascular ischemic changes in the white matter. Chronic microhemorrhage right posterior temporal lobe. No mass lesion Normal enhancement postcontrast administration. Image quality degraded by motion. Vascular: Normal arterial flow voids. Skull and upper cervical spine: No focal skeletal lesion. Sinuses/Orbits: Mucosal edema paranasal sinuses. Bilateral cataract extraction Other: None MRA HEAD FINDINGS Anterior circulation: Internal carotid artery patent bilaterally. Anterior and middle cerebral arteries patent. There is decreased signal in the right MCA at the bifurcation likely due to tortuosity. Distal right MCA branches appear patent Posterior circulation: Both vertebral arteries patent to the basilar. Left vertebral artery dominant. Basilar patent. Decreased signal in the posterior cerebral arteries bilaterally likely due to tortuosity. Anatomic variants: None Image quality degraded by moderate motion  IMPRESSION: 1. Small acute infarct right centrum semiovale 2. 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 Motion degraded study of the brain. 3. MRA is limited by vessel tortuosity and motion. No large vessel occlusion identified Electronically Signed   By: CFranchot GalloM.D.   On: 07/19/2022 19:26   CT HEAD WO CONTRAST  Result Date: 07/19/2022 CLINICAL DATA:  Dizziness and left leg weakness EXAM: CT HEAD WITHOUT CONTRAST TECHNIQUE: Contiguous axial images were obtained from the base of the skull through the vertex without intravenous contrast. RADIATION DOSE REDUCTION: This exam was performed according to the departmental dose-optimization program which includes automated exposure control, adjustment of the mA and/or kV according to patient size and/or use of iterative reconstruction technique. COMPARISON:  None Available. FINDINGS: Brain: There is hypodensity in the right thalamic capsular region consistent with age-indeterminate but possibly acute or subacute infarct. There is no evidence of large vessel territorial infarct. There is no acute intracranial hemorrhage or extra-axial fluid collection. There are remote appearing infarcts in the left cerebral hemisphere white matter and mild background chronic small vessel ischemic change. Parenchymal volume is normal for age. The ventricles are normal in size. There is no mass lesion.  There is no mass effect or midline shift. Vascular: There is calcification of the bilateral carotid siphons and vertebral arteries. Skull: Normal. Negative for fracture or focal lesion. Sinuses/Orbits: There is mild mucosal thickening in the paranasal sinuses. Bilateral lens implants are in place. The globes and orbits are otherwise unremarkable. Other: None. IMPRESSION: 1. Small focus of hypodensity in the right thalamic capsular region is age indeterminate but could reflect a small acute or subacute infarct. Consider  brain MRI for further evaluation. 2. No evidence of large vessel territorial infarct or hemorrhage. Electronically Signed   By: PValetta MoleM.D.   On: 07/19/2022 14:56    PHYSICAL EXAM General:  Alert, well-nourished, well-developed elderly Caucasian male patient in no acute distress Respiratory:  Regular, unlabored respirations on room air  NEURO:  Mental Status: AA&Ox3  Speech/Language: speech is without dysarthria or aphasia.  Fluency, and comprehension intact.  Cranial Nerves:  II: PERRL. Visual fields full.  III, IV, VI: EOMI. Eyelids elevate symmetrically.  V: Sensation is intact to light touch and symmetrical to face.  VII: Smile is symmetrical.   VIII: hearing intact to voice. IX, X: Phonation is  normal.  XII: tongue is midline without fasciculations. Motor: 5/5 strength to all muscle groups tested.  Tone: is normal and bulk is normal Sensation- Intact to light touch bilaterally.  Coordination: FTN intact bilaterally, HKS: no ataxia in BLE.  Drift in left leg, diminished fine motor movements on left.  Gait- deferred   ASSESSMENT/PLAN Daniel [name] is a 76 y.o. male with history of HTN presenting with acute onset left leg weakness and dizziness.  He stated that he felt as though he was drunk.  He saw his PCP and was directed to come to the ED for stroke workup. MRI shows right thalamic infarct  Stroke:  right thalamic infarct Etiology:  likely small vessel disease  CT head Small hypodensity in right thalamocapsular region MRI  small acute infarct in right centrum semiovale MRA  no LVO Carotid Doppler  1-39% stenosis in bilateral carotids 2D Echo EF 16-10%, grade 1 diastolic dysfunction, mildly dilated left atrium, no atrial level shunt LDL 87 HgbA1c 6.1 VTE prophylaxis - lovenox    Diet   Diet Heart Room service appropriate? Yes; Fluid consistency: Thin   No antithrombotic prior to admission, now on aspirin 81 mg daily and clopidogrel 75 mg daily.  For 3 weeks  followed by aspirin alone Therapy recommendations:  CIR Disposition:  pending  Hypertension Home meds:  losartan 50 mg daily Stable Permissive hypertension (OK if < 220/120) but gradually normalize in 5-7 days Long-term BP goal normotensive  Hyperlipidemia Home meds:  none LDL 87, goal < 70 Add rosuvastatin 20 mg daily  Continue statin at discharge   Other Stroke Risk Factors Advanced Age >/= 24  Former cigarette smoker  Other Active Problems none  Hospital day # [place] , MSN, AGACNP-BC [facility] See Amion for schedule and pager information 07/20/2022 2:39 PM   STROKE MD NOTE :  I have personally obtained history,examined this patient, reviewed notes, independently viewed imaging studies, participated in medical decision making and plan of care.ROS completed by me personally and pertinent positives fully documented  I have made any additions or clarifications directly to the above note. Agree with note above.  Patient presented with dizziness and left leg weakness and MRI scan confirms right subcortical small lacunar infarct likely from small vessel disease.  Recommend aspirin and Plavix for 3 weeks followed by aspirin alone and aggressive risk factor modification.  Start statin.  Mobilize out of bed.  Physical Occupational Therapy consults.  Discussed with patient and daughter and answered questions.  Greater than 50% time during this 50-minute visit was spent in counseling and coordination of care about his lacunar stroke and discussion about management and prevention and answering questions and discussion with care team.  Antony Contras, MD Medical Director [facility] Pager: [phone] 07/20/2022 2:56 PM   To contact Stroke Continuity provider, please refer to [email or web address] After hours, contact General Neurology

## 2022-07-20 NOTE — Evaluation (Signed)
Physical Therapy Evaluation Patient Details Name: Daniel [name] MRN: [id number] DOB: 20-Dec-1945 Today's Date: 07/20/2022  History of Present Illness  Daniel [name] is a 76 y.o. 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  PMH positive for HTN and hernia repair.  Clinical Impression  Patient presents with decreased balance, decreased coordination, decreased spatial perceptual awareness and high risk for falls.  He was able to ambulate without device with heavy mod A and increasing R lateral lean and L foot dragging.  He improved with using RW, but still need min to mod A and mod cues for safety.  He lives at home with his daughters and has assist as needed.  Feel he will benefit from follow up acute inpatient rehab prior to d/c home with family support as he already has fall history.  PT to continue to follow acutely.        Recommendations for follow up therapy are one component of a multi-disciplinary discharge planning process, led by the attending physician.  Recommendations may be updated based on patient status, additional functional criteria and insurance authorization.  Follow Up Recommendations Acute inpatient rehab (3hours/day)      Assistance Recommended at Discharge Frequent or constant Supervision/Assistance  Patient can return home with the following  A lot of help with walking and/or transfers;A lot of help with bathing/dressing/bathroom;Direct supervision/assist for medications management;Help with stairs or ramp for entrance;Assist for transportation;Assistance with cooking/housework    Equipment Recommendations Rolling walker (2 wheels)  Recommendations for Other Services  Rehab consult    Functional Status Assessment Patient has had a recent decline in their functional status and demonstrates the ability to make significant improvements in function in a reasonable and predictable amount of time.      Precautions / Restrictions Precautions Precautions: Fall      Mobility  Bed Mobility Overal bed mobility: Needs Assistance Bed Mobility: Supine to Sit, Sit to Supine     Supine to sit: Min assist, HOB elevated Sit to supine: Min assist   General bed mobility comments: increased time, assist for balance as posterior and R lateral lean; to supine assist for guiding legs onto bed    Transfers Overall transfer level: Needs assistance Equipment used: None, Rolling walker (2 wheels) Transfers: Sit to/from Stand Sit to Stand: Min assist           General transfer comment: assist for balance, initially no device, then with RW    Ambulation/Gait Ambulation/Gait assistance: Mod assist Gait Distance (Feet): 75 Feet (x 2) Assistive device: None, Rolling walker (2 wheels) Gait Pattern/deviations: Step-to pattern, Step-through pattern, Decreased stride length, Decreased dorsiflexion - left, Drifts right/left, Wide base of support, Ataxic       General Gait Details: Initially no device, but pt with wide turns and mod A for balance throughout with leaning R and L foot dragging, feet wide with ataxic quality; with RW better, but still with assist for balance, cues to slow down and stay inside walker.  Stairs Stairs: Yes Stairs assistance: Mod assist Stair Management: Two rails, Step to pattern, Forwards Number of Stairs: 2 General stair comments: using rails and difficulty with stepping ataxic quality and high risk for falls unaided  Wheelchair Mobility    Modified Rankin (Stroke Patients Only) Modified Rankin (Stroke Patients Only) Pre-Morbid Rankin Score: No significant disability Modified Rankin: Moderately severe disability     Balance Overall balance assessment: Needs assistance, History of Falls Sitting-balance  support: Feet supported Sitting balance-Leahy Scale: Poor Sitting balance - Comments: leaning back and to R initially min A for balance, able to improve with  time and donned socks on EOB with intermittent min A and cues for balance Postural control: Right lateral lean, Posterior lean Standing balance support: No upper extremity supported Standing balance-Leahy Scale: Poor Standing balance comment: legs braced on bed initially standing without UE support and min A                             Pertinent Vitals/Pain Pain Assessment Pain Assessment: No/denies pain    Home Living Family/patient expects to be discharged to:: Private residence Living Arrangements: Children Available Help at Discharge: Family Type of Home: House Home Access: Stairs to enter Entrance Stairs-Rails: Psychiatric nurse of Steps: 3   Home Layout: One level Home Equipment: Radio producer - single point Additional Comments: lives with daughter who works only one day a week and 76 y/o adopted daughter    Prior Function Prior Level of Function : Independent/Modified Independent;Driving             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, drives, 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, no injury and self recovery       Hand Dominance   Dominant Hand: Right    Extremity/Trunk Assessment   Upper Extremity Assessment Upper Extremity Assessment: Defer to OT evaluation    Lower Extremity Assessment Lower Extremity Assessment: LLE deficits/detail LLE Deficits / Details: decreased coordination with toe taps    Cervical / Trunk Assessment Cervical / Trunk Assessment: Other exceptions Cervical / Trunk Exceptions: generalized trunk stiffness  Communication   Communication: HOH  Cognition Arousal/Alertness: Awake/alert Behavior During Therapy: WFL for tasks assessed/performed Overall Cognitive Status: Impaired/Different from baseline Area of Impairment: Attention, Following commands, Safety/judgement                   Current Attention Level: Selective   Following Commands: Follows one step commands with  increased time, Follows one step commands consistently Safety/Judgement: Decreased awareness of deficits     General Comments: hard of hearing        General Comments General comments (skin integrity, edema, etc.): daughter arrived at end of session and vascular tech in room for carotid duplex so returned to supine    Exercises     Assessment/Plan    PT Assessment Patient needs continued PT services  PT Problem List Decreased strength;Decreased mobility;Decreased safety awareness;Decreased activity tolerance;Decreased balance;Decreased knowledge of use of DME;Decreased coordination       PT Treatment Interventions DME instruction;Therapeutic exercise;Gait training;Balance training;Stair training;Functional mobility training;Therapeutic activities;Patient/family education;Cognitive remediation    PT Goals (Current goals can be found in the Care Plan section)  Acute Rehab PT Goals Patient Stated Goal: to go home PT Goal Formulation: With patient/family Time For Goal Achievement: 08/03/22 Potential to Achieve Goals: Good    Frequency Min 4X/week     Co-evaluation               AM-PAC PT "6 Clicks" Mobility  Outcome Measure Help needed turning from your back to your side while in a flat bed without using bedrails?: A Little Help needed moving from lying on your back to sitting on the side of a flat bed without using bedrails?: A Little Help needed moving to and from a bed to a chair (including a wheelchair)?: A Lot Help needed standing up  from a chair using your arms (e.g., wheelchair or bedside chair)?: A Lot Help needed to walk in hospital room?: A Lot Help needed climbing 3-5 steps with a railing? : A Lot 6 Click Score: 14    End of Session Equipment Utilized During Treatment: Gait belt Activity Tolerance: Patient tolerated treatment well Patient left: in bed;with bed alarm set;with family/visitor present;Other (comment) (vascular tech in room)   PT Visit  Diagnosis: Other abnormalities of gait and mobility (R26.89);History of falling (Z91.81);Ataxic gait (R26.0)    Time: 1660-6301 PT Time Calculation (min) (ACUTE ONLY): 32 min   Charges:   PT Evaluation $PT Eval Moderate Complexity: 1 Mod PT Treatments $Gait Training: 8-22 mins        Magda Kiel, PT Acute Rehabilitation Services Office:[phone] 07/20/2022   Reginia Naas 07/20/2022, 10:05 AM

## 2022-07-20 NOTE — Evaluation (Signed)
Occupational Therapy Evaluation Patient Details Name: Daniel [name] MRN: [id number] DOB: 1946-02-03 Today's Date: 07/20/2022   History of Present Illness Daniel [name] is a 76 y.o. 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  PMH positive for HTN and hernia repair.   Clinical Impression   PTA, pt lived with his daughter and reports being independent in ADL and driving, but reports that daughter did most of the cooking. Upon eval, pt presents with generalized weakness on L side of body, decreased balance, midline orientation, problem solving, attention, memory, and safety. Pt performing functional transfers with min A using RW, and fucntional mobility with up to mod A with lateral lean increasing with fatigue. Pt was observed with R lateral lean in sitting, as well, requiring up to min A to maintain figure for as well as for balance to don socks. Pt with fair awareness into deficits and with max attempts to self-correct. Due to pt motivation, support, and significant change in functional status, highly recommend discharge to AIR to optimize safety and independence in ADL and IADL.     Recommendations for follow up therapy are one component of a multi-disciplinary discharge planning process, led by the attending physician.  Recommendations may be updated based on patient status, additional functional criteria and insurance authorization.   Follow Up Recommendations  Acute inpatient rehab (3hours/day)    Assistance Recommended at Discharge Frequent or constant Supervision/Assistance  Patient can return home with the following A little help with bathing/dressing/bathroom;Assistance with cooking/housework;A lot of help with walking and/or transfers;Direct supervision/assist for medications management;Direct supervision/assist for financial management;Assist for transportation;Help with stairs or ramp for entrance    Functional  Status Assessment  Patient has had a recent decline in their functional status and demonstrates the ability to make significant improvements in function in a reasonable and predictable amount of time.  Equipment Recommendations  Other (comment) (defer)    Recommendations for Other Services Rehab consult     Precautions / Restrictions Precautions Precautions: Fall Restrictions Weight Bearing Restrictions: No      Mobility Bed Mobility Overal bed mobility: Needs Assistance Bed Mobility: Supine to Sit, Sit to Supine     Supine to sit: HOB elevated, Min guard Sit to supine: Min guard   General bed mobility comments: increased time, min guard for balance as posterior and R lateral lean; to supine significantly increased effort and time. Pt requesting to keep trying, so OT not providing assistance    Transfers Overall transfer level: Needs assistance Equipment used: Rolling walker (2 wheels) Transfers: Sit to/from Stand Sit to Stand: Min assist           General transfer comment: Assist for power up with bed in lowest position.      Balance Overall balance assessment: Needs assistance, History of Falls Sitting-balance support: Feet supported Sitting balance-Leahy Scale: Poor Sitting balance - Comments: leaning back and to R initially min A for balance, able to improve with time and donned socks on EOB with intermittent min A and cues for balance Postural control: Right lateral lean, Posterior lean Standing balance support: Bilateral upper extremity supported, During functional activity Standing balance-Leahy Scale: Poor Standing balance comment: Up to mod A.                           ADL either performed or assessed with clinical judgement   ADL Overall ADL's : Needs assistance/impaired  Eating/Feeding: Set up;Bed level   Grooming: Moderate assistance;Standing Grooming Details (indicate cue type and reason): Up to mod A for standing balance with RW. Would  anticipate need for mod A for balance during bil grooming tasks. Upper Body Bathing: Min guard;Sitting   Lower Body Bathing: Sit to/from stand;Minimal assistance   Upper Body Dressing : Minimal assistance;Sitting Upper Body Dressing Details (indicate cue type and reason): balance Lower Body Dressing: Minimal assistance;Sitting/lateral leans Lower Body Dressing Details (indicate cue type and reason): min A to maintain figure 4 to don socks. Noting decreased dexterity of L hand Toilet Transfer: Moderate assistance;Minimal assistance;Stand-pivot;Rolling walker (2 wheels);BSC/3in1 Toilet Transfer Details (indicate cue type and reason): Simulated in room         Functional mobility during ADLs: Minimal assistance;Moderate assistance;Rolling walker (2 wheels) General ADL Comments: Mod for functional mobility due to L lateral lean. Pt limited by balance, strength, cognition, and decreased midline orientation.     Vision Baseline Vision/History: 0 No visual deficits Ability to See in Adequate Light: 0 Adequate Patient Visual Report: No change from baseline Vision Assessment?: No apparent visual deficits Additional Comments: Pt reports no changes in vision. Pt potientially with mildly decreased attention to L side of environment.     Perception     Praxis      Pertinent Vitals/Pain Pain Assessment Pain Assessment: No/denies pain     Hand Dominance Right   Extremity/Trunk Assessment Upper Extremity Assessment Upper Extremity Assessment: Generalized weakness;LUE deficits/detail LUE Deficits / Details: Decreased speed of coordination, weaker as compared to R. Although R dominant, appears to be decreased as compared to baseline (4/5) LUE Coordination: decreased fine motor   Lower Extremity Assessment Lower Extremity Assessment: Defer to PT evaluation   Cervical / Trunk Assessment Cervical / Trunk Assessment: Other exceptions Cervical / Trunk Exceptions: generalized trunk  stiffness   Communication Communication Communication: HOH   Cognition Arousal/Alertness: Awake/alert Behavior During Therapy: WFL for tasks assessed/performed Overall Cognitive Status: Impaired/Different from baseline Area of Impairment: Attention, Following commands, Safety/judgement, Problem solving, Awareness                   Current Attention Level: Selective   Following Commands: Follows one step commands with increased time, Follows one step commands consistently, Follows multi-step commands inconsistently Safety/Judgement: Decreased awareness of deficits Awareness: Emergent, Anticipatory Problem Solving: Slow processing, Difficulty sequencing General Comments: Pt appears with fair cognition, frequently requiring repeat instructions due to being hard of hearing. Pt requiring verbal cues for safety during transfers and increased time for awareness of postural deficits sitting EOB and standing. Pt with good attempts to self-correct after initial cueing. Pt with good attempt to initiate solving simple money management problem, but ultimately requiring assistance to complete "you have 10 dollars and you spend 2.28, how much do you have left". Pt with skill to recall 2/3 words on delayed memory recall. No family present to determine baseline. Anticipatory awareness, reporting "I have been up a lot today and my balance is bad", "I don't think we should walk real far"     General Comments  VSS. Pt pleasant and conversational. Predominantly R lateral lean in sitting; L lateral lean in standing.    Exercises     Shoulder Instructions      Home Living Family/patient expects to be discharged to:: Private residence Living Arrangements: Children Available Help at Discharge: Family;Available 24 hours/day Type of Home: House Home Access: Stairs to enter [employer] of Steps: 3 Entrance Stairs-Rails: Right;Left Home Layout: One  level     Bathroom Shower/Tub: Research scientist (life sciences): Kasandra Knudsen - single point   Additional Comments: lives with daughter who works only one day a week and 75 y/o adopted daughter      Prior Functioning/Environment Prior Level of Function : Independent/Modified Independent;Driving             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, drives, 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, no injury and self recovery ADLs Comments: Pt reports independent in ADL and IADL        OT Problem List: Decreased strength;Decreased activity tolerance;Impaired balance (sitting and/or standing);Decreased coordination;Decreased cognition;Decreased safety awareness;Decreased knowledge of precautions;Impaired UE functional use      OT Treatment/Interventions: Self-care/ADL training;Therapeutic exercise;DME and/or AE instruction;Therapeutic activities;Cognitive remediation/compensation;Patient/family education;Balance training    OT Goals(Current goals can be found in the care plan section) Acute Rehab OT Goals Patient Stated Goal: Go home to dog OT Goal Formulation: With patient Time For Goal Achievement: 08/03/22 Potential to Achieve Goals: Good  OT Frequency: Min 2X/week    Co-evaluation              AM-PAC OT "6 Clicks" Daily Activity     Outcome Measure Help from another person eating meals?: A Little Help from another person taking care of personal grooming?: A Lot Help from another person toileting, which includes using toliet, bedpan, or urinal?: A Lot Help from another person bathing (including washing, rinsing, drying)?: A Little Help from another person to put on and taking off regular upper body clothing?: A Little Help from another person to put on and taking off regular lower body clothing?: A Lot 6 Click Score: 15   End of Session Equipment Utilized During Treatment: Gait belt;Rolling walker (2 wheels) Nurse Communication: Mobility status  Activity Tolerance: Patient tolerated  treatment well Patient left: in bed;with call bell/phone within reach;with bed alarm set  OT Visit Diagnosis: Unsteadiness on feet (R26.81);Other abnormalities of gait and mobility (R26.89);Muscle weakness (generalized) (M62.81);Other symptoms and signs involving cognitive function                Time: 9179-1505 OT Time Calculation (min): 30 min Charges:  OT General Charges $OT Visit: 1 Visit OT Evaluation $OT Eval Moderate Complexity: 1 Mod OT Treatments $Self Care/Home Management : 8-22 mins  Shanda Howells, OTR/L [facility] Acute Rehabilitation Office: [phone]   Lula Olszewski 07/20/2022, 4:02 PM

## 2022-07-20 NOTE — Progress Notes (Addendum)
Daily Progress Note Intern Pager: [phone]  Patient name: Daniel [name] Medical record number: [id number] Date of birth: 1946-01-24 Age: 76 y.o. Gender: male  Primary Care Provider: Lind Covert, MD Consultants: Neuro Code Status: DNR  Pt Overview and Major Events to Date:  8/31 - admitted, found to have CVA  Assessment and Plan: 76 y.o. male with a past medical history of HTN presenting with left lower extremity weakness, ataxia, lightheadedness, presyncope, positive Romberg admitted due to small acute infarct right centrum semiovale (right thalamus) confirmed by MRI.  * CVA (cerebral vascular accident) ([place]) MRI showed small acute infarct right centrum semiovale. Risk stratifications so far notable for LDL 87, A1c 6.1%.  -S/p ASA 325 mg, Plavix 300 mg x1 -Start DAPT with ASA 81 mg, Plavix 75 mg today -Heart healthy diet (passed bedside swallow) -PT/OT recommendations -Continuous cardiac monitoring for arrhythmia  -U/S Carotid pending   -Start high-intensity statin  -Allow for permissive HTN -Neuro checks q4h  -Neuro following, appreciate care and recommendations  Prediabetes Hgb A1c 6.1%. -Starting statin as above for secondary prevention given CVA -Lifestyle modifications   Essential hypertension On Losartan 50 mg at home. BP in office was >400 systolic.  -- Hold Losartan to allow for permissive HTN -- Await Neuro recommendations    FEN/GI: Heart healthy PPx: Lovenox Dispo: Acute inpatient rehab (3h/day)   pending coordination . Pending rehab consult.   Subjective:  Daniel [name] was seen at bedside resting comfortably. Denies chest pain, SOB, dizziness/lightheadedness, headache, vision changes. Ate all of his breakfast. Slept through the night.   Objective: Temp:  [97.5 F (36.4 C)-98.4 F (36.9 C)] 98 F (36.7 C) (09/01 1203) Pulse Rate:  [39-63] 61 (09/01 1203) Resp:  [14-19] 16 (09/01 1203) BP: (131-195)/(60-99) 136/60 (09/01 1203) SpO2:   [95 %-99 %] 95 % (09/01 1203)  Physical Exam: General: Aox4. Pleasant and conversational. Not ill-appearing  Cardiovascular: RRR S1 S2 present. No rubs murmurs or gallops.  Respiratory: CTA bilaterally. Respiratory effort normal with no increased WOB.  Abdomen: Soft, non distended, no tenderness to palpation  Extremities: Dorsalis pedis pulses 2+ bilaterally.   Neuro: No focal neurologic deficits (No facial droop, facial movements intact, no dysarthria, extraocular movements intact bilaterally). Strength 5/5 in upper extremities bilaterally. Slightly decreased strength in the left lower extremity 4-5/5. Difficulty with ambulation, drifting to the right with left leg dragging, unsteady gait, unsteady truncal equilibrium during tasks when there is no back support present  Laboratory: Most recent CBC Lab Results  Component Value Date   WBC 7.2 07/19/2022   HGB 12.6 (L) 07/19/2022   HCT 37.0 (L) 07/19/2022   MCV 88.5 07/19/2022   PLT 228 07/19/2022   Most recent BMP    Latest Ref Rng & Units 07/19/2022    2:41 PM  BMP  Glucose 70 - 99 mg/dL 98   BUN 8 - 23 mg/dL 19   Creatinine 0.61 - 1.24 mg/dL 0.90   Sodium 135 - 145 mmol/L 137   Potassium 3.5 - 5.1 mmol/L 4.0   Chloride 98 - 111 mmol/L 102    HbA1c 6.1   Imaging/Diagnostic Tests: CT HEAD WO CONTRAST  IMPRESSION: 1. Small focus of hypodensity in the right thalamic capsular region is age indeterminate but could reflect a small acute or subacute infarct. Consider brain MRI for further evaluation. 2. No evidence of large vessel territorial infarct or hemorrhage.  MR BRAIN W AND WO CONTRAST  IMPRESSION: 1. Small acute infarct right centrum  semiovale 2. 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 Motion degraded study of the brain. 3. MRA is limited by vessel tortuosity and motion. No large vessel occlusion identified  MR ANGIO HEAD WO CONTRAST IMPRESSION: 1. Small  acute infarct right centrum semiovale 2. 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 Motion degraded study of the brain. 3. MRA is limited by vessel tortuosity and motion. No large vessel occlusion identified  ECHO IMPRESSIONS   1. Left ventricular ejection fraction, by estimation, is 55 to 60%. Left  ventricular ejection fraction by PLAX is 58 %. The left ventricle has  normal function. The left ventricle has no regional wall motion  abnormalities. Left ventricular diastolic  parameters are consistent with Grade I diastolic dysfunction (impaired  relaxation). Elevated left ventricular end-diastolic pressure.   2. Right ventricular systolic function is normal. The right ventricular  size is normal. There is normal pulmonary artery systolic pressure.   3. Left atrial size was mildly dilated.   4. The mitral valve is normal in structure. Trivial mitral valve  regurgitation. No evidence of mitral stenosis.   5. The aortic valve is calcified. There is mild calcification of the  aortic valve. There is mild thickening of the aortic valve. Aortic valve  regurgitation is trivial. No aortic stenosis is present.   6. Aortic dilatation noted. There is mild dilatation of the ascending  aorta, measuring 37 mm.   7. The inferior vena cava is normal in size with greater than 50%  respiratory variability, suggesting right atrial pressure of 3 mmHg.    Lupita Raider, Medical Student 07/20/2022, 1:48 PM [place] Intern pager: [phone], text pages welcome Secure chat group [facility] Upper-Level Resident Addendum   I have independently interviewed and examined the patient. I have discussed the above with the original author and agree with their documentation. My edits for correction/addition/clarification are in within the document. Please see also any attending notes.    Rise Patience, DO  PGY-3, [facility] 07/20/2022 2:26 PM  [facility] pager: [phone] (text pages welcome through [facility])

## 2022-07-20 NOTE — Plan of Care (Signed)
  Problem: Health Behavior/Discharge Planning: Goal: Ability to manage health-related needs will improve Outcome: Progressing   Problem: Self-Care: Goal: Ability to participate in self-care as condition permits will improve Outcome: Progressing

## 2022-07-20 NOTE — TOC Progression Note (Signed)
Transition of Care [facility]) - Progression Note    Patient Details  Name: Daniel [name] MRN: [id number] Date of Birth: June 20, 1946  Transition of Care [facility]) CM/SW [place], RN Phone Number:[phone]  07/20/2022, 3:58 PM  Clinical Narrative:     Transition of Care (TOC) Screening Note   Patient Details  Name: Daniel [name] Date of Birth: Apr 09, 1946   Transition of Care [facility]) CM/SW Contact:    Angelita Ingles, RN Phone Number: 07/20/2022, 3:59 PM    Transition of Care Department [facility]) has reviewed patient and no TOC needs have been identified at this time. We will continue to monitor patient advancement through interdisciplinary progression rounds.           Expected Discharge Plan and Services                                                 Social Determinants of Health (SDOH) Interventions    Readmission Risk Interventions     No data to display

## 2022-07-20 NOTE — Plan of Care (Signed)
?  Problem: Education: ?Goal: Knowledge of General Education information will improve ?Description: Including pain rating scale, medication(s)/side effects and non-pharmacologic comfort measures ?Outcome: Progressing ?  ?Problem: Health Behavior/Discharge Planning: ?Goal: Ability to manage health-related needs will improve ?Outcome: Progressing ?  ?Problem: Clinical Measurements: ?Goal: Ability to maintain clinical measurements within normal limits will improve ?Outcome: Progressing ?Goal: Will remain free from infection ?Outcome: Progressing ?Goal: Diagnostic test results will improve ?Outcome: Progressing ?Goal: Respiratory complications will improve ?Outcome: Progressing ?Goal: Cardiovascular complication will be avoided ?Outcome: Progressing ?  ?Problem: Activity: ?Goal: Risk for activity intolerance will decrease ?Outcome: Progressing ?  ?Problem: Nutrition: ?Goal: Adequate nutrition will be maintained ?Outcome: Progressing ?  ?Problem: Coping: ?Goal: Level of anxiety will decrease ?Outcome: Progressing ?  ?Problem: Elimination: ?Goal: Will not experience complications related to bowel motility ?Outcome: Progressing ?Goal: Will not experience complications related to urinary retention ?Outcome: Progressing ?  ?Problem: Pain Managment: ?Goal: General experience of comfort will improve ?Outcome: Progressing ?  ?Problem: Safety: ?Goal: Ability to remain free from injury will improve ?Outcome: Progressing ?  ?Problem: Skin Integrity: ?Goal: Risk for impaired skin integrity will decrease ?Outcome: Progressing ?  ?Problem: Education: ?Goal: Knowledge of disease or condition will improve ?Outcome: Progressing ?Goal: Knowledge of secondary prevention will improve (SELECT ALL) ?Outcome: Progressing ?Goal: Knowledge of patient specific risk factors will improve (INDIVIDUALIZE FOR PATIENT) ?Outcome: Progressing ?Goal: Individualized Educational Video(s) ?Outcome: Progressing ?  ?Problem: Health Behavior/Discharge  Planning: ?Goal: Ability to manage health-related needs will improve ?Outcome: Progressing ?  ?Problem: Self-Care: ?Goal: Ability to participate in self-care as condition permits will improve ?Outcome: Progressing ?  ?Problem: Ischemic Stroke/TIA Tissue Perfusion: ?Goal: Complications of ischemic stroke/TIA will be minimized ?Outcome: Progressing ?  ?

## 2022-07-20 NOTE — Progress Notes (Signed)
Bilateral carotid duplex study completed. Please see CV Proc for preliminary results.  Richell Corker BS, RVT 07/20/2022 10:10 AM

## 2022-07-21 DIAGNOSIS — I639 Cerebral infarction, unspecified: Secondary | ICD-10-CM | POA: Diagnosis not present

## 2022-07-21 MED ORDER — ORAL CARE MOUTH RINSE: 15.0000 mL | OROMUCOSAL | Status: DC | PRN

## 2022-07-21 NOTE — Progress Notes (Signed)
Physical Therapy Treatment Patient Details Name: Daniel [name] MRN: [id number] DOB: 1946-08-29 Today's Date: 07/21/2022   History of Present Illness Daniel [name] is a 76 y.o. 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  PMH positive for HTN and hernia repair.    PT Comments    Pt in bed on arrival and willing to participate with therapy. His two daughters were present for session. Pt appeared more unsteady with gait this session based on previous notes. However he is beginning to mobilize his left LE and UE better. Pt remains appropriate for AIR prior to d/c home. Will continue to follow acutely.    Recommendations for follow up therapy are one component of a multi-disciplinary discharge planning process, led by the attending physician.  Recommendations may be updated based on patient status, additional functional criteria and insurance authorization.  Follow Up Recommendations  Acute inpatient rehab (3hours/day)     Assistance Recommended at Discharge Frequent or constant Supervision/Assistance  Patient can return home with the following A lot of help with walking and/or transfers;A lot of help with bathing/dressing/bathroom;Direct supervision/assist for medications management;Help with stairs or ramp for entrance;Assist for transportation;Assistance with cooking/housework   Equipment Recommendations  Rolling walker (2 wheels)    Recommendations for Other Services Rehab consult     Precautions / Restrictions Precautions Precautions: Fall Restrictions Weight Bearing Restrictions: No     Mobility  Bed Mobility Overal bed mobility: Needs Assistance Bed Mobility: Supine to Sit     Supine to sit: HOB elevated, Min assist     General bed mobility comments: min A for trunk elevation.    Transfers Overall transfer level: Needs assistance Equipment used: Rolling walker (2 wheels) Transfers: Sit to/from  Stand Sit to Stand: Min assist           General transfer comment: Assist for power up    Ambulation/Gait Ambulation/Gait assistance: Mod assist, +2 physical assistance, +2 safety/equipment Gait Distance (Feet): 20 Feet (20x1 15x1) Assistive device: Rolling walker (2 wheels), 2 person hand held assist Gait Pattern/deviations: Step-to pattern, Step-through pattern, Decreased stride length, Decreased dorsiflexion - left, Drifts right/left, Wide base of support, Ataxic, Decreased step length - right, Decreased step length - left, Decreased weight shift to right, Knees buckling, Trunk flexed Gait velocity: slowed Gait velocity interpretation: <1.31 ft/sec, indicative of household ambulator   General Gait Details: attempted gait with +2 HHA. Physical assist required at hips to assist with weight shift to the R. Cues for weight shift. Pt very unsteady with heavy lean to the L with ambulation. attempted additional ambulation with RW. Pt did poorly with RW as he kept his knees and trunk flexed with RW. Switched to +2HHA for remainder of session.   Stairs             Wheelchair Mobility    Modified Rankin (Stroke Patients Only) Modified Rankin (Stroke Patients Only) Pre-Morbid Rankin Score: No significant disability Modified Rankin: Moderately severe disability     Balance Overall balance assessment: Needs assistance, History of Falls Sitting-balance support: Feet supported Sitting balance-Leahy Scale: Poor Sitting balance - Comments: Requiring assist to correct R lean. Pt aware he is leaning and was able to hold himself upright with use of bedrail. Postural control: Right lateral lean Standing balance support: Bilateral upper extremity supported, During functional activity Standing balance-Leahy Scale: Poor Standing balance comment: Up to mod A.  Cognition Arousal/Alertness: Awake/alert Behavior During Therapy: WFL for tasks  assessed/performed Overall Cognitive Status: Impaired/Different from baseline Area of Impairment: Attention, Following commands, Safety/judgement, Problem solving, Awareness                   Current Attention Level: Selective   Following Commands: Follows one step commands with increased time, Follows one step commands consistently, Follows multi-step commands inconsistently Safety/Judgement: Decreased awareness of deficits Awareness: Emergent, Anticipatory Problem Solving: Slow processing, Difficulty sequencing General Comments: Pt appears with fair cognition, frequently requiring repeat instructions due to being hard of hearing. Pt requiring verbal cues for safety during transfers and increased time for awareness of postural deficits sitting EOB and standing. Pt with good attempts to self-correct after initial cueing.        Exercises      General Comments General comments (skin integrity, edema, etc.): Pt's two daughters preasent during session.      Pertinent Vitals/Pain Pain Assessment Pain Assessment: No/denies pain    Home Living                          Prior Function            PT Goals (current goals can now be found in the care plan section) Acute Rehab PT Goals Patient Stated Goal: to go home PT Goal Formulation: With patient/family Time For Goal Achievement: 08/03/22 Potential to Achieve Goals: Good Progress towards PT goals: Progressing toward goals    Frequency    Min 4X/week      PT Plan Current plan remains appropriate    Co-evaluation              AM-PAC PT "6 Clicks" Mobility   Outcome Measure  Help needed turning from your back to your side while in a flat bed without using bedrails?: A Little Help needed moving from lying on your back to sitting on the side of a flat bed without using bedrails?: A Little Help needed moving to and from a bed to a chair (including a wheelchair)?: A Lot Help needed standing up from a  chair using your arms (e.g., wheelchair or bedside chair)?: A Lot Help needed to walk in hospital room?: A Lot Help needed climbing 3-5 steps with a railing? : A Lot 6 Click Score: 14    End of Session Equipment Utilized During Treatment: Gait belt Activity Tolerance: Patient tolerated treatment well Patient left: with family/visitor present;in chair;with chair alarm set Nurse Communication: Mobility status PT Visit Diagnosis: Other abnormalities of gait and mobility (R26.89);History of falling (Z91.81);Ataxic gait (R26.0)     Time: 5397-6734 PT Time Calculation (min) (ACUTE ONLY): 25 min  Charges:  $Gait Training: 8-22 mins $Neuromuscular Re-education: 8-22 mins                    Benjiman Core, PTA Acute Rehab  Allena Katz 07/21/2022, 2:12 PM

## 2022-07-21 NOTE — Plan of Care (Signed)
  Problem: Education: Goal: Knowledge of General Education information will improve Description: Including pain rating scale, medication(s)/side effects and non-pharmacologic comfort measures Outcome: Progressing   Problem: Health Behavior/Discharge Planning: Goal: Ability to manage health-related needs will improve Outcome: Progressing   Problem: Clinical Measurements: Goal: Ability to maintain clinical measurements within normal limits will improve Outcome: Progressing Goal: Will remain free from infection Outcome: Progressing Goal: Diagnostic test results will improve Outcome: Progressing Goal: Respiratory complications will improve Outcome: Progressing Goal: Cardiovascular complication will be avoided Outcome: Progressing   Problem: Nutrition: Goal: Adequate nutrition will be maintained Outcome: Progressing   Problem: Coping: Goal: Level of anxiety will decrease Outcome: Progressing   Problem: Elimination: Goal: Will not experience complications related to bowel motility Outcome: Progressing Goal: Will not experience complications related to urinary retention Outcome: Progressing   Problem: Pain Managment: Goal: General experience of comfort will improve Outcome: Progressing   Problem: Safety: Goal: Ability to remain free from injury will improve Outcome: Progressing   Problem: Skin Integrity: Goal: Risk for impaired skin integrity will decrease Outcome: Progressing   Problem: Education: Goal: Knowledge of disease or condition will improve Outcome: Progressing Goal: Knowledge of secondary prevention will improve (SELECT ALL) Outcome: Progressing Goal: Knowledge of patient specific risk factors will improve (INDIVIDUALIZE FOR PATIENT) Outcome: Progressing Goal: Individualized Educational Video(s) Outcome: Progressing   Problem: Health Behavior/Discharge Planning: Goal: Ability to manage health-related needs will improve Outcome: Progressing   Problem:  Self-Care: Goal: Ability to participate in self-care as condition permits will improve Outcome: Progressing   Problem: Ischemic Stroke/TIA Tissue Perfusion: Goal: Complications of ischemic stroke/TIA will be minimized Outcome: Progressing

## 2022-07-21 NOTE — Progress Notes (Addendum)
     Daily Progress Note Intern Pager: [phone]  Patient name: Daniel [name] Medical record number: [id number] Date of birth: November 06, 1946 Age: 76 y.o. Gender: male  Primary Care Provider: Lind Covert, MD Consultants: neurology  Code Status:   Pt Overview and Major Events to Date:  8/31 - admitted, w/ CVA   Assessment and Plan:  76 y.o. male with a past medical history of HTN presenting with left lower extremity weakness, ataxia, lightheadedness, presyncope, positive Romberg admitted due to small acute infarct right centrum semiovale (right thalamus) confirmed by MRI.  * CVA (cerebral vascular accident) ([place]) MRI showed small acute infarct right centrum semiovale. Carotid U/S <40% stenosis bilateral. Risk stratifications so far notable for LDL 87, A1c 6.1%. Planning acute inpatient rehab for dispo.  -Plavix + ASA for 3 weeks, followed by ASA only.  -Continuous cardiac monitoring for arrhythmia, none noted at this time.  -Start high-intensity statin  -Allow for permissive HTN, medication restart per neurology  -Neuro checks q4h  -Neuro following, appreciate care and recommendations  Prediabetes Hgb A1c 6.1%. -Starting statin as above for secondary prevention given CVA -Lifestyle modifications   Essential hypertension On Losartan 50 mg at home. BP in office was >476 systolic.  -- Hold Losartan to allow for permissive HTN -- Await Neuro recommendations    FEN/GI: heart healthy  PPx: Lovenox Dispo:CIR  coordination . Barriers include rehab consult.   Subjective:  Patient resting comfortably.  No acute events overnight.  Patient has no new complaints.  He is still agreeable to go to CIR.  Objective: Temp:  [97.7 F (36.5 C)-98.3 F (36.8 C)] 97.7 F (36.5 C) (09/01 1541) Pulse Rate:  [57-62] 61 (09/01 1541) Resp:  [16-17] 16 (09/01 1541) BP: (136-175)/(60-99) 175/94 (09/01 1541) SpO2:  [95 %-98 %] 98 % (09/01 1541) Physical Exam: General: Well-appearing, no  acute distress Cardiovascular: Regular rate, regular rhythm, no murmurs on exam Respiratory: Clear, no wheezing, no consolidations, no crackles, no increased work of breathing Abdomen: Soft, nontender Extremities: No peripheral edema.  Strength equal when isolated on exam.  Noted left-sided deficit with PT.  Laboratory: Most recent CBC Lab Results  Component Value Date   WBC 7.2 07/19/2022   HGB 12.6 (L) 07/19/2022   HCT 37.0 (L) 07/19/2022   MCV 88.5 07/19/2022   PLT 228 07/19/2022   Most recent BMP    Latest Ref Rng & Units 07/19/2022    2:41 PM  BMP  Glucose 70 - 99 mg/dL 98   BUN 8 - 23 mg/dL 19   Creatinine 0.61 - 1.24 mg/dL 0.90   Sodium 135 - 145 mmol/L 137   Potassium 3.5 - 5.1 mmol/L 4.0   Chloride 98 - 111 mmol/L 102     Darci Current, DO 07/21/2022, 3:39 AM  PGY-1, [place] Intern pager: [phone], text pages welcome Secure chat group [place]

## 2022-07-22 ENCOUNTER — Inpatient Hospital Stay (HOSPITAL_COMMUNITY): Payer: Medicare HMO

## 2022-07-22 DIAGNOSIS — I639 Cerebral infarction, unspecified: Secondary | ICD-10-CM | POA: Diagnosis not present

## 2022-07-22 LAB — CBC
HCT: 39.2 % (ref 39.0–52.0)
Hemoglobin: 13.3 g/dL (ref 13.0–17.0)
MCH: 29.1 pg (ref 26.0–34.0)
MCHC: 33.9 g/dL (ref 30.0–36.0)
MCV: 85.8 fL (ref 80.0–100.0)
Platelets: 224 10*3/uL (ref 150–400)
RBC: 4.57 MIL/uL (ref 4.22–5.81)
RDW: 13.1 % (ref 11.5–15.5)
WBC: 9.5 10*3/uL (ref 4.0–10.5)
nRBC: 0 % (ref 0.0–0.2)

## 2022-07-22 NOTE — Progress Notes (Addendum)
Nurse reported patient unable to squeeze with L hand and has increased weakness compared to prior shifts. Different than recorded in daytime note.   Neuro: Mental Status: Patient is awake, alert, oriented to person, place, month, year, and situation. Patient is able to give a clear and coherent history. No signs of aphasia or neglect Cranial Nerves: II: Pupils are equal, round, and reactive to light.   III,IV, VI: EOMI without ptosis or diploplia.  V: Facial sensation is symmetric to light touch and symmetric resting and smiling.  VIII: Hearing is intact to voice X: Palate elevates symmetrically XI: Shoulder shrug is asymmetric. Unable to elevate on L.  XII: Tongue protrudes midline without atrophy or fasciculations.  Motor: 0/5 strength was present in LUE. Weak grip with L hand. 1/5 strength in LLE. Able to move laterally. 5/5 strength in RUE and RLE.  Sensory: Sensation is symmetric to light touch in the arms and legs.  Cerebellar: FNF intact on right side. Unable to on left due to weakness.   Given focal neurological deficit and worsening compared to charted prior exams, repeated CT head to f/u on prior stroke.

## 2022-07-22 NOTE — Progress Notes (Signed)
Inpatient Rehab Admissions Coordinator:    I met with Pt. To discuss potential CIR admit. He is interested , daughter can provide 24/7 support. I will work on Dentist and pursue for admit.   Clemens Catholic, [place], [place] Admissions Coordinator  [phone] ([place]) [phone] (office)

## 2022-07-22 NOTE — Plan of Care (Signed)
  Problem: Education: Goal: Knowledge of disease or condition will improve Outcome: Progressing Goal: Knowledge of secondary prevention will improve (SELECT ALL) Outcome: Progressing Goal: Knowledge of patient specific risk factors will improve (INDIVIDUALIZE FOR PATIENT) Outcome: Progressing   Problem: Self-Care: Goal: Ability to participate in self-care as condition permits will improve Outcome: Progressing   Problem: Ischemic Stroke/TIA Tissue Perfusion: Goal: Complications of ischemic stroke/TIA will be minimized Outcome: Progressing

## 2022-07-22 NOTE — Plan of Care (Signed)
  Problem: Education: Goal: Knowledge of General Education information will improve Description: Including pain rating scale, medication(s)/side effects and non-pharmacologic comfort measures Outcome: Progressing   Problem: Health Behavior/Discharge Planning: Goal: Ability to manage health-related needs will improve Outcome: Progressing   Problem: Clinical Measurements: Goal: Ability to maintain clinical measurements within normal limits will improve Outcome: Progressing Goal: Will remain free from infection Outcome: Progressing Goal: Diagnostic test results will improve Outcome: Progressing Goal: Respiratory complications will improve Outcome: Progressing Goal: Cardiovascular complication will be avoided Outcome: Progressing   Problem: Nutrition: Goal: Adequate nutrition will be maintained Outcome: Progressing   Problem: Coping: Goal: Level of anxiety will decrease Outcome: Progressing   Problem: Elimination: Goal: Will not experience complications related to bowel motility Outcome: Progressing Goal: Will not experience complications related to urinary retention Outcome: Progressing   Problem: Pain Managment: Goal: General experience of comfort will improve Outcome: Progressing   Problem: Safety: Goal: Ability to remain free from injury will improve Outcome: Progressing   Problem: Skin Integrity: Goal: Risk for impaired skin integrity will decrease Outcome: Progressing   Problem: Education: Goal: Knowledge of disease or condition will improve Outcome: Progressing Goal: Knowledge of secondary prevention will improve (SELECT ALL) Outcome: Progressing Goal: Knowledge of patient specific risk factors will improve (INDIVIDUALIZE FOR PATIENT) Outcome: Progressing Goal: Individualized Educational Video(s) Outcome: Progressing   Problem: Health Behavior/Discharge Planning: Goal: Ability to manage health-related needs will improve Outcome: Progressing   Problem:  Self-Care: Goal: Ability to participate in self-care as condition permits will improve Outcome: Progressing   Problem: Ischemic Stroke/TIA Tissue Perfusion: Goal: Complications of ischemic stroke/TIA will be minimized Outcome: Progressing

## 2022-07-22 NOTE — Progress Notes (Signed)
@  2030 Dr. Lurline Hare, on-call for attending paged regarding pt's decreased strength on L side when compared to day RN shift assessment and Day MD assessment (see flowsheet for comparison). Page promptly returned and STAT Head ordered. Pt updated on MD plan and amenable. Will continue to monitor and assess.  Addendum: When questioned, pt affirms that he doesn't feel like his current weakness is significantly different from this morning.

## 2022-07-22 NOTE — Progress Notes (Addendum)
     Daily Progress Note Intern Pager: [phone]  Patient name: Daniel [name] Medical record number: [id number] Date of birth: 1946/08/10 Age: 76 y.o. Gender: male  Primary Care Provider: Lind Covert, MD Consultants: Neurology  Code Status: DNR  Pt Overview and Major Events to Date:  8/31: Admitted   Assessment and Plan: 76 y.o. 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   * CVA (cerebral vascular accident) ([place]) MRI showed small acute infarct right centrum semiovale. Carotid U/S <40% stenosis bilateral. Risk stratifications so far notable for LDL 87, A1c 6.1%. Planning acute inpatient rehab for dispo.  -Neurology following, appreciate continued involvement and recs -Plavix + ASA for 3 weeks, followed by ASA only.  -Continuous cardiac monitoring for arrhythmia -Continue crestor daily  -Allow for permissive HTN, restart antihypertensive medication per neurology  -Neuro checks q4h  -Potential disposition CIR  Prediabetes Hgb A1c 6.1%. -Continue statin for secondary prevention given CVA -Encourage continued lifestyle modifications for improved preventative measures   Essential hypertension Home meds include losartan 50 mg.  -monitor BP -continue to hold losartan while in permissive hypertension window, restart per neuro recs -patient will need continued BP control outpatient to prevent future stroke        FEN/GI: heart healthy  PPx: lovenox  Dispo:CIR pending clinical improvement . Awaiting CIR approval.   Subjective:  No acute overnight events reported. Patient says that he has only been able to sleep 2 hours last night. Denies dyspnea, weakness, pain, numbness or tingling. Reports that his left side still feels weak.   Objective: Temp:  [98.2 F (36.8 C)-98.3 F (36.8 C)] 98.2 F (36.8 C) (09/03  0408) Pulse Rate:  [57-85] 57 (09/03 0408) Resp:  [16-20] 20 (09/03 0408) BP: (137-166)/(74-89) 148/76 (09/03 0408) SpO2:  [94 %-98 %] 94 % (09/03 0408) Physical Exam: General: Patient laying comfortably in bed, in no acute distress.  Cardiovascular: RRR, no murmurs or gallops auscultated  Respiratory: CTAB, no wheezing, rales or rhonchi noted Abdomen: soft, nontender, nondistended, presence of bowel sounds Extremities: no LE edema noted bilaterally, distal pulses strong and equal bilaterally  Neuro: CN 2-12 grossly intact, no facial asymmetry, 5/5 right UE and LE strength, 5/5 grip strength bilaterally, 4/5 left UE and LE strength bilaterally, gross sensation intact  Laboratory: Most recent CBC Lab Results  Component Value Date   WBC 9.5 07/22/2022   HGB 13.3 07/22/2022   HCT 39.2 07/22/2022   MCV 85.8 07/22/2022   PLT 224 07/22/2022   Most recent BMP    Latest Ref Rng & Units 07/19/2022    2:41 PM  BMP  Glucose 70 - 99 mg/dL 98   BUN 8 - 23 mg/dL 19   Creatinine 0.61 - 1.24 mg/dL 0.90   Sodium 135 - 145 mmol/L 137   Potassium 3.5 - 5.1 mmol/L 4.0   Chloride 98 - 111 mmol/L 102       Imaging/Diagnostic Tests: No results found.   Donney Dice, DO 07/22/2022, 6:40 AM  PGY-3, [place] Intern pager: [phone], text pages welcome Secure chat group [place]

## 2022-07-22 NOTE — Plan of Care (Signed)
  Problem: Education: Goal: Knowledge of disease or condition will improve Outcome: Progressing Goal: Knowledge of secondary prevention will improve (SELECT ALL) Outcome: Progressing Goal: Knowledge of patient specific risk factors will improve (INDIVIDUALIZE FOR PATIENT) Outcome: Progressing   Problem: Health Behavior/Discharge Planning: Goal: Ability to manage health-related needs will improve Outcome: Progressing   Problem: Self-Care: Goal: Ability to participate in self-care as condition permits will improve Outcome: Progressing   Problem: Ischemic Stroke/TIA Tissue Perfusion: Goal: Complications of ischemic stroke/TIA will be minimized Outcome: Progressing

## 2022-07-22 NOTE — PMR Pre-admission (Signed)
PMR Admission Coordinator Pre-Admission Assessment  Patient: Daniel [name] is a 76 y.o., male MRN: [id number] DOB: 1946-01-23 Height: '5\' 8"'  (172.7 cm) Weight:    Insurance Information HMO:     PPO: yes      PCP:      IPA:      80/20:      OTHER:  PRIMARY: [employer] Medicare       Policy#: [id number]      Subscriber: Pt CM Name: Marcene Brawn       Phone#:  [phone]    Fax#:  [phone] Pt. Approved 1/6-0/73 Pre-Cert#: [id number]      Employer:  Benefits:  Phone #:      Name:  Irene Shipper Date: 11/19/2020 - still active Deductible: does not have OOP Max: $4,500 ($0 met) CIR: $295/day co-pay with a max co-pay of $1,770/admission (6 days) SNF: $0/day co-pay for days 1-20, $196/day co-pay for days 21-100; limited to 100 days/cal yr. Outpatient:  $35/visit co-pay Home Health:  100% coverage DME: 80% coverage; 20% co-insurance Providers: in network  SECONDARY:       Policy#:      Phone#:   Development worker, community:       Phone#:   [employer], [employer]" for patients in Inpatient Rehabilitation Facilities with attached "Privacy Act [place] Records" was provided and verbally reviewed with: Family  Emergency Contact Information Contact Information     Name Relation Home Work [place] Daughter   [phone]   Tadhg, Eskew   [phone]       Current Medical History  Patient Admitting Diagnosis: CVA  History of Present Illness: Daniel [name] is a 76 year old right-handed male with history of hypertension as well as former tobacco use.  Per chart review patient lives with his daughter.  1 level home 3 steps to entry.  Independent driving prior to admission.  Presented 07/19/2022 with left-sided weakness and dizziness of acute onset.  No nausea vomiting or fever reported.  Blood pressure 205/120.  CT/MRI showed small acute infarct right centrum semiovale.  Mild to moderate chronic microvascular ischemic change involving the white matter and right  thalamus.  MRA showed no large vessel occlusion.  Admission chemistry unremarkable except glucose 103, alcohol negative, urine drug screen negative.  Echocardiogram with ejection fraction of 55 to 60% no wall motion abnormalities grade 1 diastolic dysfunction.  Neurology follow-up placed on aspirin as well as Plavix for CVA prophylaxis.  Hospital course increasing left-sided weakness 07/23/2022 with MRI completed showing increased size of acute infarct involving the right corona radiata and basal ganglia.  MRI findings discussed with neurology no specific changes recommended and remains on low-dose aspirin with Plavix x3 weeks followed by aspirin alone.  Placed on Lovenox for DVT prophylaxis.  Monitoring of permissive hypertension patient on losartan 50 mg prior to admission.  Tolerating a regular consistency diet.  Therapy evaluations completed due to patient's left-sided weakness and decreased functional mobility was admitted for a comprehensive rehab program   Complete NIHSS TOTAL: 5  [name] medical record from [facility]  has been reviewed by the rehabilitation admission coordinator and physician.  Past Medical History  Past Medical History:  Diagnosis Date   Hernia of fascia 2008   repair with many complications    Hypertension     Has the patient had major surgery during 100 days prior to admission? No  Family History   family history includes Alcohol abuse in his father; Depression in his sister; High  Cholesterol in his sister; Hypertension in his brother, mother, and sister; Osteoporosis in his mother; Thyroid disease in his mother.  Current Medications  Current Facility-Administered Medications:    acetaminophen (TYLENOL) tablet 650 mg, 650 mg, Oral, Q4H PRN **OR** [DISCONTINUED] acetaminophen (TYLENOL) 160 MG/5ML solution 650 mg, 650 mg, Per Tube, Q4H PRN **OR** acetaminophen (TYLENOL) suppository 650 mg, 650 mg, Rectal, Q4H PRN, Espinoza, Alejandra, DO   aspirin  EC tablet 81 mg, 81 mg, Oral, Daily, Espinoza, Alejandra, DO, 81 mg at 07/22/22 1046   clopidogrel (PLAVIX) tablet 75 mg, 75 mg, Oral, Daily, Espinoza, Alejandra, DO, 75 mg at 07/22/22 1046   enoxaparin (LOVENOX) injection 40 mg, 40 mg, Subcutaneous, Q24H, Espinoza, Alejandra, DO, 40 mg at 07/21/22 2148   Oral care mouth rinse, 15 mL, Mouth Rinse, PRN, Leeanne Rio, MD   rosuvastatin (CRESTOR) tablet 20 mg, 20 mg, Oral, Daily, Espinoza, Alejandra, DO, 20 mg at 07/22/22 1046   senna-docusate (Senokot-S) tablet 1 tablet, 1 tablet, Oral, QHS PRN, Sharion Settler, DO  Patients Current Diet:  Diet Order             Diet Heart Room service appropriate? Yes; Fluid consistency: Thin  Diet effective now                   Precautions / Restrictions Precautions Precautions: Fall Restrictions Weight Bearing Restrictions: No   Has the patient had 2 or more falls or a fall with injury in the past year? Yes  Prior Activity Level Community (5-7x/wk): Pt. went out daily PTA  Prior Functional Level Self Care: Did the patient need help bathing, dressing, using the toilet or eating? Independent  Indoor Mobility: Did the patient need assistance with walking from room to room (with or without device)? Independent  Stairs: Did the patient need assistance with internal or external stairs (with or without device)? Independent  Functional Cognition: Did the patient need help planning regular tasks such as shopping or remembering to take medications? Needed some help  Patient Information Are you of Hispanic, Latino/a,or Spanish origin?: A. No, not of Hispanic, Latino/a, or Spanish origin What is your race?: A. White Do you need or want an interpreter to communicate with a doctor or health care staff?: 0. No  [name] Response To:  Health Literacy and Transportation Is the patient able to respond to health literacy and transportation needs?: Yes Health Literacy - How often do you  need to have someone help you when you read instructions, pamphlets, or other written material from your doctor or pharmacy?: Never In the past 12 months, has lack of transportation kept you from medical appointments or from getting medications?: No In the past 12 months, has lack of transportation kept you from meetings, work, or from getting things needed for daily living?: No  Home Assistive Devices / [place] Devices/Equipment: Blood pressure cuff Home Equipment: Cane - single point  Prior Device Use: Indicate devices/aids used by the patient prior to current illness, exacerbation or injury? None of the above  Current Functional Level Cognition  Overall Cognitive Status: Impaired/Different from baseline Current Attention Level: Selective Orientation Level: Oriented X4 Following Commands: Follows one step commands with increased time, Follows one step commands consistently, Follows multi-step commands inconsistently Safety/Judgement: Decreased awareness of deficits General Comments: Pt appears with fair cognition, frequently requiring repeat instructions due to being hard of hearing. Pt requiring verbal cues for safety during transfers and increased time for awareness of postural deficits sitting EOB and standing.  Pt with good attempts to self-correct after initial cueing.    Extremity Assessment (includes Sensation/Coordination)  Upper Extremity Assessment: Generalized weakness, LUE deficits/detail LUE Deficits / Details: Decreased speed of coordination, weaker as compared to R. Although R dominant, appears to be decreased as compared to baseline (4/5) LUE Coordination: decreased fine motor  Lower Extremity Assessment: Defer to PT evaluation LLE Deficits / Details: decreased coordination with toe taps    ADLs  Overall ADL's : Needs assistance/impaired Eating/Feeding: Set up, Bed level Grooming: Moderate assistance, Standing Grooming Details (indicate cue type and  reason): Up to mod A for standing balance with RW. Would anticipate need for mod A for balance during bil grooming tasks. Upper Body Bathing: Min guard, Sitting Lower Body Bathing: Sit to/from stand, Minimal assistance Upper Body Dressing : Minimal assistance, Sitting Upper Body Dressing Details (indicate cue type and reason): balance Lower Body Dressing: Minimal assistance, Sitting/lateral leans Lower Body Dressing Details (indicate cue type and reason): min A to maintain figure 4 to don socks. Noting decreased dexterity of L hand Toilet Transfer: Moderate assistance, Minimal assistance, Stand-pivot, Rolling walker (2 wheels), BSC/3in1 Toilet Transfer Details (indicate cue type and reason): Simulated in room Functional mobility during ADLs: Minimal assistance, Moderate assistance, Rolling walker (2 wheels) General ADL Comments: Mod for functional mobility due to L lateral lean. Pt limited by balance, strength, cognition, and decreased midline orientation.    Mobility  Overal bed mobility: Needs Assistance Bed Mobility: Supine to Sit Supine to sit: HOB elevated, Min assist Sit to supine: Min guard General bed mobility comments: min A for trunk elevation.    Transfers  Overall transfer level: Needs assistance Equipment used: Rolling walker (2 wheels) Transfers: Sit to/from Stand Sit to Stand: Min assist General transfer comment: Assist for power up    Ambulation / Gait / Stairs / Wheelchair Mobility  Ambulation/Gait Ambulation/Gait assistance: Mod assist, +2 physical assistance, +2 safety/equipment Gait Distance (Feet): 20 Feet (20x1 15x1) Assistive device: Rolling walker (2 wheels), 2 person hand held assist Gait Pattern/deviations: Step-to pattern, Step-through pattern, Decreased stride length, Decreased dorsiflexion - left, Drifts right/left, Wide base of support, Ataxic, Decreased step length - right, Decreased step length - left, Decreased weight shift to right, Knees buckling,  Trunk flexed General Gait Details: attempted gait with +2 HHA. Physical assist required at hips to assist with weight shift to the R. Cues for weight shift. Pt very unsteady with heavy lean to the L with ambulation. attempted additional ambulation with RW. Pt did poorly with RW as he kept his knees and trunk flexed with RW. Switched to +2HHA for remainder of session. Gait velocity: slowed Gait velocity interpretation: <1.31 ft/sec, indicative of household ambulator Stairs: Yes Stairs assistance: Mod assist Stair Management: Two rails, Step to pattern, Forwards Number of Stairs: 2 General stair comments: using rails and difficulty with stepping ataxic quality and high risk for falls unaided    Posture / Balance Dynamic Sitting Balance Sitting balance - Comments: Requiring assist to correct R lean. Pt aware he is leaning and was able to hold himself upright with use of bedrail. Balance Overall balance assessment: Needs assistance, History of Falls Sitting-balance support: Feet supported Sitting balance-Leahy Scale: Poor Sitting balance - Comments: Requiring assist to correct R lean. Pt aware he is leaning and was able to hold himself upright with use of bedrail. Postural control: Right lateral lean Standing balance support: Bilateral upper extremity supported, During functional activity Standing balance-Leahy Scale: Poor Standing balance comment: Up to mod A.  Special needs/care consideration Skin intact   Previous Home Environment (from acute therapy documentation) Living Arrangements: Children Available Help at Discharge: Family, Available 24 hours/day Type of Home: House Home Layout: One level Home Access: Stairs to enter Entrance Stairs-Rails: Right, Left Entrance Stairs-Number of Steps: 3 Bathroom Shower/Tub: Multimedia programmer: Handicapped height Bathroom Accessibility: Yes How Accessible: Accessible via wheelchair, Accessible via walker [place]:  No Additional Comments: lives with daughter who works only one day a week and 76 y/o adopted daughter  Discharge Living Setting Plans for Discharge Living Setting: [name] home Type of Home at Discharge: House Discharge Home Layout: One level Discharge Home Access: Stairs to enter Entrance Stairs-Rails: Right, Left Entrance Stairs-Number of Steps: 3 Discharge Bathroom Shower/Tub: Walk-in shower Discharge Bathroom Toilet: Handicapped height Discharge Bathroom Accessibility: Yes How Accessible: Accessible via walker Does the patient have any problems obtaining your medications?: No  Social/Family/Support Systems Patient Roles: Other (Comment) Contact Information: [phone] Anticipated Caregiver: Levada Dy (daughter) Anticipated Caregiver's Contact Information: 24/7 Ability/Limitations of Caregiver: min A Discharge Plan Discussed with Primary Caregiver: Yes Is Caregiver In Agreement with Plan?: Yes Does Caregiver/Family have Issues with Lodging/Transportation while Pt is in Rehab?: No  Goals Patient/Family Goal for Rehab: PT/OT/SLP Supervision Expected length of stay: 8-10 days Pt/Family Agrees to Admission and willing to participate: Yes Program Orientation Provided & Reviewed with Pt/Caregiver Including Roles  & Responsibilities: No  Decrease burden of Care through IP rehab admission: none  Possible need for SNF placement upon discharge: not anticipated    Patient Condition: I have reviewed medical records from [facility], spoken with CM, and patient. I met with patient at the bedside for inpatient rehabilitation assessment.  Patient will benefit from ongoing PT, OT, and SLP, can actively participate in 3 hours of therapy a day 5 days of the week, and can make measurable gains during the admission.  Patient will also benefit from the coordinated team approach during an Inpatient Acute Rehabilitation admission.  The patient will receive intensive therapy as well  as Rehabilitation physician, nursing, social worker, and care management interventions.  Due to safety, skin/wound care, disease management, and medication administration the patient requires 24 hour a day rehabilitation nursing.  The patient is currently mod+2 with mobility and basic ADLs.  Discharge setting and therapy post discharge at home with home health is anticipated.  Patient has agreed to participate in the Acute Inpatient Rehabilitation Program and will admit today.  Preadmission Screen Completed By:  Genella Mech, 07/22/2022 2:17 PM ______________________________________________________________________   Discussed status with Dr. Dagoberto Ligas on 07/24/22 at 39 and received approval for admission today.  Admission Coordinator:  Genella Mech, CCC-SLP, time 1000/Date 07/24/22   Assessment/Plan: Diagnosis: Does the need for close, 24 hr/day Medical supervision in concert with the patient's rehab needs make it unreasonable for this patient to be served in a less intensive setting? 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 Yes Does the patient require coordinated care of a physician, rehab nurse, PT, OT, and SLP to address physical and functional deficits in the context of the above medical diagnosis(es)? Yes Addressing deficits in the following areas: balance, endurance, locomotion, strength, transferring, bowel/bladder control, bathing, dressing, feeding, grooming, toileting, cognition, and speech Can the patient actively participate in an intensive therapy program of at  least 3 hrs of therapy 5 days a week? Yes The potential for patient to make measurable gains while on inpatient rehab is  good Anticipated functional outcomes upon discharge from inpatient rehab: supervision PT, supervision OT, supervision SLP Estimated rehab length of stay to reach the above functional goals is: 8-10 days at w/c level? Anticipated discharge destination: Home 10. Overall Rehab/Functional Prognosis: good   MD Signature:

## 2022-07-23 ENCOUNTER — Inpatient Hospital Stay (HOSPITAL_COMMUNITY): Payer: Medicare HMO

## 2022-07-23 DIAGNOSIS — I10 Essential (primary) hypertension: Secondary | ICD-10-CM

## 2022-07-23 DIAGNOSIS — E7800 Pure hypercholesterolemia, unspecified: Secondary | ICD-10-CM

## 2022-07-23 DIAGNOSIS — I63311 Cerebral infarction due to thrombosis of right middle cerebral artery: Secondary | ICD-10-CM | POA: Diagnosis not present

## 2022-07-23 LAB — BASIC METABOLIC PANEL
Anion gap: 7 (ref 5–15)
BUN: 24 mg/dL — ABNORMAL HIGH (ref 8–23)
CO2: 26 mmol/L (ref 22–32)
Calcium: 9.4 mg/dL (ref 8.9–10.3)
Chloride: 104 mmol/L (ref 98–111)
Creatinine, Ser: 1.12 mg/dL (ref 0.61–1.24)
GFR, Estimated: >60 mL/min (ref >60)
Glucose, Bld: 121 mg/dL — ABNORMAL HIGH (ref 70–99)
Potassium: 4.5 mmol/L (ref 3.5–5.1)
Sodium: 137 mmol/L (ref 135–145)

## 2022-07-23 LAB — CBC
HCT: 40.3 % (ref 39.0–52.0)
Hemoglobin: 13.4 g/dL (ref 13.0–17.0)
MCH: 28.6 pg (ref 26.0–34.0)
MCHC: 33.3 g/dL (ref 30.0–36.0)
MCV: 85.9 fL (ref 80.0–100.0)
Platelets: 235 10*3/uL (ref 150–400)
RBC: 4.69 MIL/uL (ref 4.22–5.81)
RDW: 13.2 % (ref 11.5–15.5)
WBC: 9 10*3/uL (ref 4.0–10.5)
nRBC: 0 % (ref 0.0–0.2)

## 2022-07-23 NOTE — Evaluation (Addendum)
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 male presenting with acute dizziness and ataxia, found to have small right acute infarct in centrum semiovale; chronic infarct right thalamus.  Repeat MRI 9/4 reveals extension into the posterior aspect of the right lentiform nucleus which has substantially enlarged from the prior MRI (now 3 cm in AP dimension.) PMH positive for HTN and hernia repair.   Assessment / Plan / Recommendation Clinical Impression  Daniel [name] presents with dysarthria of speech (there may be some underlying speech differences at baseline), deficits in higher level reasoning and awareness, mild deficits in short-term recall.  He was interactive, very motivated to participate in rehab.    He passed Yale swallow screen, and despite CN deficits XII and VII on left, showed no difficulty eating a regular tray or drinking thin liquids at dinner time while he was participating in speech/language evaluation. Recommend ongoing SLP for speech/cognition and f/u at AIR level of care.    SLP Assessment  SLP Recommendation/Assessment: Patient needs continued Speech Lanaguage Pathology Services SLP Visit Diagnosis: Cognitive communication  deficit (R41.841);Dysarthria and anarthria (R47.1)    Recommendations for follow up therapy are one component of a multi-disciplinary discharge planning process, led by the attending physician.  Recommendations may be updated based on patient status, additional functional criteria and insurance authorization.    Follow Up Recommendations  Acute inpatient rehab (3hours/day)    Assistance Recommended at Discharge  Frequent or constant Supervision/Assistance  Functional Status Assessment Patient has had a recent decline in their functional status and demonstrates the ability to make significant improvements in function in a reasonable and predictable amount of time.  Frequency and Duration min 2x/week  2 weeks      SLP Evaluation Cognition  Overall Cognitive Status: Impaired/Different from baseline Arousal/Alertness: Awake/alert Orientation Level: Oriented X4 Attention: Sustained Sustained Attention: Appears intact Memory: Impaired Memory Impairment: Retrieval deficit;Decreased short term memory Awareness: Impaired Awareness Impairment: Emergent impairment Executive Function: Reasoning Reasoning: Impaired Reasoning Impairment: Verbal complex       Comprehension  Auditory Comprehension Overall Auditory Comprehension: Appears within functional limits for tasks assessed    Expression Expression Primary Mode of Expression: Verbal Verbal Expression Overall Verbal Expression: Appears within functional limits for tasks assessed Written Expression Dominant Hand: Right   Oral / Motor  Motor Speech Overall Motor Speech: Appears within functional limits for tasks assessed Articulation: Impaired Level of Impairment: Sentence Intelligibility: Intelligibility reduced Word: 75-100% accurate Phrase: 75-100% accurate Sentence: 75-100% accurate Motor Planning: Witnin functional limits            [name] 07/23/2022, 5:28 PM Anju Sereno L. Tivis Ringer, MA CCC/SLP Clinical  Specialist - [place] Office number [phone]

## 2022-07-23 NOTE — Progress Notes (Signed)
Occupational Therapy Treatment Patient Details Name: Daniel [name] MRN: [id number] DOB: 07/03/1946 Today's Date: 07/23/2022   History of present illness 76 y.o. 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  PMH positive for HTN and hernia repair.   OT comments  Pt with decreased use of LUE and with decreased balance as compared to previous OT session. RN notified and reporting this was noticed yesterday; MRI scheduled for today. Pt pleasant and highly motivated to participate with OT/PT today. Pt performing functional mobility with max A +2 with L knee block of both hyperextension and bucking, and with heavy R lateral lean. Pt performing toileting and LB ADL with max A this session. Noting LUE predominantly flaccid this session. Pt requiring up to max cues for safety and with decreased awareness of how change in status affects mobility as well as ability to perform ADL. Daughter present. Continue to recommend AIR to optimize safety and independence in ADL and IADL.    Recommendations for follow up therapy are one component of a multi-disciplinary discharge planning process, led by the attending physician.  Recommendations may be updated based on patient status, additional functional criteria and insurance authorization.    Follow Up Recommendations  Acute inpatient rehab (3hours/day)    Assistance Recommended at Discharge Frequent or constant Supervision/Assistance  Patient can return home with the following  Assistance with cooking/housework;Direct supervision/assist for medications management;Direct supervision/assist for financial management;Assist for transportation;Help with stairs or ramp for entrance;A lot of help with bathing/dressing/bathroom;Two people to help with walking and/or transfers   Equipment Recommendations  Other (comment) (defer)    Recommendations for Other Services Rehab consult    Precautions /  Restrictions Precautions Precautions: Fall Restrictions Weight Bearing Restrictions: No       Mobility Bed Mobility Overal bed mobility: Needs Assistance Bed Mobility: Supine to Sit     Supine to sit: Min assist, Mod assist     General bed mobility comments: MinA for trunk elevation but immediately requiring at least modA to maintain sitting balance due to falling to R side    Transfers Overall transfer level: Needs assistance Equipment used: 2 person hand held assist Transfers: Sit to/from Stand, Bed to chair/wheelchair/BSC Sit to Stand: Min assist, +2 physical assistance, +2 safety/equipment     Step pivot transfers: Mod assist, +2 safety/equipment, +2 physical assistance     General transfer comment: assist to power up and steady. Able to take steps towards BSC on L side but alternating L knee buckling and hyperextension     Balance Overall balance assessment: Needs assistance, History of Falls Sitting-balance support: Feet supported Sitting balance-Leahy Scale: Poor Sitting balance - Comments: initially with R lateral lean and LOB upon sitting requiring mod-maxA to correct. Patient with no concern over sitting balance and laughing Postural control: Right lateral lean Standing balance support: Bilateral upper extremity supported, During functional activity Standing balance-Leahy Scale: Zero Standing balance comment: maxA+2 to maintain standing balance during ambulation and static standing                           ADL either performed or assessed with clinical judgement   ADL Overall ADL's : Needs assistance/impaired                     Lower Body Dressing: Maximal assistance;+2 for physical assistance;+2 for safety/equipment Lower Body Dressing Details (indicate cue type and reason): Pt  requiring max A to don underwear sitting at bedside commode this session. Using RUE to pick up LLE to step into underwear. Cues for sequencing and problem solving  throughout. Toilet Transfer: Maximal assistance;+2 for physical assistance;+2 for safety/equipment;Stand-pivot;BSC/3in1 Toilet Transfer Details (indicate cue type and reason): Pt transferring to [facility] in room with max A +2. LLE blocking of buckling and of hyperextension at knee. Toileting- Clothing Manipulation and Hygiene: Maximal assistance;+2 for physical assistance;+2 for safety/equipment;Sit to/from stand Toileting - Clothing Manipulation Details (indicate cue type and reason): Max A for standing balance.     Functional mobility during ADLs: Maximal assistance;+2 for physical assistance;+2 for safety/equipment General ADL Comments: Max A for functional mobility from [facility] around EOB and to chair. [place] of LLE hyperextension and bucling. Pt with heavy R lateral lean    Extremity/Trunk Assessment Upper Extremity Assessment Upper Extremity Assessment: Generalized weakness;LUE deficits/detail LUE Deficits / Details: Pt with flaccid LUE this date, no active shoulder shrug, shoulder AROM, or wiggling fingers. Pt scheduled for MRI again today. LUE Sensation: decreased light touch LUE Coordination: decreased fine motor   Lower Extremity Assessment Lower Extremity Assessment: Defer to PT evaluation        Vision   Vision Assessment?: No apparent visual deficits Additional Comments: No apparent change in vision, continue to assess   Perception     Praxis      Cognition Arousal/Alertness: Awake/alert Behavior During Therapy: WFL for tasks assessed/performed Overall Cognitive Status: Impaired/Different from baseline Area of Impairment: Attention, Following commands, Safety/judgement, Problem solving, Awareness                   Current Attention Level: Selective   Following Commands: Follows one step commands with increased time, Follows one step commands consistently, Follows multi-step commands inconsistently Safety/Judgement: Decreased awareness of deficits Awareness:  Emergent, Anticipatory Problem Solving: Slow processing, Difficulty sequencing General Comments: Demos decreased safety awareness and insight into deficits. Patient acknowledges increased L sided weakness from previous sessions but does not associate with decreased mobility and increased assistance required        Exercises      Shoulder Instructions       General Comments      Pertinent Vitals/ Pain       Pain Assessment Pain Assessment: No/denies pain  Home Living                                          Prior Functioning/Environment              Frequency  Min 2X/week        Progress Toward Goals  OT Goals(current goals can now be found in the care plan section)  Progress towards OT goals: Not progressing toward goals - comment (Pt with decreased active ROM in RLE and RUE as compared to last OT session. RN notified and reporting he has MRI scheduled for today.)  Acute Rehab OT Goals Patient Stated Goal: Go to rehab to get back home. OT Goal Formulation: With patient Time For Goal Achievement: 08/03/22 Potential to Achieve Goals: Good ADL Goals Pt Will Perform Grooming: standing;with min guard assist Pt Will Perform Upper Body Dressing: with modified independence;sitting Pt Will Perform Lower Body Dressing: with modified independence;sit to/from stand Pt Will Transfer to Toilet: with min guard assist;ambulating;regular height toilet Additional ADL Goal #1: Pt will perform 3 step ADL with 1-2 verbal cues  Plan Discharge plan remains appropriate    Co-evaluation    PT/OT/SLP Co-Evaluation/Treatment: Yes Reason for Co-Treatment: For patient/therapist safety;To address functional/ADL transfers PT goals addressed during session: Mobility/safety with mobility        AM-PAC OT "6 Clicks" Daily Activity     Outcome Measure   Help from another person eating meals?: A Little Help from another person taking care of personal grooming?: A  Lot Help from another person toileting, which includes using toliet, bedpan, or urinal?: A Lot Help from another person bathing (including washing, rinsing, drying)?: A Lot Help from another person to put on and taking off regular upper body clothing?: A Lot Help from another person to put on and taking off regular lower body clothing?: A Lot 6 Click Score: 13    End of Session Equipment Utilized During Treatment: Gait belt  OT Visit Diagnosis: Unsteadiness on feet (R26.81);Other abnormalities of gait and mobility (R26.89);Muscle weakness (generalized) (M62.81);Other symptoms and signs involving cognitive function   Activity Tolerance Patient tolerated treatment well   Patient Left in chair;with call bell/phone within reach;with chair alarm set;with family/visitor present   Nurse Communication Mobility status;Other (comment) (Pt change in status; RN reporting MRI scheduled for today)        Time: 1749-4496 OT Time Calculation (min): 33 min  Charges: OT General Charges $OT Visit: 1 Visit OT Treatments $Self Care/Home Management : 8-22 mins  Shanda Howells, OTR/L [facility] Acute Rehabilitation Office: [phone]   Lula Olszewski 07/23/2022, 12:12 PM

## 2022-07-23 NOTE — Progress Notes (Addendum)
STROKE TEAM PROGRESS NOTE   INTERVAL HISTORY Patient evaluated at bedside this AM, is seen resting comfortably. Is accompanied by daughter, who is POA. Daughter explains patient's sxs began on 9/4 last week, dizziness that progressed to left sided weakness and abnormal gate. They presented to patient's PCP where they found elevated BP and recommended patient go to ED. Patient is willing to follow through PT/OT, appears motivated.   Vitals:   07/23/22 0354 07/23/22 0354 07/23/22 0728 07/23/22 1139  BP:  (!) 156/95 (!) 163/85 (!) 180/84  Pulse: 72 73 68 (!) 52  Resp:  '18 18 16  '$ Temp: 98.3 F (36.8 C) 98.3 F (36.8 C) 97.7 F (36.5 C) 98.7 F (37.1 C)  TempSrc: Oral Oral Oral Oral  SpO2: 97% 97% 98% 98%  Height:       CBC:  Recent Labs  Lab 07/19/22 1430 07/19/22 1441 07/22/22 0416 07/23/22 0352  WBC 7.2  --  9.5 9.0  NEUTROABS 4.8  --   --   --   HGB 12.4*   < > 13.3 13.4  HCT 38.6*   < > 39.2 40.3  MCV 88.5  --  85.8 85.9  PLT 228  --  224 235   < > = values in this interval not displayed.   Basic Metabolic Panel:  Recent Labs  Lab 07/19/22 1430 07/19/22 1441 07/23/22 0352  NA 137 137 137  K 4.1 4.0 4.5  CL 104 102 104  CO2 26  --  26  GLUCOSE 103* 98 121*  BUN 19 19 24*  CREATININE 1.01 0.90 1.12  CALCIUM 9.0  --  9.4   Lipid Panel:  Recent Labs  Lab 07/19/22 1430  CHOL 166  TRIG 70  HDL 65  CHOLHDL 2.6  VLDL 14  LDLCALC 87   HgbA1c:  Recent Labs  Lab 07/19/22 1430  HGBA1C 6.1*   Urine Drug Screen:  Recent Labs  Lab 07/19/22 1359  LABOPIA NONE DETECTED  COCAINSCRNUR NONE DETECTED  LABBENZ NONE DETECTED  AMPHETMU NONE DETECTED  THCU NONE DETECTED  LABBARB NONE DETECTED    Alcohol Level  Recent Labs  Lab 07/19/22 1430  ETH <10    IMAGING past 24 hours CT Head Wo Contrast  Result Date: 07/22/2022 CLINICAL DATA:  Follow-up examination for stroke. EXAM: CT HEAD WITHOUT CONTRAST TECHNIQUE: Contiguous axial images were obtained from the  base of the skull through the vertex without intravenous contrast. RADIATION DOSE REDUCTION: This exam was performed according to the departmental dose-optimization program which includes automated exposure control, adjustment of the mA and/or kV according to patient size and/or use of iterative reconstruction technique. COMPARISON:  Prior MRI from 07/19/2022. FINDINGS: Brain: Age-related cerebral atrophy with chronic small vessel ischemic disease. Remote lacunar infarct noted at the right thalamus. Continued normal interval evolutionary changes noted about the previously identified infarct involving the right frontal corona radiata/centrum semi ovale, relatively similar in size as compared to prior MRI. No regional mass effect or associated hemorrhage. No other visible acute large vessel territory infarct. No intracranial hemorrhage. No mass lesion or midline shift. No hydrocephalus or extra-axial fluid collection. Vascular: No hyperdense vessel. Calcified atherosclerosis present at skull base. Skull: Scalp soft tissues and calvarium demonstrate no acute finding. Sinuses/Orbits: Globes normal soft tissues within normal limits. Scattered mucoperiosteal thickening present about the sphenoethmoidal and maxillary sinuses. Mastoid air cells remain clear. Other: None. IMPRESSION: 1. Continued normal interval evolutionary changes about the previously identified infarct involving the right frontal corona radiata/centrum  semi ovale, relatively similar in size as compared to prior MRI. No regional mass effect or associated hemorrhage. 2. No other new acute intracranial abnormality. 3. Age-related cerebral atrophy with chronic small vessel ischemic disease. Electronically Signed   By: Jeannine Boga M.D.   On: 07/22/2022 23:17    PHYSICAL EXAM Neuro:  Mental Status: The patient is awake, alert, and oriented to person, place, month, year, and situation. He is capable of providing a clear and coherent history. No  signs of aphasia or neglect are observed.  Cranial Nerves: II: Pupils are equal, round, and reactive to light. III, IV, VI: EOMI without ptosis or diplopia. V: Facial sensation is symmetric to light touch, and facial movement appears symmetric both at rest and during smiling. VIII: Hearing is preserved to voice. X: Palate elevation is symmetric. XI: Asymmetric shoulder shrug is noted. Left-sided shoulder elevation is absent. XII: Tongue protrudes midline without evidence of atrophy or fasciculations.  Motor: L UE: 1/5 strength; minimal strength observed in the left upper extremity. L hand grip: Weak grip strength observed. L LE: 1/5 strength; minimal strength observed in the left lower extremity. R UE and R LE: Full strength, graded at 5/5. Sensory: Sensation to light touch remains symmetric in both the arms and legs.  Cerebellar: FNF test intact on right. Unable to assess FNF test on the left side due to weakness.  ASSESSMENT/PLAN Daniel [name] is a 76 y.o. male with history of HTN presenting with left sided weakness, ataxia, lightheadedness, and presyncope and was found to have a small acute infarct in right centrum ovale  Stroke: Right CR Infarct with progressive left UE &LE weakness. Etiology:  most liekly small vessel disease  Code Stroke CT head No acute abnormality. Small vessel disease. ASPECTS 10.   MRI  small acute infarct in right centrum semiovale MRI repeat 9/4 Some extension of previous stroke noted. No other acute findings.  Carotid Doppler: Unremarkable.  MRA: no LVO 2D Echo: EF 55 to 60% LDL 87 HgbA1c 6.1 VTE prophylaxis - Lovenox No antithrombotic prior to admission, now on aspirin 81 mg daily and clopidogrel 75 mg daily, for 3 weeks followed by aspirin alone. Therapy recommendations:  Acute IP rehab (3 hours/day) Disposition:  pending  Hypertension Home meds:  Losartan 50 mg Permissive hypertension (OK if < 220/120) but gradually normalize in 2-3  days Long-term BP goal normotensive  Hyperlipidemia Home meds:  none LDL 87, goal < 70 Continue Rosuvastatin 20 mg daily  Continue statin at discharge  Other Stroke Risk Factors Advanced Age >/= 77  Former smoker  Hospital day # [place], MD PGY-1  ATTENDING NOTE: I reviewed above note and agree with the assessment and plan. Pt was seen and examined.   76 year old male with history of hypertension admitted for acute onset left leg weakness and dizziness.  CT showed chronic right thalamic infarct.  MRI showed small right CR infarcts.  MRA no LVO, carotid Doppler negative.  EF 55 to 60%.  LDL 87 and A1c 6.1.  He was put on aspirin and Plavix DAPT and Crestor for stroke prevention.  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  Still has left facial droop and slurred speech.  Repeat MRI showed extension of previous infarct and right CR.  No hypotension episode noted since admission.  Explained to patient and daughter as well as primary team that some stroke patient presented with progressive  worsening symptoms due to decompensated penumbra versus capsular warning syndrome.  No specific treatment indicated.  Continue DAPT for 3 weeks and then ASA alone. Continue statin.  Aggressive risk factor modification.  Agree with CIR placement for aggressive PT/OT.  For detailed assessment and plan, please refer to above/below as I have made changes wherever appropriate.   Neurology will sign off. Please call with questions. Pt will follow up with stroke clinic NP at [facility] in about 4 weeks. Thanks for the consult.   Rosalin Hawking, MD PhD Stroke Neurology 07/23/2022 3:54 PM   To contact Stroke Continuity provider, please refer to [email or web address] After hours, contact General Neurology

## 2022-07-23 NOTE — Progress Notes (Signed)
Inpatient Rehab Admissions Coordinator:   I received authorization for CIR today from Pt.'s insurance. Per Stroke MD, Pt. To have repeat MRI today due to worsening weakness. I will follow up for potential admit pending medical readiness and bed availability.   Clemens Catholic, [place], [place] Admissions Coordinator  [phone] ([place]) [phone] (office)

## 2022-07-23 NOTE — Plan of Care (Signed)
  Problem: Education: Goal: Knowledge of General Education information will improve Description: Including pain rating scale, medication(s)/side effects and non-pharmacologic comfort measures Outcome: Progressing   Problem: Health Behavior/Discharge Planning: Goal: Ability to manage health-related needs will improve Outcome: Progressing   Problem: Clinical Measurements: Goal: Ability to maintain clinical measurements within normal limits will improve Outcome: Progressing Goal: Will remain free from infection Outcome: Progressing Goal: Diagnostic test results will improve Outcome: Progressing Goal: Respiratory complications will improve Outcome: Progressing Goal: Cardiovascular complication will be avoided Outcome: Progressing   Problem: Nutrition: Goal: Adequate nutrition will be maintained Outcome: Progressing   Problem: Coping: Goal: Level of anxiety will decrease Outcome: Progressing   Problem: Elimination: Goal: Will not experience complications related to bowel motility Outcome: Progressing Goal: Will not experience complications related to urinary retention Outcome: Progressing   Problem: Pain Managment: Goal: General experience of comfort will improve Outcome: Progressing   Problem: Safety: Goal: Ability to remain free from injury will improve Outcome: Progressing   Problem: Skin Integrity: Goal: Risk for impaired skin integrity will decrease Outcome: Progressing   Problem: Education: Goal: Knowledge of disease or condition will improve Outcome: Progressing Goal: Knowledge of secondary prevention will improve (SELECT ALL) Outcome: Progressing Goal: Knowledge of patient specific risk factors will improve (INDIVIDUALIZE FOR PATIENT) Outcome: Progressing Goal: Individualized Educational Video(s) Outcome: Progressing   Problem: Health Behavior/Discharge Planning: Goal: Ability to manage health-related needs will improve Outcome: Progressing   Problem:  Self-Care: Goal: Ability to participate in self-care as condition permits will improve Outcome: Progressing   Problem: Ischemic Stroke/TIA Tissue Perfusion: Goal: Complications of ischemic stroke/TIA will be minimized Outcome: Progressing

## 2022-07-23 NOTE — Progress Notes (Signed)
Physical Therapy Treatment Patient Details Name: Daniel [name] MRN: [id number] DOB: 1946-11-07 Today's Date: 07/23/2022   History of Present Illness 76 y.o. 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  PMH positive for HTN and hernia repair.    PT Comments    Patient with increased L sided weakness compared to previous sessions. Requiring increased assistance for ambulation with HHAx2. Unable to grip RW with L UE to utilize. Also, demonstrating L knee hyperextension as well as buckling during gait requiring maxA+2 to maintain balance and assist to limit knee hyperextension. Patient able to acknowledge L sided weakness but unable to associate with current performance. RN notified of new L sided weakness and MD already aware. MRI ordered per RN. Continue to recommend acute inpatient rehab (AIR) for post-acute therapy needs.     Recommendations for follow up therapy are one component of a multi-disciplinary discharge planning process, led by the attending physician.  Recommendations may be updated based on patient status, additional functional criteria and insurance authorization.  Follow Up Recommendations  Acute inpatient rehab (3hours/day)     Assistance Recommended at Discharge Frequent or constant Supervision/Assistance  Patient can return home with the following A lot of help with walking and/or transfers;A lot of help with bathing/dressing/bathroom;Direct supervision/assist for medications management;Help with stairs or ramp for entrance;Assist for transportation;Assistance with cooking/housework   Equipment Recommendations  Rolling Neng Albee (2 wheels)    Recommendations for Other Services       Precautions / Restrictions Precautions Precautions: Fall Restrictions Weight Bearing Restrictions: No     Mobility  Bed Mobility Overal bed mobility: Needs Assistance Bed Mobility: Supine to Sit     Supine to sit:  Min assist, Mod assist     General bed mobility comments: MinA for trunk elevation but immediately requiring at least modA to maintain sitting balance due to falling to R side    Transfers Overall transfer level: Needs assistance Equipment used: 2 person hand held assist Transfers: Sit to/from Stand, Bed to chair/wheelchair/BSC Sit to Stand: Min assist, +2 physical assistance, +2 safety/equipment   Step pivot transfers: Mod assist, +2 safety/equipment, +2 physical assistance       General transfer comment: assist to power up and steady. Able to take steps towards BSC on L side but alternating L knee buckling and hyperextension    Ambulation/Gait Ambulation/Gait assistance: Max assist, +2 physical assistance, +2 safety/equipment Gait Distance (Feet): 15 Feet Assistive device: 2 person hand held assist Gait Pattern/deviations: Step-to pattern, Decreased step length - right, Decreased stride length, Knee hyperextension - left, Knees buckling, Ataxic Gait velocity: decreased     General Gait Details: unable to utilize RW due to L hand/UE weakness and inability to grip RW. Utilized HHAx2. Patient requiring maxA+2 for balance and tactile cues/assist at L knee to limit hyperextension. Ataxic throughout with little awareness of deficits and amount of assist required.   Stairs             Wheelchair Mobility    Modified Rankin (Stroke Patients Only) Modified Rankin (Stroke Patients Only) Pre-Morbid Rankin Score: No significant disability Modified Rankin: Moderately severe disability     Balance Overall balance assessment: Needs assistance, History of Falls Sitting-balance support: Feet supported Sitting balance-Leahy Scale: Poor Sitting balance - Comments: initially with R lateral lean and LOB upon sitting requiring mod-maxA to correct. Patient with no concern over sitting balance and laughing   Standing balance support: Bilateral upper extremity supported,  During  functional activity Standing balance-Leahy Scale: Zero Standing balance comment: maxA+2 to maintain standing balance during ambulation and static standing                            Cognition Arousal/Alertness: Awake/alert Behavior During Therapy: WFL for tasks assessed/performed Overall Cognitive Status: Impaired/Different from baseline Area of Impairment: Attention, Following commands, Safety/judgement, Problem solving, Awareness                   Current Attention Level: Selective   Following Commands: Follows one step commands with increased time, Follows one step commands consistently, Follows multi-step commands inconsistently Safety/Judgement: Decreased awareness of deficits Awareness: Emergent, Anticipatory Problem Solving: Slow processing, Difficulty sequencing General Comments: Demos decreased safety awareness and insight into deficits. Patient acknowledges increased L sided weakness from previous sessions but does not associate with decreased mobility and increased assistance required        Exercises      General Comments        Pertinent Vitals/Pain Pain Assessment Pain Assessment: No/denies pain    Home Living                          Prior Function            PT Goals (current goals can now be found in the care plan section) Acute Rehab PT Goals Patient Stated Goal: to get out of here PT Goal Formulation: With patient/family Time For Goal Achievement: 08/03/22 Potential to Achieve Goals: Good Progress towards PT goals: Not progressing toward goals - comment (seems to be regressing due to increased L sided weakness)    Frequency    Min 4X/week      PT Plan Current plan remains appropriate    Co-evaluation PT/OT/SLP Co-Evaluation/Treatment: Yes Reason for Co-Treatment: For patient/therapist safety;To address functional/ADL transfers PT goals addressed during session: Mobility/safety with mobility;Balance         AM-PAC PT "6 Clicks" Mobility   Outcome Measure  Help needed turning from your back to your side while in a flat bed without using bedrails?: A Little Help needed moving from lying on your back to sitting on the side of a flat bed without using bedrails?: A Little Help needed moving to and from a bed to a chair (including a wheelchair)?: Total Help needed standing up from a chair using your arms (e.g., wheelchair or bedside chair)?: Total Help needed to walk in hospital room?: Total Help needed climbing 3-5 steps with a railing? : Total 6 Click Score: 10    End of Session Equipment Utilized During Treatment: Gait belt Activity Tolerance: Patient tolerated treatment well Patient left: in chair;with call bell/phone within reach;with chair alarm set;with family/visitor present Nurse Communication: Mobility status PT Visit Diagnosis: Other abnormalities of gait and mobility (R26.89);History of falling (Z91.81);Ataxic gait (R26.0)     Time: 6948-5462 PT Time Calculation (min) (ACUTE ONLY): 33 min  Charges:  $Gait Training: 8-22 mins                     Shavonn Convey A. Gilford Rile PT, DPT Acute Rehabilitation Services Office [phone]    Linna Hoff 07/23/2022, 10:29 AM

## 2022-07-23 NOTE — Progress Notes (Signed)
     Daily Progress Note Intern Pager: [phone]  Patient name: Daniel [name] Medical record number: [id number] Date of birth: 1946/09/19 Age: 76 y.o. Gender: male  Primary Care Provider: Lind Covert, MD Consultants: Neurology  Code Status: DNR  Pt Overview and Major Events to Date:  8/31: Admitted to FMTS   Assessment and Plan:  76 y.o. 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  * CVA (cerebral vascular accident) ([place]) 9/3 new onset left upper extremity weakness.  Head CT 9/3 showed no acute brain abnormality.  MRI showed small acute infarct right centrum semiovale. Carotid U/S <40% stenosis bilateral. Risk stratifications so far notable for LDL 87, A1c 6.1%. Planning acute inpatient rehab for dispo.  -Neurology following, appreciate continued involvement and recs -Plavix + ASA for 3 weeks, followed by ASA only.  -Continuous cardiac monitoring for arrhythmia -Continue crestor daily  -Allow for permissive HTN, restart antihypertensive medication per neurology  -Neuro checks q4h  -Potential disposition CIR - MRI head ordered per neuro   Prediabetes Hgb A1c 6.1%. -Continue statin for secondary prevention given CVA -Encourage continued lifestyle modifications for improved preventative measures   Essential hypertension Home meds include losartan 50 mg.  -monitor BP -continue to hold losartan while in permissive hypertension window, restart per neuro recs -patient will need continued BP control outpatient to prevent future stroke    FEN/GI: heart healthy  PPx: lovenox  Dispo:CIR tomorrow. Barriers include further stroke workup.   Subjective:  Resting comfortably. No acute events overnight. New onset left sided LUE and LLE weakness starting yesterday afternoon.  Objective: Temp:  [97.7 F (36.5 C)-98.4 F  (36.9 C)] 97.7 F (36.5 C) (09/04 0728) Pulse Rate:  [62-88] 68 (09/04 0728) Resp:  [17-20] 18 (09/04 0728) BP: (130-163)/(74-95) 163/85 (09/04 0728) SpO2:  [95 %-98 %] 98 % (09/04 0728) Physical Exam: General: well appearing, NAD  Cardiovascular: regular rate, regular rhythm, no murmurs on exam  Respiratory: clear, no wheezing, no crackles.  Abdomen: non-tender, non-distended  Extremities: 3/5 LUE and 3-4/5 LLE, worse from the last exam.   Laboratory: Most recent CBC Lab Results  Component Value Date   WBC 9.0 07/23/2022   HGB 13.4 07/23/2022   HCT 40.3 07/23/2022   MCV 85.9 07/23/2022   PLT 235 07/23/2022   Most recent BMP    Latest Ref Rng & Units 07/23/2022    3:52 AM  BMP  Glucose 70 - 99 mg/dL 121   BUN 8 - 23 mg/dL 24   Creatinine 0.61 - 1.24 mg/dL 1.12   Sodium 135 - 145 mmol/L 137   Potassium 3.5 - 5.1 mmol/L 4.5   Chloride 98 - 111 mmol/L 104   CO2 22 - 32 mmol/L 26   Calcium 8.9 - 10.3 mg/dL 9.4     CT Head: 9/3 - Continue normal interval evolutionary changes about the previously identified infarct involving the right frontal corona. - No regional mass effect or associated hemorrhage - No other acute intracranial abnormality - Age-related cerebral atrophy with chronic small vessel ischemic disease   Darci Current, DO 07/23/2022, 8:16 AM  PGY-1, [place] Intern pager: [phone], text pages welcome Secure chat group [place]

## 2022-07-24 ENCOUNTER — Inpatient Hospital Stay (HOSPITAL_COMMUNITY): Payer: Medicare HMO

## 2022-07-24 ENCOUNTER — Other Ambulatory Visit: Payer: Self-pay

## 2022-07-24 ENCOUNTER — Encounter (HOSPITAL_COMMUNITY): Payer: Self-pay | Admitting: Physical Medicine & Rehabilitation

## 2022-07-24 ENCOUNTER — Inpatient Hospital Stay (HOSPITAL_COMMUNITY)
Admission: RE | Admit: 2022-07-24 | Discharge: 2022-08-15 | DRG: 057 | Disposition: A | Discharge status: Home Health | Payer: Medicare HMO | Source: Intra-hospital | Attending: Physical Medicine & Rehabilitation | Admitting: Physical Medicine & Rehabilitation

## 2022-07-24 DIAGNOSIS — Z79899 Other long term (current) drug therapy: Secondary | ICD-10-CM | POA: Diagnosis not present

## 2022-07-24 DIAGNOSIS — I6381 Other cerebral infarction due to occlusion or stenosis of small artery: Secondary | ICD-10-CM | POA: Diagnosis not present

## 2022-07-24 DIAGNOSIS — K5900 Constipation, unspecified: Secondary | ICD-10-CM | POA: Diagnosis not present

## 2022-07-24 DIAGNOSIS — I639 Cerebral infarction, unspecified: Secondary | ICD-10-CM | POA: Diagnosis present

## 2022-07-24 DIAGNOSIS — I69322 Dysarthria following cerebral infarction: Secondary | ICD-10-CM

## 2022-07-24 DIAGNOSIS — Z66 Do not resuscitate: Secondary | ICD-10-CM | POA: Diagnosis not present

## 2022-07-24 DIAGNOSIS — Z8249 Family history of ischemic heart disease and other diseases of the circulatory system: Secondary | ICD-10-CM

## 2022-07-24 DIAGNOSIS — E785 Hyperlipidemia, unspecified: Secondary | ICD-10-CM | POA: Diagnosis present

## 2022-07-24 DIAGNOSIS — R296 Repeated falls: Secondary | ICD-10-CM | POA: Diagnosis present

## 2022-07-24 DIAGNOSIS — I69354 Hemiplegia and hemiparesis following cerebral infarction affecting left non-dominant side: Secondary | ICD-10-CM | POA: Diagnosis not present

## 2022-07-24 DIAGNOSIS — I10 Essential (primary) hypertension: Secondary | ICD-10-CM | POA: Diagnosis not present

## 2022-07-24 DIAGNOSIS — Z20822 Contact with and (suspected) exposure to covid-19: Secondary | ICD-10-CM | POA: Diagnosis not present

## 2022-07-24 DIAGNOSIS — G4700 Insomnia, unspecified: Secondary | ICD-10-CM | POA: Diagnosis not present

## 2022-07-24 DIAGNOSIS — M189 Osteoarthritis of first carpometacarpal joint, unspecified: Secondary | ICD-10-CM | POA: Diagnosis not present

## 2022-07-24 DIAGNOSIS — Z83438 Family history of other disorder of lipoprotein metabolism and other lipidemia: Secondary | ICD-10-CM

## 2022-07-24 DIAGNOSIS — M25512 Pain in left shoulder: Secondary | ICD-10-CM | POA: Diagnosis not present

## 2022-07-24 DIAGNOSIS — R32 Unspecified urinary incontinence: Secondary | ICD-10-CM | POA: Diagnosis not present

## 2022-07-24 DIAGNOSIS — R29810 Facial weakness: Secondary | ICD-10-CM | POA: Diagnosis not present

## 2022-07-24 DIAGNOSIS — R7303 Prediabetes: Secondary | ICD-10-CM | POA: Diagnosis not present

## 2022-07-24 DIAGNOSIS — R1310 Dysphagia, unspecified: Secondary | ICD-10-CM | POA: Diagnosis not present

## 2022-07-24 DIAGNOSIS — R252 Cramp and spasm: Secondary | ICD-10-CM | POA: Diagnosis not present

## 2022-07-24 DIAGNOSIS — Z87891 Personal history of nicotine dependence: Secondary | ICD-10-CM | POA: Diagnosis not present

## 2022-07-24 DIAGNOSIS — R4781 Slurred speech: Secondary | ICD-10-CM | POA: Diagnosis not present

## 2022-07-24 DIAGNOSIS — R29701 NIHSS score 1: Secondary | ICD-10-CM | POA: Diagnosis not present

## 2022-07-24 DIAGNOSIS — E860 Dehydration: Secondary | ICD-10-CM | POA: Diagnosis not present

## 2022-07-24 DIAGNOSIS — M19032 Primary osteoarthritis, left wrist: Secondary | ICD-10-CM | POA: Diagnosis not present

## 2022-07-24 DIAGNOSIS — I69392 Facial weakness following cerebral infarction: Secondary | ICD-10-CM

## 2022-07-24 DIAGNOSIS — K5901 Slow transit constipation: Secondary | ICD-10-CM | POA: Diagnosis not present

## 2022-07-24 DIAGNOSIS — R0602 Shortness of breath: Secondary | ICD-10-CM | POA: Diagnosis not present

## 2022-07-24 DIAGNOSIS — Z8262 Family history of osteoporosis: Secondary | ICD-10-CM | POA: Diagnosis not present

## 2022-07-24 DIAGNOSIS — G8194 Hemiplegia, unspecified affecting left nondominant side: Secondary | ICD-10-CM | POA: Diagnosis not present

## 2022-07-24 DIAGNOSIS — I63311 Cerebral infarction due to thrombosis of right middle cerebral artery: Secondary | ICD-10-CM

## 2022-07-24 LAB — TROPONIN I (HIGH SENSITIVITY)
Troponin I (High Sensitivity): 6 ng/L (ref <18)
Troponin I (High Sensitivity): 5 ng/L (ref <18)

## 2022-07-24 MED ORDER — ASPIRIN 81 MG PO TBEC: 81.0000 mg | DELAYED_RELEASE_TABLET | Freq: Every day | ORAL | 12 refills | Status: DC

## 2022-07-24 MED ORDER — CLOPIDOGREL BISULFATE 75 MG PO TABS
75.0000 mg | ORAL_TABLET | Freq: Every day | ORAL | Status: AC
  Administered 2022-07-25 – 2022-08-14 (×21): 75 mg via ORAL
  Filled 2022-07-24 (×21): qty 1

## 2022-07-24 MED ORDER — ROSUVASTATIN CALCIUM 20 MG PO TABS
20.0000 mg | ORAL_TABLET | Freq: Every day | ORAL | Status: DC
Start: 2022-07-25 — End: 2022-08-15
  Administered 2022-07-25 – 2022-08-15 (×22): 20 mg via ORAL
  Filled 2022-07-24 (×22): qty 1

## 2022-07-24 MED ORDER — ENOXAPARIN SODIUM 40 MG/0.4ML IJ SOSY
40.0000 mg | PREFILLED_SYRINGE | INTRAMUSCULAR | Status: DC
  Administered 2022-07-25 – 2022-08-14 (×21): 40 mg via SUBCUTANEOUS
  Filled 2022-07-24 (×21): qty 0.4

## 2022-07-24 MED ORDER — LOSARTAN POTASSIUM 50 MG PO TABS
50.0000 mg | ORAL_TABLET | Freq: Every day | ORAL | Status: DC
Start: 2022-07-25 — End: 2022-07-28
  Administered 2022-07-25 – 2022-07-28 (×4): 50 mg via ORAL
  Filled 2022-07-24 (×4): qty 1

## 2022-07-24 MED ORDER — ACETAMINOPHEN 325 MG PO TABS
650.0000 mg | ORAL_TABLET | ORAL | Status: DC | PRN
  Administered 2022-07-25 – 2022-08-14 (×9): 650 mg via ORAL
  Filled 2022-07-24 (×11): qty 2

## 2022-07-24 MED ORDER — ACETAMINOPHEN 650 MG RE SUPP: 650.0000 mg | RECTAL | Status: DC | PRN

## 2022-07-24 MED ORDER — ENOXAPARIN SODIUM 40 MG/0.4ML IJ SOSY: 40.0000 mg | PREFILLED_SYRINGE | INTRAMUSCULAR | Status: DC

## 2022-07-24 MED ORDER — ROSUVASTATIN CALCIUM 20 MG PO TABS: 20.0000 mg | ORAL_TABLET | Freq: Every day | ORAL | 0 refills | Status: DC

## 2022-07-24 MED ORDER — CLOPIDOGREL BISULFATE 75 MG PO TABS: 75.0000 mg | ORAL_TABLET | Freq: Every day | ORAL | 0 refills | Status: DC

## 2022-07-24 MED ORDER — ASPIRIN 81 MG PO TBEC
81.0000 mg | DELAYED_RELEASE_TABLET | Freq: Every day | ORAL | Status: DC
  Administered 2022-07-25 – 2022-08-15 (×22): 81 mg via ORAL
  Filled 2022-07-24 (×22): qty 1

## 2022-07-24 MED ORDER — SENNOSIDES-DOCUSATE SODIUM 8.6-50 MG PO TABS: 1.0000 | ORAL_TABLET | Freq: Every evening | ORAL | Status: DC | PRN

## 2022-07-24 NOTE — Progress Notes (Signed)
Inpatient Rehabilitation Admission Medication Review by a Pharmacist  A complete drug regimen review was completed for this patient to identify any potential clinically significant medication issues.  High Risk Drug Classes Is patient taking? Indication by Medication  Antipsychotic No   Anticoagulant Yes Lovenox- VTE prophylaxis  Antibiotic No   Opioid No   Antiplatelet Yes ASA and Plavix x 3 weeks followed by ASA alone.  Hypoglycemics/insulin    Vasoactive Medication Yes Losartan-HTN  Chemotherapy No   Other Yes Rosuvastatin-HLD     Type of Medication Issue Identified Description of Issue Recommendation(s)  Drug Interaction(s) (clinically significant)     Duplicate Therapy     Allergy     No Medication Administration End Date     Incorrect Dose     Additional Drug Therapy Needed     Significant med changes from prior encounter (inform family/care partners about these prior to discharge).    Other       Clinically significant medication issues were identified that warrant physician communication and completion of prescribed/recommended actions by midnight of the next day:  No  Name of provider notified for urgent issues identified:   Provider Method of Notification:     Pharmacist comments:  DAPT with ASA 81 and Plavix '75mg'$  x3 weeks (end 08/10/22) followed by ASA alone per [facility] Medicine Teaching Service discharge summary on 07/24/22,  as recommended by Neurologist,  Dr. Erlinda Hong.    Time spent performing this drug regimen review (minutes):  [place], RPh Clinical Pharmacist  07/24/2022 4:50 PM

## 2022-07-24 NOTE — TOC Transition Note (Signed)
Transition of Care [facility]) - CM/SW Discharge Note   Patient Details  Name: Daniel [name] MRN: [id number] Date of Birth: [birth year]  [facility] CM/SW Contact:  Pollie Friar, RN Phone Number: 07/24/2022, 10:46 AM   Clinical Narrative:    Patient is discharging to CIR today. CM signing off.    Final next level of care: IP Rehab Facility Barriers to Discharge: No Barriers Identified   Patient Goals and CMS Choice     Choice offered to / list presented to : Patient  Discharge Placement                       Discharge Plan and Services                                     Social Determinants of Health (SDOH) Interventions     Readmission Risk Interventions     No data to display

## 2022-07-24 NOTE — H&P (Signed)
Physical Medicine and Rehabilitation Admission H&P        Chief Complaint  Patient presents with   Dizziness  : HPI: Daniel [name] is a 76 year old right-handed male with history of hypertension as well as former tobacco use.  Per chart review patient lives with his daughter.  1 level home 3 steps to entry.  Independent driving prior to admission.  Presented 07/19/2022 with left-sided weakness and dizziness of acute onset.  No nausea vomiting or fever reported.  Blood pressure 205/120.  CT/MRI showed small acute infarct right centrum semiovale.  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  MRA showed no large vessel occlusion.  Admission chemistry unremarkable except glucose 103, alcohol negative, urine drug screen negative.  Echocardiogram with ejection fraction of 55 to 60% no wall motion abnormalities grade 1 diastolic dysfunction.  Neurology follow-up placed on aspirin as well as Plavix for CVA prophylaxis.  Hospital course increasing left-sided weakness 07/23/2022 with MRI completed showing increased size of acute infarct involving the right corona radiata and basal ganglia.  MRI findings discussed with neurology no specific changes recommended and remains on low-dose aspirin with Plavix x3 weeks followed by aspirin alone.  Placed on Lovenox for DVT prophylaxis.  Monitoring of permissive hypertension patient on losartan 50 mg prior to admission.  Tolerating a regular consistency diet.  Patient with nonspecific chest pain while eating breakfast troponin negative EKG mild sinus bradycardia.  Chest x-ray NAD.  Therapy evaluations completed due to patient's left-sided weakness and decreased functional mobility was admitted for a comprehensive rehab program     Pt reports LUE/LLE very weak- can barely move.  Only has swallowing issue with dry foods- when doesn't drink enough.  Just ate lunch- no issues there.  LBM yesterday Peeing OK  with urinal.    Review of Systems  Constitutional:  Negative for chills and fever.  HENT:  Negative for hearing loss.   Eyes:  Negative for blurred vision and double vision.  Respiratory:  Negative for cough and shortness of breath.   Cardiovascular:  Negative for chest pain, palpitations and leg swelling.  Gastrointestinal:  Positive for constipation and heartburn. Negative for nausea and vomiting.  Genitourinary:  Negative for dysuria, flank pain and hematuria.  Musculoskeletal:  Positive for myalgias.  Skin:  Negative for rash.  Neurological:  Positive for dizziness and weakness.  All other systems reviewed and are negative.       Past Medical History:  Diagnosis Date   Hernia of fascia 2008    repair with many complications    Hypertension           Past Surgical History:  Procedure Laterality Date   APPENDECTOMY       HERNIA REPAIR             Family History  Problem Relation Age of Onset   Hypertension Mother     Osteoporosis Mother     Thyroid disease Mother     Alcohol abuse Father     Depression Sister     Hypertension Sister     High Cholesterol Sister     Hypertension Brother      Social History:  reports that he has quit smoking. He has never used smokeless tobacco. He reports that he does not drink alcohol and does not use drugs. Allergies: No Known Allergies       Medications Prior to Admission  Medication Sig Dispense Refill  losartan (COZAAR) 50 MG tablet Take 1 tablet (50 mg total) by mouth at bedtime. 90 tablet 3          Home: Home Living Family/patient expects to be discharged to:: Private residence Living Arrangements: Children Available Help at Discharge: Family, Available 24 hours/day Type of Home: House Home Access: Stairs to enter Technical brewer of Steps: 3 Entrance Stairs-Rails: Right, Left Home Layout: One level Bathroom Shower/Tub: Multimedia programmer: Handicapped height Bathroom Accessibility: Yes Home  Equipment: Radio producer - single point Additional Comments: lives with daughter who works only one day a week and 76 y/o adopted daughter  Lives With: Daughter   Functional History: Prior Function Prior Level of Function : Independent/Modified Independent, Driving 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, drives, 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, no injury and self recovery ADLs Comments: Pt reports independent in ADL and IADL   Functional Status:  Mobility: Bed Mobility Overal bed mobility: Needs Assistance Bed Mobility: Supine to Sit Supine to sit: Min assist, Mod assist Sit to supine: Min guard General bed mobility comments: MinA for trunk elevation but immediately requiring at least modA to maintain sitting balance due to falling to R side Transfers Overall transfer level: Needs assistance Equipment used: 2 person hand held assist Transfers: Sit to/from Stand, Bed to chair/wheelchair/BSC Sit to Stand: Min assist, +2 physical assistance, +2 safety/equipment Bed to/from chair/wheelchair/BSC transfer type:: Step pivot Step pivot transfers: Mod assist, +2 safety/equipment, +2 physical assistance General transfer comment: assist to power up and steady. Able to take steps towards BSC on L side but alternating L knee buckling and hyperextension Ambulation/Gait Ambulation/Gait assistance: Max assist, +2 physical assistance, +2 safety/equipment Gait Distance (Feet): 15 Feet Assistive device: 2 person hand held assist Gait Pattern/deviations: Step-to pattern, Decreased step length - right, Decreased stride length, Knee hyperextension - left, Knees buckling, Ataxic General Gait Details: unable to utilize RW due to L hand/UE weakness and inability to grip RW. Utilized HHAx2. Patient requiring maxA+2 for balance and tactile cues/assist at L knee to limit hyperextension. Ataxic throughout with little awareness of deficits and amount of assist required. Gait velocity:  decreased Gait velocity interpretation: <1.31 ft/sec, indicative of household ambulator Stairs: Yes Stairs assistance: Mod assist Stair Management: Two rails, Step to pattern, Forwards Number of Stairs: 2 General stair comments: using rails and difficulty with stepping ataxic quality and high risk for falls unaided   ADL: ADL Overall ADL's : Needs assistance/impaired Eating/Feeding: Set up, Bed level Grooming: Moderate assistance, Standing Grooming Details (indicate cue type and reason): Up to mod A for standing balance with RW. Would anticipate need for mod A for balance during bil grooming tasks. Upper Body Bathing: Min guard, Sitting Lower Body Bathing: Sit to/from stand, Minimal assistance Upper Body Dressing : Minimal assistance, Sitting Upper Body Dressing Details (indicate cue type and reason): balance Lower Body Dressing: Maximal assistance, +2 for physical assistance, +2 for safety/equipment Lower Body Dressing Details (indicate cue type and reason): Pt requiring max A to don underwear sitting at bedside commode this session. Using RUE to pick up LLE to step into underwear. Cues for sequencing and problem solving throughout. Toilet Transfer: Maximal assistance, +2 for physical assistance, +2 for safety/equipment, Stand-pivot, BSC/3in1 Toilet Transfer Details (indicate cue type and reason): Pt transferring to [facility] in room with max A +2. LLE blocking of buckling and of hyperextension at knee. Toileting- Clothing Manipulation and Hygiene: Maximal assistance, +2 for physical assistance, +2 for safety/equipment, Sit to/from stand  Toileting - Clothing Manipulation Details (indicate cue type and reason): Max A for standing balance. Functional mobility during ADLs: Maximal assistance, +2 for physical assistance, +2 for safety/equipment General ADL Comments: Max A for functional mobility from [facility] around EOB and to chair. [place] of LLE hyperextension and bucling. Pt with heavy R lateral  lean   Cognition: Cognition Overall Cognitive Status: Impaired/Different from baseline Arousal/Alertness: Awake/alert Orientation Level: Oriented X4 Attention: Sustained Sustained Attention: Appears intact Memory: Impaired Memory Impairment: Retrieval deficit, Decreased short term memory Awareness: Impaired Awareness Impairment: Emergent impairment Executive Function: Reasoning Reasoning: Impaired Reasoning Impairment: Verbal complex Cognition Arousal/Alertness: Awake/alert Behavior During Therapy: WFL for tasks assessed/performed Overall Cognitive Status: Impaired/Different from baseline Area of Impairment: Attention, Following commands, Safety/judgement, Problem solving, Awareness Current Attention Level: Selective Following Commands: Follows one step commands with increased time, Follows one step commands consistently, Follows multi-step commands inconsistently Safety/Judgement: Decreased awareness of deficits Awareness: Emergent, Anticipatory Problem Solving: Slow processing, Difficulty sequencing General Comments: Demos decreased safety awareness and insight into deficits. Patient acknowledges increased L sided weakness from previous sessions but does not associate with decreased mobility and increased assistance required   Physical Exam: Blood pressure (!) 152/80, pulse (!) 55, temperature 98.6 F (37 C), temperature source Oral, resp. rate 18, height '5\' 8"'$  (1.727 m), SpO2 97 %. Physical Exam Vitals and nursing note reviewed.  Constitutional:      Appearance: Normal appearance. He is normal weight.     Comments: Leaning on R side- awake, alert, making eye contact; appears stated age, NAD HOH esp in L ear  HENT:     Head: Normocephalic and atraumatic.     Comments: L facial droop L tongue deviation at rest Sensation on face equal/intact    Right Ear: External ear normal.     Left Ear: External ear normal.     Nose: Nose normal. No congestion.     Mouth/Throat:      Mouth: Mucous membranes are dry.     Pharynx: Oropharynx is clear. No oropharyngeal exudate.  Eyes:     General:        Right eye: No discharge.        Left eye: No discharge.     Extraocular Movements: Extraocular movements intact.     Comments: No nystagmus  Cardiovascular:     Rate and Rhythm: Normal rate and regular rhythm.     Heart sounds: Normal heart sounds. No murmur heard.    No gallop.  Pulmonary:     Effort: Pulmonary effort is normal. No respiratory distress.     Breath sounds: Normal breath sounds. No wheezing, rhonchi or rales.  Abdominal:     General: Bowel sounds are normal. There is no distension.     Palpations: Abdomen is soft.     Tenderness: There is no abdominal tenderness.  Musculoskeletal:     Cervical back: Neck supple. No tenderness.     Comments: RUE 5/5 in Biceps, triceps, WE, grip and FA LUE- 1/5 in same muscles RLE- 5/5 in HF.KE. KF. DF and PF LLE- 0-1/5 in same muscles  Skin:    General: Skin is warm and dry.     Comments: No skin breakdown seen  Neurological:     Mental Status: He is alert.     Comments: Patient is alert. Mild dysarthria and intelligible. Makes eye contact with examiner.  Provides name and age.  He  doe show some STM recall deficits.  Follows simple commands. Leaning on R side in  bed Intact to light touch in all 4 extremities  Psychiatric:        Mood and Affect: Mood normal.        Behavior: Behavior normal.        Lab Results Last 48 Hours        Results for orders placed or performed during the hospital encounter of 07/19/22 (from the past 48 hour(s))  CBC     Status: None    Collection Time: 07/23/22  3:52 AM  Result Value Ref Range    WBC 9.0 4.0 - 10.5 K/uL    RBC 4.69 4.22 - 5.81 MIL/uL    Hemoglobin 13.4 13.0 - 17.0 g/dL    HCT 40.3 39.0 - 52.0 %    MCV 85.9 80.0 - 100.0 fL    MCH 28.6 26.0 - 34.0 pg    MCHC 33.3 30.0 - 36.0 g/dL    RDW 13.2 11.5 - 15.5 %    Platelets 235 150 - 400 K/uL    nRBC 0.0 0.0 -  0.2 %      Comment: Performed at [facility], [place] [address] [place], [place] [address]  Basic metabolic panel     Status: Abnormal    Collection Time: 07/23/22  3:52 AM  Result Value Ref Range    Sodium 137 135 - 145 mmol/L    Potassium 4.5 3.5 - 5.1 mmol/L    Chloride 104 98 - 111 mmol/L    CO2 26 22 - 32 mmol/L    Glucose, Bld 121 (H) 70 - 99 mg/dL      Comment: Glucose reference range applies only to samples taken after fasting for at least 8 hours.    BUN 24 (H) 8 - 23 mg/dL    Creatinine, Ser 1.12 0.61 - 1.24 mg/dL    Calcium 9.4 8.9 - 10.3 mg/dL    GFR, Estimated >60 >60 mL/min      Comment: (NOTE) Calculated using the CKD-EPI Creatinine Equation (2021)      Anion gap 7 5 - 15      Comment: Performed at [facility] [address], [place], [place] [address]       Imaging Results (Last 48 hours)  MR BRAIN WO CONTRAST   Result Date: 07/23/2022 CLINICAL DATA:  Neuro deficit, acute, stroke suspected. EXAM: MRI HEAD WITHOUT CONTRAST TECHNIQUE: Multiplanar, multiecho pulse sequences of the brain and surrounding structures were obtained without intravenous contrast. COMPARISON:  Head CT 07/22/2022 and MRI 07/19/2022 FINDINGS: Brain: There is an acute infarct involving the posterior right corona radiata and extending into the posterior aspect of the right lentiform nucleus which has substantially enlarged from the prior MRI (now 3 cm in AP dimension). There is no associated hemorrhage. T2 hyperintensities elsewhere in the cerebral white matter bilaterally are unchanged and nonspecific but compatible with mild chronic small vessel ischemic disease. Chronic lacunar infarcts are again noted in the left parietal white matter and right thalamus. No mass, midline shift, or extra-axial fluid collection is identified. Mild cerebral atrophy is within normal limits for age. A few scattered chronic cerebral microhemorrhages are again seen. Vascular: Absent flow void in the distal right  vertebral artery which reflects a change from the prior study, with the vessel appearing small but patent on the prior MRA. Other major intracranial vascular flow voids are preserved. Skull and upper cervical spine: Unremarkable bone marrow signal. Sinuses/Orbits: Bilateral cataract extraction. Mild mucosal thickening in the paranasal sinuses. Clear mastoid  air cells. Other: None. IMPRESSION: 1. Increased size of acute infarct involving the right corona radiata and basal ganglia. 2. 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 3. Newly abnormal appearance of the nondominant distal right vertebral artery which may reflect slow flow or occlusion. Electronically Signed   By: Logan Bores M.D.   On: 07/23/2022 14:51    CT Head Wo Contrast   Result Date: 07/22/2022 CLINICAL DATA:  Follow-up examination for stroke. EXAM: CT HEAD WITHOUT CONTRAST TECHNIQUE: Contiguous axial images were obtained from the base of the skull through the vertex without intravenous contrast. RADIATION DOSE REDUCTION: This exam was performed according to the departmental dose-optimization program which includes automated exposure control, adjustment of the mA and/or kV according to patient size and/or use of iterative reconstruction technique. COMPARISON:  Prior MRI from 07/19/2022. FINDINGS: Brain: Age-related cerebral atrophy with chronic small vessel ischemic disease. Remote lacunar infarct noted at the right thalamus. Continued normal interval evolutionary changes noted about the previously identified infarct involving the right frontal corona radiata/centrum semi ovale, relatively similar in size as compared to prior MRI. No regional mass effect or associated hemorrhage. No other visible acute large vessel territory infarct. No intracranial hemorrhage. No mass lesion or midline shift. No hydrocephalus or extra-axial fluid collection. Vascular: No hyperdense vessel. Calcified atherosclerosis present at  skull base. Skull: Scalp soft tissues and calvarium demonstrate no acute finding. Sinuses/Orbits: Globes normal soft tissues within normal limits. Scattered mucoperiosteal thickening present about the sphenoethmoidal and maxillary sinuses. Mastoid air cells remain clear. Other: None. IMPRESSION: 1. Continued normal interval evolutionary changes about the previously identified infarct involving the right frontal corona radiata/centrum semi ovale, relatively similar in size as compared to prior MRI. No regional mass effect or associated hemorrhage. 2. No other new acute intracranial abnormality. 3. Age-related cerebral atrophy with chronic small vessel ischemic disease. Electronically Signed   By: Jeannine Boga M.D.   On: 07/22/2022 23:17          Blood pressure (!) 152/80, pulse (!) 55, temperature 98.6 F (37 C), temperature source Oral, resp. rate 18, height '5\' 8"'$  (1.727 m), SpO2 97 %.   Medical Problem List and Plan: 1. 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  Antithrombotics: -DVT/anticoagulation:  Pharmaceutical: Lovenox             -antiplatelet therapy: Aspirin 81 mg daily and Plavix 75 mg day x3 weeks (end 9/22) then aspirin alone 3. Pain Management: Tylenol as needed 4. Mood/Behavior/Sleep: Provide emotional support             -antipsychotic agents: N/A 5. Neuropsych/cognition: This patient is capable of making decisions on his own behalf. 6. Skin/Wound Care: Routine skin checks 7. Fluids/Electrolytes/Nutrition: Routine in and outs with follow-up chemistries 8.  Permissive hypertension.  Losartan 50 mg nightly resumed 07/24/2022 9.  Hyperlipidemia.  Crestor   I have personally performed a face to face diagnostic evaluation of this patient and formulated the key components of the plan.  Additionally, I have personally reviewed laboratory data,  imaging studies, as well as relevant notes and concur with the physician assistant's documentation above.   The patient's status has not changed from the original H&P.  Any changes in documentation from the acute care chart have been noted  above.         Lavon Paganini Angiulli, PA-C 07/24/2022

## 2022-07-24 NOTE — Evaluation (Signed)
Clinical/Bedside Swallow Evaluation Patient Details  Name: Daniel [name] MRN: [id number] Date of Birth: [birth year]  Today's Date: 07/24/2022 Time: SLP Start Time (ACUTE ONLY): 1325 SLP Stop Time (ACUTE ONLY): 1342 SLP Time Calculation (min) (ACUTE ONLY): 17 min  Past Medical History:  Past Medical History:  Diagnosis Date   Hernia of fascia 2008   repair with many complications    Hypertension    Past Surgical History:  Past Surgical History:  Procedure Laterality Date   APPENDECTOMY     HERNIA REPAIR     HPI:  76 y.o. male presenting with acute dizziness and ataxia, found to have small right acute infarct in centrum semiovale; chronic infarct right thalamus.  Repeat MRI 9/4 reveals extension into the posterior aspect of the right lentiform nucleus which has substantially enlarged from the prior MRI (now 3 cm in AP dimension.) PMH positive for HTN and hernia repair.    Assessment / Plan / Recommendation  Clinical Impression  Swallow evaluation orders received due to progression of right CVA.  Daniel [name] presents with focal CN deficits on left specific to central VII and XII.  He demonstrates adequate oral attention to left side with minimal buccal residue.  He consumes regular solids and thin liquids today with no overt s/s of aspiration.  He does complain of occasional difficulty swallowing bread/meats and generally avoids them. He reports some coughing with PO intake s/p CVA - this was not obaserved today. There is no documented hx of esophageal deficits. He is D/Cing today to [facility]- he may benefit from further SLP f/u for swallowing upon admission.  He may need an esophageal w/u at some point in the future, but referrals could be made after D/C from [facility]. Continue regular diet/ thin liquids; meds whole in liquid. SLP Visit Diagnosis: Dysphagia, unspecified (R13.10)    Aspiration Risk  No limitations    Diet Recommendation     Medication Administration: Whole meds with liquid     Other  Recommendations Oral Care Recommendations: Oral care BID    Recommendations for follow up therapy are one component of a multi-disciplinary discharge planning process, led by the attending physician.  Recommendations may be updated based on patient status, additional functional criteria and insurance authorization.  Follow up Recommendations Acute inpatient rehab (3hours/day)      Assistance Recommended at Discharge Frequent or constant Supervision/Assistance  Functional Status Assessment Patient has had a recent decline in their functional status and demonstrates the ability to make significant improvements in function in a reasonable and predictable amount of time.    Swallow Study   General Date of Onset: 07/19/22 HPI: 76 y.o. male presenting with acute dizziness and ataxia, found to have small right acute infarct in centrum semiovale; chronic infarct right thalamus.  Repeat MRI 9/4 reveals extension into the posterior aspect of the right lentiform nucleus which has substantially enlarged from the prior MRI (now 3 cm in AP dimension.) PMH positive for HTN and hernia repair. Type of Study: Bedside Swallow Evaluation Previous Swallow Assessment: no Diet Prior to this Study: Regular;Thin liquids Temperature Spikes Noted: No Respiratory Status: Room air History of Recent Intubation: No Behavior/Cognition: Alert;Cooperative;Pleasant mood Oral Cavity Assessment: Within Functional Limits Oral Care Completed by SLP: No Oral Cavity - Dentition: Missing dentition Vision: Functional for self-feeding Self-Feeding Abilities: Able to feed self Patient Positioning: Upright in bed Baseline Vocal Quality: Normal Volitional Cough: Strong Volitional Swallow: Able to elicit    Oral/Motor/Sensory Function Overall Oral Motor/Sensory Function: Mild impairment Facial  Symmetry: Suspected CN VII (facial) dysfunction;Abnormal symmetry left Lingual Symmetry: Abnormal symmetry left;Suspected CN XII  (hypoglossal) dysfunction   Ice Chips Ice chips: Within functional limits   Thin Liquid Thin Liquid: Within functional limits    Nectar Thick Nectar Thick Liquid: Not tested   Honey Thick Honey Thick Liquid: Not tested   Puree Puree: Within functional limits   Solid     Solid: Within functional limits      Daniel [name] 07/24/2022,1:57 PM   Daniel Bamberg L. Tivis Ringer, MA CCC/SLP Clinical Specialist - [place] Office number [phone]

## 2022-07-24 NOTE — Progress Notes (Signed)
Inpatient Rehab Admissions Coordinator:  ° °I have a CIR bed for this Pt. Today. RN may call report to [phone]. ° °Maeven Mcdougall, MS, CCC-SLP °Rehab Admissions Coordinator  °[phone] (celll) °[phone] (office) ° °

## 2022-07-24 NOTE — Progress Notes (Signed)
PMR Admission Coordinator Pre-Admission Assessment   Patient: Daniel [name] is an 76 y.o., male MRN: [id number] DOB: 12/25/1945 Height: '5\' 8"'  (172.7 cm) Weight:     Insurance Information HMO:     PPO: yes      PCP:      IPA:      80/20:      OTHER:  PRIMARY: [employer] Medicare       Policy#: [id number]      Subscriber: Pt CM Name: Marcene Brawn       Phone#:  [phone]    Fax#:  [phone] Pt. Approved 1/6-6/06 Pre-Cert#: [id number]      Employer:  Benefits:  Phone #:      Name:  Irene Shipper Date: 11/19/2020 - still active Deductible: does not have OOP Max: $4,500 ($0 met) CIR: $295/day co-pay with a max co-pay of $1,770/admission (6 days) SNF: $0/day co-pay for days 1-20, $196/day co-pay for days 21-100; limited to 100 days/cal yr. Outpatient:  $35/visit co-pay Home Health:  100% coverage DME: 80% coverage; 20% co-insurance Providers: in network  SECONDARY:       Policy#:      Phone#:    Development worker, community:       Phone#:    The Engineer, [employer]" for patients in Inpatient Rehabilitation Facilities with attached "Privacy Act [place] Records" was provided and verbally reviewed with: Family   Emergency Contact Information Contact Information       Name Relation Home Work [place] Daughter     [phone]    [place], [place]     [phone]           Current Medical History  Patient Admitting Diagnosis: CVA  History of Present Illness: Daniel [name] is a 76 year old right-handed male with history of hypertension as well as former tobacco use.  Per chart review patient lives with his daughter.  1 level home 3 steps to entry.  Independent driving prior to admission.  Presented 07/19/2022 with left-sided weakness and dizziness of acute onset.  No nausea vomiting or fever reported.  Blood pressure 205/120.  CT/MRI showed small acute infarct right centrum semiovale.  Mild to moderate chronic microvascular ischemic change involving the white  matter and right thalamus.  MRA showed no large vessel occlusion.  Admission chemistry unremarkable except glucose 103, alcohol negative, urine drug screen negative.  Echocardiogram with ejection fraction of 55 to 60% no wall motion abnormalities grade 1 diastolic dysfunction.  Neurology follow-up placed on aspirin as well as Plavix for CVA prophylaxis.  Hospital course increasing left-sided weakness 07/23/2022 with MRI completed showing increased size of acute infarct involving the right corona radiata and basal ganglia.  MRI findings discussed with neurology no specific changes recommended and remains on low-dose aspirin with Plavix x3 weeks followed by aspirin alone.  Placed on Lovenox for DVT prophylaxis.  Monitoring of permissive hypertension patient on losartan 50 mg prior to admission.  Tolerating a regular consistency diet.  Therapy evaluations completed due to patient's left-sided weakness and decreased functional mobility was admitted for a comprehensive rehab program     Complete NIHSS TOTAL: 5   [name] medical record from [facility]  has been reviewed by the rehabilitation admission coordinator and physician.   Past Medical History      Past Medical History:  Diagnosis Date   Hernia of fascia 2008    repair with many complications    Hypertension        Has the  patient had major surgery during 100 days prior to admission? No   Family History   family history includes Alcohol abuse in his father; Depression in his sister; High Cholesterol in his sister; Hypertension in his brother, mother, and sister; Osteoporosis in his mother; Thyroid disease in his mother.   Current Medications   Current Facility-Administered Medications:    acetaminophen (TYLENOL) tablet 650 mg, 650 mg, Oral, Q4H PRN **OR** [DISCONTINUED] acetaminophen (TYLENOL) 160 MG/5ML solution 650 mg, 650 mg, Per Tube, Q4H PRN **OR** acetaminophen (TYLENOL) suppository 650 mg, 650 mg, Rectal, Q4H PRN,  Espinoza, Alejandra, DO   aspirin EC tablet 81 mg, 81 mg, Oral, Daily, Espinoza, Alejandra, DO, 81 mg at 07/22/22 1046   clopidogrel (PLAVIX) tablet 75 mg, 75 mg, Oral, Daily, Espinoza, Alejandra, DO, 75 mg at 07/22/22 1046   enoxaparin (LOVENOX) injection 40 mg, 40 mg, Subcutaneous, Q24H, Espinoza, Alejandra, DO, 40 mg at 07/21/22 2148   Oral care mouth rinse, 15 mL, Mouth Rinse, PRN, Leeanne Rio, MD   rosuvastatin (CRESTOR) tablet 20 mg, 20 mg, Oral, Daily, Espinoza, Alejandra, DO, 20 mg at 07/22/22 1046   senna-docusate (Senokot-S) tablet 1 tablet, 1 tablet, Oral, QHS PRN, Sharion Settler, DO   Patients Current Diet:  Diet Order                  Diet Heart Room service appropriate? Yes; Fluid consistency: Thin  Diet effective now                         Precautions / Restrictions Precautions Precautions: Fall Restrictions Weight Bearing Restrictions: No    Has the patient had 2 or more falls or a fall with injury in the past year? Yes   Prior Activity Level Community (5-7x/wk): Pt. went out daily PTA   Prior Functional Level Self Care: Did the patient need help bathing, dressing, using the toilet or eating? Independent   Indoor Mobility: Did the patient need assistance with walking from room to room (with or without device)? Independent   Stairs: Did the patient need assistance with internal or external stairs (with or without device)? Independent   Functional Cognition: Did the patient need help planning regular tasks such as shopping or remembering to take medications? Needed some help   Patient Information Are you of Hispanic, Latino/a,or Spanish origin?: A. No, not of Hispanic, Latino/a, or Spanish origin What is your race?: A. White Do you need or want an interpreter to communicate with a doctor or health care staff?: 0. No   [name] Response To:  Health Literacy and Transportation Is the patient able to respond to health literacy and  transportation needs?: Yes Health Literacy - How often do you need to have someone help you when you read instructions, pamphlets, or other written material from your doctor or pharmacy?: Never In the past 12 months, has lack of transportation kept you from medical appointments or from getting medications?: No In the past 12 months, has lack of transportation kept you from meetings, work, or from getting things needed for daily living?: No   Home Assistive Devices / Excel Devices/Equipment: Blood pressure cuff Home Equipment: Cane - single point   Prior Device Use: Indicate devices/aids used by the patient prior to current illness, exacerbation or injury? None of the above   Current Functional Level Cognition   Overall Cognitive Status: Impaired/Different from baseline Current Attention Level: Selective Orientation Level: Oriented X4 Following Commands: Follows one  step commands with increased time, Follows one step commands consistently, Follows multi-step commands inconsistently Safety/Judgement: Decreased awareness of deficits General Comments: Pt appears with fair cognition, frequently requiring repeat instructions due to being hard of hearing. Pt requiring verbal cues for safety during transfers and increased time for awareness of postural deficits sitting EOB and standing. Pt with good attempts to self-correct after initial cueing.    Extremity Assessment (includes Sensation/Coordination)   Upper Extremity Assessment: Generalized weakness, LUE deficits/detail LUE Deficits / Details: Decreased speed of coordination, weaker as compared to R. Although R dominant, appears to be decreased as compared to baseline (4/5) LUE Coordination: decreased fine motor  Lower Extremity Assessment: Defer to PT evaluation LLE Deficits / Details: decreased coordination with toe taps     ADLs   Overall ADL's : Needs assistance/impaired Eating/Feeding: Set up, Bed level Grooming:  Moderate assistance, Standing Grooming Details (indicate cue type and reason): Up to mod A for standing balance with RW. Would anticipate need for mod A for balance during bil grooming tasks. Upper Body Bathing: Min guard, Sitting Lower Body Bathing: Sit to/from stand, Minimal assistance Upper Body Dressing : Minimal assistance, Sitting Upper Body Dressing Details (indicate cue type and reason): balance Lower Body Dressing: Minimal assistance, Sitting/lateral leans Lower Body Dressing Details (indicate cue type and reason): min A to maintain figure 4 to don socks. Noting decreased dexterity of L hand Toilet Transfer: Moderate assistance, Minimal assistance, Stand-pivot, Rolling walker (2 wheels), BSC/3in1 Toilet Transfer Details (indicate cue type and reason): Simulated in room Functional mobility during ADLs: Minimal assistance, Moderate assistance, Rolling walker (2 wheels) General ADL Comments: Mod for functional mobility due to L lateral lean. Pt limited by balance, strength, cognition, and decreased midline orientation.     Mobility   Overal bed mobility: Needs Assistance Bed Mobility: Supine to Sit Supine to sit: HOB elevated, Min assist Sit to supine: Min guard General bed mobility comments: min A for trunk elevation.     Transfers   Overall transfer level: Needs assistance Equipment used: Rolling walker (2 wheels) Transfers: Sit to/from Stand Sit to Stand: Min assist General transfer comment: Assist for power up     Ambulation / Gait / Stairs / Wheelchair Mobility   Ambulation/Gait Ambulation/Gait assistance: Mod assist, +2 physical assistance, +2 safety/equipment Gait Distance (Feet): 20 Feet (20x1 15x1) Assistive device: Rolling walker (2 wheels), 2 person hand held assist Gait Pattern/deviations: Step-to pattern, Step-through pattern, Decreased stride length, Decreased dorsiflexion - left, Drifts right/left, Wide base of support, Ataxic, Decreased step length - right,  Decreased step length - left, Decreased weight shift to right, Knees buckling, Trunk flexed General Gait Details: attempted gait with +2 HHA. Physical assist required at hips to assist with weight shift to the R. Cues for weight shift. Pt very unsteady with heavy lean to the L with ambulation. attempted additional ambulation with RW. Pt did poorly with RW as he kept his knees and trunk flexed with RW. Switched to +2HHA for remainder of session. Gait velocity: slowed Gait velocity interpretation: <1.31 ft/sec, indicative of household ambulator Stairs: Yes Stairs assistance: Mod assist Stair Management: Two rails, Step to pattern, Forwards Number of Stairs: 2 General stair comments: using rails and difficulty with stepping ataxic quality and high risk for falls unaided     Posture / Balance Dynamic Sitting Balance Sitting balance - Comments: Requiring assist to correct R lean. Pt aware he is leaning and was able to hold himself upright with use of bedrail. Balance Overall balance  assessment: Needs assistance, History of Falls Sitting-balance support: Feet supported Sitting balance-Leahy Scale: Poor Sitting balance - Comments: Requiring assist to correct R lean. Pt aware he is leaning and was able to hold himself upright with use of bedrail. Postural control: Right lateral lean Standing balance support: Bilateral upper extremity supported, During functional activity Standing balance-Leahy Scale: Poor Standing balance comment: Up to mod A.     Special needs/care consideration Skin intact    Previous Home Environment (from acute therapy documentation) Living Arrangements: Children Available Help at Discharge: Family, Available 24 hours/day Type of Home: House Home Layout: One level Home Access: Stairs to enter Entrance Stairs-Rails: Right, Left Entrance Stairs-Number of Steps: 3 Bathroom Shower/Tub: Multimedia programmer: Handicapped height Bathroom Accessibility: Yes How  Accessible: Accessible via wheelchair, Accessible via walker [place]: No Additional Comments: lives with daughter who works only one day a week and 76 y/o adopted daughter   Discharge Living Setting Plans for Discharge Living Setting: [name] home Type of Home at Discharge: House Discharge Home Layout: One level Discharge Home Access: Stairs to enter Entrance Stairs-Rails: Right, Left Entrance Stairs-Number of Steps: 3 Discharge Bathroom Shower/Tub: Walk-in shower Discharge Bathroom Toilet: Handicapped height Discharge Bathroom Accessibility: Yes How Accessible: Accessible via walker Does the patient have any problems obtaining your medications?: No   Social/Family/Support Systems Patient Roles: Other (Comment) Contact Information: [phone] Anticipated Caregiver: Levada Dy (daughter) Anticipated Caregiver's Contact Information: 24/7 Ability/Limitations of Caregiver: min A Discharge Plan Discussed with Primary Caregiver: Yes Is Caregiver In Agreement with Plan?: Yes Does Caregiver/Family have Issues with Lodging/Transportation while Pt is in Rehab?: No   Goals Patient/Family Goal for Rehab: PT/OT/SLP Supervision Expected length of stay: 8-10 days Pt/Family Agrees to Admission and willing to participate: Yes Program Orientation Provided & Reviewed with Pt/Caregiver Including Roles  & Responsibilities: No   Decrease burden of Care through IP rehab admission: none  Possible need for SNF placement upon discharge: not anticipated     Patient Condition: I have reviewed medical records from [facility], spoken with CM, and patient. I met with patient at the bedside for inpatient rehabilitation assessment.  Patient will benefit from ongoing PT, OT, and SLP, can actively participate in 3 hours of therapy a day 5 days of the week, and can make measurable gains during the admission.  Patient will also benefit from the coordinated team approach during an  Inpatient Acute Rehabilitation admission.  The patient will receive intensive therapy as well as Rehabilitation physician, nursing, social worker, and care management interventions.  Due to safety, skin/wound care, disease management, and medication administration the patient requires 24 hour a day rehabilitation nursing.  The patient is currently mod+2 with mobility and basic ADLs.  Discharge setting and therapy post discharge at home with home health is anticipated.  Patient has agreed to participate in the Acute Inpatient Rehabilitation Program and will admit today.   Preadmission Screen Completed By:  Genella Mech, 07/22/2022 2:17 PM ______________________________________________________________________   Discussed status with Dr. Dagoberto Ligas on 07/24/22 at 29 and received approval for admission today.   Admission Coordinator:  Genella Mech, CCC-SLP, time 1000/Date 07/24/22    Assessment/Plan: Diagnosis: Does the need for close, 24 hr/day Medical supervision in concert with the patient's rehab needs make it unreasonable for this patient to be served in a less intensive setting? 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 Yes Does the patient require coordinated care of a physician, rehab nurse, PT, OT, and SLP to address physical and functional deficits in the context of the above medical diagnosis(es)? Yes Addressing deficits in the following areas: balance, endurance, locomotion, strength, transferring, bowel/bladder control, bathing, dressing, feeding, grooming, toileting, cognition, and speech Can the patient actively participate in an intensive therapy program of at least 3 hrs of therapy 5 days a week?  Yes The potential for patient to make measurable gains while on inpatient rehab is good Anticipated functional outcomes upon discharge from inpatient rehab: supervision PT, supervision OT, supervision SLP Estimated rehab length of stay to reach the above functional goals is: 8-10 days at w/c level? Anticipated discharge destination: Home 10. Overall Rehab/Functional Prognosis: good     MD Signature:

## 2022-07-24 NOTE — Progress Notes (Signed)
Daily Progress Note Intern Pager: [phone]  Patient name: Daniel [name] Medical record number: [id number] Date of birth: Dec 14, 1945 Age: 76 y.o. Gender: male  Primary Care Provider: Lind Covert, MD Consultants: Neurology, CIR  Code Status: DNR  Pt Overview and Major Events to Date:  8/31: Admitted to [facility] 9/3: new onset LUE and LLE weakness   Assessment and Plan: 76 y.o. male with a past medical history of HTN presenting with left lower extremity weakness, ataxia, lightheadedness, presyncope admitted due to acute infarct right thalamus, extended on 9/3 to corona radiata and basal ganglia, presenting with worsening LUE and LLE weakness.  * CVA (cerebral vascular accident) ([place]) 9/3 new onset left upper extremity weakness.  Head CT 9/3 showed no acute brain abnormality.  MRI showed small acute infarct right centrum semiovale. Carotid U/S <40% stenosis bilateral. Risk stratifications so far notable for LDL 87, A1c 6.1%. Planning acute inpatient rehab for dispo.  -Neurology following, appreciate continued involvement and recs -Plavix + ASA for 3 weeks, followed by ASA only.  -Continuous cardiac monitoring for arrhythmia -Continue crestor daily  -Allow for permissive HTN, restart antihypertensive medication per neurology  -Neuro checks q4h  -Potential disposition CIR - MRI head ordered per neuro   Prediabetes Hgb A1c 6.1%. -Continue statin for secondary prevention given CVA -Encourage continued lifestyle modifications for improved preventative measures   Essential hypertension Home meds include losartan 50 mg.  -monitor BP -continue to hold losartan while in permissive hypertension window, restart per neuro recs -patient will need continued BP control outpatient to prevent future stroke    FEN/GI: heart healthy  PPx: lovenox 40  Dispo:CIR today.   Subjective:  Patient was seen at bedside working with inpatient rehab. He is continuously leaning to his left side  and has weakness in his LUE and LLE. Daughter and patient have no concerns or questions at this time.   Objective: Temp:  [98 F (36.7 C)-98.7 F (37.1 C)] 98.1 F (36.7 C) (09/05 0746) Pulse Rate:  [52-65] 60 (09/05 0746) Resp:  [16-18] 17 (09/05 0746) BP: (140-180)/(77-84) 141/77 (09/05 0746) SpO2:  [95 %-98 %] 97 % (09/05 0746) Physical Exam: General: Alert, oriented, in no apparent distress Cardiovascular: RRR S1 S2 present, no rubs, murmurs or gallops.  Respiratory: CTA bilaterally  Neuro: Left sided hemiparesis. Left sided facial droop. 0/5 strength in left upper extremity with making a fist.  Laboratory: Most recent CBC Lab Results  Component Value Date   WBC 9.0 07/23/2022   HGB 13.4 07/23/2022   HCT 40.3 07/23/2022   MCV 85.9 07/23/2022   PLT 235 07/23/2022   Most recent BMP    Latest Ref Rng & Units 07/23/2022    3:52 AM  BMP  Glucose 70 - 99 mg/dL 121   BUN 8 - 23 mg/dL 24   Creatinine 0.61 - 1.24 mg/dL 1.12   Sodium 135 - 145 mmol/L 137   Potassium 3.5 - 5.1 mmol/L 4.5   Chloride 98 - 111 mmol/L 104   CO2 22 - 32 mmol/L 26   Calcium 8.9 - 10.3 mg/dL 9.4     MR BRAIN WO CONTRAST   IMPRESSION: 1. Increased size of acute infarct involving the right corona radiata and basal ganglia. 2. 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 3. Newly abnormal appearance of the nondominant distal right vertebral artery which may reflect slow flow or occlusion.   Lupita Raider, Medical Student 07/24/2022, 8:18 AM [place]  [place] Intern pager: [phone], text pages welcome Secure chat group [place]

## 2022-07-24 NOTE — Discharge Summary (Addendum)
[facility] Discharge Summary  Patient name: Daniel [name] Medical record number: [id number] Date of birth: 1946-01-26 Age: 76 y.o. Gender: male Date of Admission: 07/19/2022  Date of Discharge: 07/24/22 Admitting Physician: Leeanne Rio, MD  Primary Care Provider: Lind Covert, MD Consultants: Neurology   Indication for Hospitalization: LLE weakness, ataxia, lightheadedness, presyncope   Brief Hospital Course:  Daniel [name] is a 76 y.o. 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 His hospital course is outlined below:    * CVA (cerebral vascular accident) [facility]) Patient presented to the ED with trouble walking and left lower extremity weakness. MRI showed small acute infarct right centrum semiovale.  Neurology was consulted and recommended 3 weeks DAPT x3 weeks with ASA continuing after, patient was also started on statin.  A few days later, he began to have worsening left lower extremity weakness and new onset right upper extremity weakness.  A repeat MRI was done that showed extension of the original infarct.  Neurology had no change of management and allowed for permissive hypertension for 24 to 48 hours with gradual lowering within 3 to 5 days. Patient was restarted on home Losartan. PT/OT recommended CIR and patient was discharged for physical rehab.   Essential hypertension Patient remained hypertensive during his hospital stay due to permissive hypertension for his stroke.  Neurology recommended present hypertension for 24 to 48 hours.  He was discharged to CIR, where his blood pressure is to be monitored on his home Losartan.   Chest pressure Patient reported chest pressure and shortness of breath on the night of discharge. Work-up for ACS was  negative with normal CXR, troponin of 6, and EKG largely normal. Patient was monitored and did not require any oxygen or supportive care, felt safe for discharge.   Follow up: Monitor blood pressure. Patient was discharged with home Losartan and goal of blood pressure <130/80 as long as patient is able to tolerate. DAPT with ASA 81 and Plavix '75mg'$  x3 weeks (end 9/22) followed by ASA alone.  Rosuvastatin started in the hospital, ensure compliance Follow up outpatient with neurology.    Discharge Diagnoses/Problem List:  Principal Problem for Admission: right centrum semiovale (right thalamus) infarct  Other Problems addressed during stay:  HTN HLD    Disposition: CIR   Discharge Condition: Stable   Discharge Exam:  Vitals:   07/24/22 0521 07/24/22 0746  BP: (!) 152/80 (!) 141/77  Pulse: (!) 55 60  Resp: 18 17  Temp: 98.6 F (37 C) 98.1 F (36.7 C)  SpO2: 97% 97%  Physical Exam as noted by Joselyn Glassman, MS4: General: Alert, oriented, in no apparent distress Cardiovascular: RRR S1 S2 present, no rubs, murmurs or gallops.  Respiratory: CTA bilaterally  Neuro: Left sided hemiparesis. Left sided facial droop. 0/5 strength in left upper extremity with making a fist.  Significant Procedures: None   Significant Labs and Imaging:  Recent Labs  Lab 07/23/22 0352  WBC 9.0  HGB 13.4  HCT 40.3  PLT 235   Recent Labs  Lab 07/23/22 0352  NA 137  K 4.5  CL 104  CO2 26  GLUCOSE 121*  BUN 24*  CREATININE 1.12  CALCIUM 9.4    MR BRAIN WO CONTRAST  Result Date: 07/23/2022 CLINICAL DATA:  Neuro deficit, acute, stroke suspected. EXAM: MRI HEAD  WITHOUT CONTRAST TECHNIQUE: Multiplanar, multiecho pulse sequences of the brain and surrounding structures were obtained without intravenous contrast. COMPARISON:  Head CT 07/22/2022 and MRI 07/19/2022 FINDINGS: Brain: There is an acute infarct involving the posterior right corona radiata and extending into the posterior aspect of the  right lentiform nucleus which has substantially enlarged from the prior MRI (now 3 cm in AP dimension). There is no associated hemorrhage. T2 hyperintensities elsewhere in the cerebral white matter bilaterally are unchanged and nonspecific but compatible with mild chronic small vessel ischemic disease. Chronic lacunar infarcts are again noted in the left parietal white matter and right thalamus. No mass, midline shift, or extra-axial fluid collection is identified. Mild cerebral atrophy is within normal limits for age. A few scattered chronic cerebral microhemorrhages are again seen. Vascular: Absent flow void in the distal right vertebral artery which reflects a change from the prior study, with the vessel appearing small but patent on the prior MRA. Other major intracranial vascular flow voids are preserved. Skull and upper cervical spine: Unremarkable bone marrow signal. Sinuses/Orbits: Bilateral cataract extraction. Mild mucosal thickening in the paranasal sinuses. Clear mastoid air cells. Other: None. IMPRESSION: 1. Increased size of acute infarct involving the right corona radiata and basal ganglia. 2. 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 3. Newly abnormal appearance of the nondominant distal right vertebral artery which may reflect slow flow or occlusion. Electronically Signed   By: Logan Bores M.D.   On: 07/23/2022 14:51   CT Head Wo Contrast  Result Date: 07/22/2022 CLINICAL DATA:  Follow-up examination for stroke. EXAM: CT HEAD WITHOUT CONTRAST TECHNIQUE: Contiguous axial images were obtained from the base of the skull through the vertex without intravenous contrast. RADIATION DOSE REDUCTION: This exam was performed according to the departmental dose-optimization program which includes automated exposure control, adjustment of the mA and/or kV according to patient size and/or use of iterative reconstruction technique. COMPARISON:  Prior MRI from 07/19/2022.  FINDINGS: Brain: Age-related cerebral atrophy with chronic small vessel ischemic disease. Remote lacunar infarct noted at the right thalamus. Continued normal interval evolutionary changes noted about the previously identified infarct involving the right frontal corona radiata/centrum semi ovale, relatively similar in size as compared to prior MRI. No regional mass effect or associated hemorrhage. No other visible acute large vessel territory infarct. No intracranial hemorrhage. No mass lesion or midline shift. No hydrocephalus or extra-axial fluid collection. Vascular: No hyperdense vessel. Calcified atherosclerosis present at skull base. Skull: Scalp soft tissues and calvarium demonstrate no acute finding. Sinuses/Orbits: Globes normal soft tissues within normal limits. Scattered mucoperiosteal thickening present about the sphenoethmoidal and maxillary sinuses. Mastoid air cells remain clear. Other: None. IMPRESSION: 1. Continued normal interval evolutionary changes about the previously identified infarct involving the right frontal corona radiata/centrum semi ovale, relatively similar in size as compared to prior MRI. No regional mass effect or associated hemorrhage. 2. No other new acute intracranial abnormality. 3. Age-related cerebral atrophy with chronic small vessel ischemic disease. Electronically Signed   By: Jeannine Boga M.D.   On: 07/22/2022 23:17   VAS US CAROTID  Result Date: 07/20/2022 Carotid Arterial Duplex Study Patient Name:  Daniel [name]  Date of Exam:   07/20/2022 Medical Rec #: [id number]        Accession #:    [id number] Date of Birth: Feb 28, 1946         Patient Gender: M Patient Age:   3 years Exam Location:  [facility] Procedure:  VAS US CAROTID Referring Phys: Janine Ores --------------------------------------------------------------------------------  Indications:  CVA and Other cardiovascular symptoms. Risk Factors: Hypertension, past history of smoking, prior CVA.  Performing Technologist: Bobetta Lime BS, RVT  Examination Guidelines: A complete evaluation includes B-mode imaging, spectral Doppler, color Doppler, and power Doppler as needed of all accessible portions of each vessel. Bilateral testing is considered an integral part of a complete examination. Limited examinations for reoccurring indications may be performed as noted.  Right Carotid Findings: +----------+--------+--------+--------+-------------------------+--------+           PSV cm/sEDV cm/sStenosisPlaque Description       Comments +----------+--------+--------+--------+-------------------------+--------+ CCA Prox  56      17                                                +----------+--------+--------+--------+-------------------------+--------+ CCA Distal58      14                                                +----------+--------+--------+--------+-------------------------+--------+ ICA Prox  65      22              heterogenous and calcific         +----------+--------+--------+--------+-------------------------+--------+ ICA Distal51      15                                                +----------+--------+--------+--------+-------------------------+--------+ ECA       69      11              heterogenous and calcific         +----------+--------+--------+--------+-------------------------+--------+ +----------+--------+-------+----------------+-------------------+           PSV cm/sEDV cmsDescribe        Arm Pressure (mmHG) +----------+--------+-------+----------------+-------------------+ [id number]            Multiphasic, WNL                    +----------+--------+-------+----------------+-------------------+ +---------+--------+--+--------+-+---------+ VertebralPSV cm/s45EDV cm/s7Antegrade +---------+--------+--+--------+-+---------+  Left Carotid Findings: +----------+--------+--------+--------+-------------------------+--------+            PSV cm/sEDV cm/sStenosisPlaque Description       Comments +----------+--------+--------+--------+-------------------------+--------+ CCA Prox  78      11                                                +----------+--------+--------+--------+-------------------------+--------+ CCA Distal58      17                                                +----------+--------+--------+--------+-------------------------+--------+ ICA Prox  50      18              heterogenous and calcific         +----------+--------+--------+--------+-------------------------+--------+ ICA Distal64      27                                                +----------+--------+--------+--------+-------------------------+--------+  ECA       76      14              heterogenous                      +----------+--------+--------+--------+-------------------------+--------+ +----------+--------+--------+----------------+-------------------+           PSV cm/sEDV cm/sDescribe        Arm Pressure (mmHG) +----------+--------+--------+----------------+-------------------+ Subclavian109             Multiphasic, WNL                    +----------+--------+--------+----------------+-------------------+ +---------+--------+--+--------+--+---------+ VertebralPSV cm/s45EDV cm/s11Antegrade +---------+--------+--+--------+--+---------+   Summary: Right Carotid: Velocities in the right ICA are consistent with a 1-39% stenosis. Left Carotid: Velocities in the left ICA are consistent with a 1-39% stenosis. Vertebrals:  Bilateral vertebral arteries demonstrate antegrade flow. Subclavians: Normal flow hemodynamics were seen in bilateral subclavian              arteries. *See table(s) above for measurements and observations.  Electronically signed by Antony Contras MD on 07/20/2022 at 2:41:01 PM.    Final    ECHOCARDIOGRAM COMPLETE  Result Date: 07/19/2022    ECHOCARDIOGRAM REPORT   Patient Name:   Daniel [name] Date of Exam: 07/19/2022 Medical Rec #:  [id number]       Height:       68.0 in Accession #:    [id number]      Weight:       188.4 lb Date of Birth:  [birth year]        BSA:          1.992 m Patient Age:    89 years        BP:           165/81 mmHg Patient Gender: M               HR:           46 bpm. Exam Location:  Inpatient Procedure: 2D Echo Indications:    stroke  History:        Patient has no prior history of Echocardiogram examinations.                 Risk Factors:Hypertension.  Sonographer:    Johny Chess RDCS Referring Phys: [place]  1. Left ventricular ejection fraction, by estimation, is 55 to 60%. Left ventricular ejection fraction by PLAX is 58 %. The left ventricle has normal function. The left ventricle has no regional wall motion abnormalities. Left ventricular diastolic parameters are consistent with Grade I diastolic dysfunction (impaired relaxation). Elevated left ventricular end-diastolic pressure.  2. Right ventricular systolic function is normal. The right ventricular size is normal. There is normal pulmonary artery systolic pressure.  3. Left atrial size was mildly dilated.  4. The mitral valve is normal in structure. Trivial mitral valve regurgitation. No evidence of mitral stenosis.  5. The aortic valve is calcified. There is mild calcification of the aortic valve. There is mild thickening of the aortic valve. Aortic valve regurgitation is trivial. No aortic stenosis is present.  6. Aortic dilatation noted. There is mild dilatation of the ascending aorta, measuring 37 mm.  7. The inferior vena cava is normal in size with greater than 50% respiratory variability, suggesting right atrial pressure of 3 mmHg. FINDINGS  Left Ventricle: Left ventricular ejection fraction, by estimation, is 55 to 60%. Left ventricular ejection fraction by  PLAX is 58 %. The left ventricle has normal function. The left ventricle has no regional wall motion abnormalities. The left  ventricular internal cavity size was normal in size. There is no left ventricular hypertrophy. Left ventricular diastolic parameters are consistent with Grade I diastolic dysfunction (impaired relaxation). Elevated left ventricular end-diastolic pressure. Right Ventricle: The right ventricular size is normal. No increase in right ventricular wall thickness. Right ventricular systolic function is normal. There is normal pulmonary artery systolic pressure. The tricuspid regurgitant velocity is 1.88 m/s, and  with an assumed right atrial pressure of 3 mmHg, the estimated right ventricular systolic pressure is 91.6 mmHg. Left Atrium: Left atrial size was mildly dilated. Right Atrium: Right atrial size was normal in size. Pericardium: There is no evidence of pericardial effusion. Mitral Valve: The mitral valve is normal in structure. There is mild thickening of the mitral valve leaflet(s). There is mild calcification of the mitral valve leaflet(s). Mild mitral annular calcification. Trivial mitral valve regurgitation. No evidence  of mitral valve stenosis. Tricuspid Valve: The tricuspid valve is normal in structure. Tricuspid valve regurgitation is trivial. No evidence of tricuspid stenosis. Aortic Valve: The aortic valve is calcified. There is mild calcification of the aortic valve. There is mild thickening of the aortic valve. Aortic valve regurgitation is trivial. No aortic stenosis is present. Aortic valve mean gradient measures 5.0 mmHg. Aortic valve peak gradient measures 9.7 mmHg. Aortic valve area, by VTI measures 1.66 cm. Pulmonic Valve: The pulmonic valve was normal in structure. Pulmonic valve regurgitation is not visualized. No evidence of pulmonic stenosis. Aorta: Aortic dilatation noted. There is mild dilatation of the ascending aorta, measuring 37 mm. Venous: The inferior vena cava is normal in size with greater than 50% respiratory variability, suggesting right atrial pressure of 3 mmHg. IAS/Shunts: No  atrial level shunt detected by color flow Doppler.  LEFT VENTRICLE PLAX 2D LV EF:         Left            Diastology                ventricular     LV e' medial:    5.00 cm/s                ejection        LV E/e' medial:  17.6                fraction by     LV e' lateral:   6.31 cm/s                PLAX is 58      LV E/e' lateral: 14.0                %. LVIDd:         4.30 cm LVIDs:         3.00 cm LV PW:         1.10 cm LV IVS:        0.80 cm LVOT diam:     1.70 cm LV SV:         63 LV SV Index:   32 LVOT Area:     2.27 cm  LV Volumes (MOD) LV vol d, MOD    90.4 ml A4C: LV vol s, MOD    47.3 ml A4C: LV SV MOD A4C:   90.4 ml RIGHT VENTRICLE             IVC RV S  prime:     11.10 cm/s  IVC diam: 1.70 cm TAPSE (M-mode): 3.2 cm LEFT ATRIUM             Index        RIGHT ATRIUM           Index LA diam:        3.80 cm 1.91 cm/m   RA Area:     10.20 cm LA Vol (A2C):   39.0 ml 19.57 ml/m  RA Volume:   19.20 ml  9.64 ml/m LA Vol (A4C):   62.8 ml 31.52 ml/m LA Biplane Vol: 52.4 ml 26.30 ml/m  AORTIC VALVE AV Area (Vmax):    1.82 cm AV Area (Vmean):   1.67 cm AV Area (VTI):     1.66 cm AV Vmax:           156.00 cm/s AV Vmean:          105.000 cm/s AV VTI:            0.381 m AV Peak Grad:      9.7 mmHg AV Mean Grad:      5.0 mmHg LVOT Vmax:         125.00 cm/s LVOT Vmean:        77.400 cm/s LVOT VTI:          0.279 m LVOT/AV VTI ratio: 0.73  AORTA Ao Root diam: 3.50 cm Ao Asc diam:  3.70 cm MITRAL VALVE                TRICUSPID VALVE MV Area (PHT): 3.12 cm     TR Peak grad:   14.1 mmHg MV Decel Time: 243 msec     TR Vmax:        188.00 cm/s MV E velocity: 88.10 cm/s MV A velocity: 108.00 cm/s  SHUNTS MV E/A ratio:  0.82         Systemic VTI:  0.28 m                             Systemic Diam: 1.70 cm Skeet Latch MD Electronically signed by Skeet Latch MD Signature Date/Time: 07/19/2022/7:40:21 PM    Final    MR Brain W and Wo Contrast  Result Date: 07/19/2022 CLINICAL DATA:  Acute neuro deficit.  Dizziness and left leg weakness. EXAM: MRI HEAD WITHOUT AND WITH CONTRAST MRA HEAD WITHOUT CONTRAST TECHNIQUE: Multiplanar, multi-echo pulse sequences of the brain and surrounding structures were acquired without and with intravenous contrast. Angiographic images of the Circle of Willis were acquired using MRA technique without intravenous contrast. CONTRAST:  8.54m GADAVIST GADOBUTROL 1 MMOL/ML IV SOLN COMPARISON:  CT head 07/19/2022 FINDINGS: MRI HEAD FINDINGS Brain: Image quality degraded by moderate motion and artifact. Small acute infarct in the right centrum semiovale. No other acute infarct Ventricle size normal. Chronic infarct right thalamus. Chronic microvascular ischemic changes in the white matter. Chronic microhemorrhage right posterior temporal lobe. No mass lesion Normal enhancement postcontrast administration. Image quality degraded by motion. Vascular: Normal arterial flow voids. Skull and upper cervical spine: No focal skeletal lesion. Sinuses/Orbits: Mucosal edema paranasal sinuses. Bilateral cataract extraction Other: None MRA HEAD FINDINGS Anterior circulation: Internal carotid artery patent bilaterally. Anterior and middle cerebral arteries patent. There is decreased signal in the right MCA at the bifurcation likely due to tortuosity. Distal right MCA branches appear patent Posterior circulation: Both vertebral arteries patent to the basilar. Left vertebral artery dominant.  Basilar patent. Decreased signal in the posterior cerebral arteries bilaterally likely due to tortuosity. Anatomic variants: None Image quality degraded by moderate motion IMPRESSION: 1. Small acute infarct right centrum semiovale 2. 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 Motion degraded study of the brain. 3. MRA is limited by vessel tortuosity and motion. No large vessel occlusion identified Electronically Signed   By: Franchot Gallo M.D.   On: 07/19/2022 19:26    MR ANGIO HEAD WO CONTRAST  Result Date: 07/19/2022 CLINICAL DATA:  Acute neuro deficit. Dizziness and left leg weakness. EXAM: MRI HEAD WITHOUT AND WITH CONTRAST MRA HEAD WITHOUT CONTRAST TECHNIQUE: Multiplanar, multi-echo pulse sequences of the brain and surrounding structures were acquired without and with intravenous contrast. Angiographic images of the Circle of Willis were acquired using MRA technique without intravenous contrast. CONTRAST:  8.20m GADAVIST GADOBUTROL 1 MMOL/ML IV SOLN COMPARISON:  CT head 07/19/2022 FINDINGS: MRI HEAD FINDINGS Brain: Image quality degraded by moderate motion and artifact. Small acute infarct in the right centrum semiovale. No other acute infarct Ventricle size normal. Chronic infarct right thalamus. Chronic microvascular ischemic changes in the white matter. Chronic microhemorrhage right posterior temporal lobe. No mass lesion Normal enhancement postcontrast administration. Image quality degraded by motion. Vascular: Normal arterial flow voids. Skull and upper cervical spine: No focal skeletal lesion. Sinuses/Orbits: Mucosal edema paranasal sinuses. Bilateral cataract extraction Other: None MRA HEAD FINDINGS Anterior circulation: Internal carotid artery patent bilaterally. Anterior and middle cerebral arteries patent. There is decreased signal in the right MCA at the bifurcation likely due to tortuosity. Distal right MCA branches appear patent Posterior circulation: Both vertebral arteries patent to the basilar. Left vertebral artery dominant. Basilar patent. Decreased signal in the posterior cerebral arteries bilaterally likely due to tortuosity. Anatomic variants: None Image quality degraded by moderate motion IMPRESSION: 1. Small acute infarct right centrum semiovale 2. 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 Motion degraded study of the brain. 3. MRA is limited by vessel tortuosity and motion. No large  vessel occlusion identified Electronically Signed   By: CFranchot GalloM.D.   On: 07/19/2022 19:26   CT HEAD WO CONTRAST  Result Date: 07/19/2022 CLINICAL DATA:  Dizziness and left leg weakness EXAM: CT HEAD WITHOUT CONTRAST TECHNIQUE: Contiguous axial images were obtained from the base of the skull through the vertex without intravenous contrast. RADIATION DOSE REDUCTION: This exam was performed according to the departmental dose-optimization program which includes automated exposure control, adjustment of the mA and/or kV according to patient size and/or use of iterative reconstruction technique. COMPARISON:  None Available. FINDINGS: Brain: There is hypodensity in the right thalamic capsular region consistent with age-indeterminate but possibly acute or subacute infarct. There is no evidence of large vessel territorial infarct. There is no acute intracranial hemorrhage or extra-axial fluid collection. There are remote appearing infarcts in the left cerebral hemisphere white matter and mild background chronic small vessel ischemic change. Parenchymal volume is normal for age. The ventricles are normal in size. There is no mass lesion.  There is no mass effect or midline shift. Vascular: There is calcification of the bilateral carotid siphons and vertebral arteries. Skull: Normal. Negative for fracture or focal lesion. Sinuses/Orbits: There is mild mucosal thickening in the paranasal sinuses. Bilateral lens implants are in place. The globes and orbits are otherwise unremarkable. Other: None. IMPRESSION: 1. Small focus of hypodensity in the right thalamic capsular region is age indeterminate but could  reflect a small acute or subacute infarct. Consider brain MRI for further evaluation. 2. No evidence of large vessel territorial infarct or hemorrhage. Electronically Signed   By: Valetta Mole M.D.   On: 07/19/2022 14:56     Discharge Medications:  Allergies as of 07/24/2022   No Known Allergies       Medication List     TAKE these medications    aspirin EC 81 MG tablet Take 1 tablet (81 mg total) by mouth daily. Swallow whole.   clopidogrel 75 MG tablet Commonly known as: PLAVIX Take 1 tablet (75 mg total) by mouth daily.   losartan 50 MG tablet Commonly known as: COZAAR Take 1 tablet (50 mg total) by mouth at bedtime.   rosuvastatin 20 MG tablet Commonly known as: CRESTOR Take 1 tablet (20 mg total) by mouth daily.        Discharge Instructions: Please refer to Patient Instructions section of EMR for full details.  Patient was counseled important signs and symptoms that should prompt return to medical care, changes in medications, dietary instructions, activity restrictions, and follow up appointments.   Follow-Up Appointments:  Follow-up Information     [facility]. Schedule an appointment as soon as possible for a visit in 1 month(s).   Specialty: Neurology Why: stroke clinic Contact information: [place] [phone]                Erskine Emery, MD 07/24/2022, 10:41 AM PGY-2, [place]

## 2022-07-24 NOTE — Progress Notes (Signed)
FMTS Interim Progress Note  Late Entry:  This morning around 10am, patient reported to the nurse that he was feeling short of breath and had some chest pressure overnight, which was not mentioned to any prior provider. Patient was well-appearing this morning. Patient was evaluated by team member and noted to not be in any respiratory or other acute distress. EKG, troponin, and CXR were obtained. Troponin reassuring at 6, EKG with sinus rhythm without ST elevations, and CXR clear of disease.   Consideration that symptoms could have been related to reflux or anxiety. Do not acute cardiac or pulmonary cause that would have contributed to patient's symptoms. Do recommend that if patient has further episodes, he would need to be re-evaluated.    Dione Mccombie, DO

## 2022-07-24 NOTE — Plan of Care (Signed)

## 2022-07-24 NOTE — Progress Notes (Signed)
Physical Therapy Treatment Patient Details Name: Daniel [name] MRN: [id number] DOB: Mar 31, 1946 Today's Date: 07/24/2022   History of Present Illness 76 y.o. 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  PMH: HTN and hernia repair.    PT Comments    Despite extension of R thalamic infarction pt with great effort and motivation to improve. Pt now with dense L UE paresis with L LE weakness. Pt did begin to demo increased quad strength with exercises and standing tolerance. Pt with strong L lateral lean but able to self correct but unable to maintain. Pt to greatly benefit from AIR upon d/c to maximize functional return for safe transition home with daughter.    Recommendations for follow up therapy are one component of a multi-disciplinary discharge planning process, led by the attending physician.  Recommendations may be updated based on patient status, additional functional criteria and insurance authorization.  Follow Up Recommendations  Acute inpatient rehab (3hours/day)     Assistance Recommended at Discharge Frequent or constant Supervision/Assistance  Patient can return home with the following A lot of help with walking and/or transfers;A lot of help with bathing/dressing/bathroom;Direct supervision/assist for medications management;Help with stairs or ramp for entrance;Assist for transportation;Assistance with cooking/housework   Equipment Recommendations  Rolling walker (2 wheels)    Recommendations for Other Services Rehab consult     Precautions / Restrictions Precautions Precautions: Fall Restrictions Weight Bearing Restrictions: No     Mobility  Bed Mobility Overal bed mobility: Needs Assistance Bed Mobility: Supine to Sit     Supine to sit: Mod assist     General bed mobility comments: max directional verbal cues to reach across to L bedrail with R UE and bring self to  EOB, modA for L LE mangement of EOB, minA for trunk elevation    Transfers Overall transfer level: Needs assistance Equipment used: 2 person hand held assist Transfers: Sit to/from Stand, Bed to chair/wheelchair/BSC Sit to Stand: [name] assist, +2 physical assistance, +2 safety/equipment   Step pivot transfers: Mod assist, +2 safety/equipment, +2 physical assistance       General transfer comment: pt with good power up however required L knee to be blocked initially due to poor prioception, L LE weakness and flexion. Pt able to activate terminal knee extension with verbal cues but unable to maintain > 5 sec, maxA to advance L LE during stepping sequence to the chair, pt unable to use L UE functionally during transfer    Ambulation/Gait                   Stairs             Wheelchair Mobility    Modified Rankin (Stroke Patients Only) Modified Rankin (Stroke Patients Only) Pre-Morbid Rankin Score: No significant disability Modified Rankin: Moderately severe disability     Balance Overall balance assessment: Needs assistance, History of Falls Sitting-balance support: Feet supported Sitting balance-Leahy Scale: Poor Sitting balance - Comments: initially with R lateral lean however with max verbal cues able to self correct but unable to maintain midliine posture >30 sec Postural control: Right lateral lean Standing balance support: Bilateral upper extremity supported, During functional activity Standing balance-Leahy Scale: Zero Standing balance comment: maxA+2 to maintain standing balance                            Cognition Arousal/Alertness:  Awake/alert Behavior During Therapy: WFL for tasks assessed/performed Overall Cognitive Status: Impaired/Different from baseline Area of Impairment: Safety/judgement, Problem solving                   Current Attention Level: Selective   Following Commands: Follows one step commands with increased time,  Follows one step commands consistently   Awareness: Emergent, Anticipatory Problem Solving: Slow processing, Difficulty sequencing General Comments: pt aware that he has no use of L UE and very weak in L LE, pt with good command follow        Exercises General Exercises - Lower Extremity Quad Sets: AROM, Left, 10 reps, Supine Long Arc Quad: AAROM, Left, 5 reps, Seated    General Comments General comments (skin integrity, edema, etc.): mild L UE swelling      Pertinent Vitals/Pain Pain Assessment Pain Assessment: No/denies pain    Home Living                          Prior Function            PT Goals (current goals can now be found in the care plan section) Acute Rehab PT Goals Patient Stated Goal: to go home PT Goal Formulation: With patient/family Time For Goal Achievement: 08/03/22 Potential to Achieve Goals: Good Progress towards PT goals: Progressing toward goals    Frequency    [name] 4X/week      PT Plan Current plan remains appropriate    Co-evaluation              AM-PAC PT "6 Clicks" Mobility   Outcome Measure  Help needed turning from your back to your side while in a flat bed without using bedrails?: A Lot Help needed moving from lying on your back to sitting on the side of a flat bed without using bedrails?: A Lot Help needed moving to and from a bed to a chair (including a wheelchair)?: A Lot Help needed standing up from a chair using your arms (e.g., wheelchair or bedside chair)?: A Lot Help needed to walk in hospital room?: Total Help needed climbing 3-5 steps with a railing? : Total 6 Click Score: 10    End of Session Equipment Utilized During Treatment: Gait belt Activity Tolerance: Patient tolerated treatment well Patient left: in chair;with call bell/phone within reach;with chair alarm set;with family/visitor present Nurse Communication: Mobility status PT Visit Diagnosis: Other abnormalities of gait and mobility  (R26.89);History of falling (Z91.81);Ataxic gait (R26.0)     Time: 7124-5809 PT Time Calculation ([name]) (ACUTE ONLY): 37 [name]  Charges:  $Therapeutic Exercise: 8-22 mins $Neuromuscular Re-education: 8-22 mins                     Kittie Plater, PT, DPT Acute Rehabilitation Services Secure chat preferred Office #: [phone]    Berline Lopes 07/24/2022, 2:38 PM

## 2022-07-24 NOTE — H&P (Signed)
Physical Medicine and Rehabilitation Admission H&P    Chief Complaint  Patient presents with   Dizziness  : HPI: Daniel [name] is a 76 year old right-handed male with history of hypertension as well as former tobacco use.  Per chart review patient lives with his daughter.  1 level home 3 steps to entry.  Independent driving prior to admission.  Presented 07/19/2022 with left-sided weakness and dizziness of acute onset.  No nausea vomiting or fever reported.  Blood pressure 205/120.  CT/MRI showed small acute infarct right centrum semiovale.  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  MRA showed no large vessel occlusion.  Admission chemistry unremarkable except glucose 103, alcohol negative, urine drug screen negative.  Echocardiogram with ejection fraction of 55 to 60% no wall motion abnormalities grade 1 diastolic dysfunction.  Neurology follow-up placed on aspirin as well as Plavix for CVA prophylaxis.  Hospital course increasing left-sided weakness 07/23/2022 with MRI completed showing increased size of acute infarct involving the right corona radiata and basal ganglia.  MRI findings discussed with neurology no specific changes recommended and remains on low-dose aspirin with Plavix x3 weeks followed by aspirin alone.  Placed on Lovenox for DVT prophylaxis.  Monitoring of permissive hypertension patient on losartan 50 mg prior to admission.  Tolerating a regular consistency diet.  Patient with nonspecific chest pain while eating breakfast troponin negative EKG mild sinus bradycardia.  Chest x-ray NAD.  Therapy evaluations completed due to patient's left-sided weakness and decreased functional mobility was admitted for a comprehensive rehab program   Pt reports LUE/LLE very weak- can barely move.  Only has swallowing issue with dry foods- when doesn't drink enough.  Just ate lunch- no issues there.  LBM yesterday Peeing OK with urinal.    Review of Systems  Constitutional:  Negative for chills and fever.  HENT:  Negative for hearing loss.   Eyes:  Negative for blurred vision and double vision.  Respiratory:  Negative for cough and shortness of breath.   Cardiovascular:  Negative for chest pain, palpitations and leg swelling.  Gastrointestinal:  Positive for constipation and heartburn. Negative for nausea and vomiting.  Genitourinary:  Negative for dysuria, flank pain and hematuria.  Musculoskeletal:  Positive for myalgias.  Skin:  Negative for rash.  Neurological:  Positive for dizziness and weakness.  All other systems reviewed and are negative.  Past Medical History:  Diagnosis Date   Hernia of fascia 2008   repair with many complications    Hypertension    Past Surgical History:  Procedure Laterality Date   APPENDECTOMY     HERNIA REPAIR     Family History  Problem Relation Age of Onset   Hypertension Mother    Osteoporosis Mother    Thyroid disease Mother    Alcohol abuse Father    Depression Sister    Hypertension Sister    High Cholesterol Sister    Hypertension Brother    Social History:  reports that he has quit smoking. He has never used smokeless tobacco. He reports that he does not drink alcohol and does not use drugs. Allergies: No Known Allergies Medications Prior to Admission  Medication Sig Dispense Refill   losartan (COZAAR) 50 MG tablet Take 1 tablet (50 mg total) by mouth at bedtime. 90 tablet 3      Home: Home Living Family/patient expects to be discharged to:: Private residence Living Arrangements: Children Available Help at Discharge: Family, Available 24 hours/day  Type of Home: House Home Access: Stairs to enter [employer] of Steps: 3 Entrance Stairs-Rails: Right, Left Home Layout: One level Bathroom Shower/Tub: Multimedia programmer: Handicapped height Bathroom Accessibility: Yes Home Equipment: [place] - single point Additional Comments: lives with  daughter who works only one day a week and 76 y/o adopted daughter  Lives With: Daughter   Functional History: Prior Function Prior Level of Function : Independent/Modified Independent, Driving 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, drives, 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, no injury and self recovery ADLs Comments: Pt reports independent in ADL and IADL  Functional Status:  Mobility: Bed Mobility Overal bed mobility: Needs Assistance Bed Mobility: Supine to Sit Supine to sit: Min assist, Mod assist Sit to supine: Min guard General bed mobility comments: MinA for trunk elevation but immediately requiring at least modA to maintain sitting balance due to falling to R side Transfers Overall transfer level: Needs assistance Equipment used: 2 person hand held assist Transfers: Sit to/from Stand, Bed to chair/wheelchair/BSC Sit to Stand: Min assist, +2 physical assistance, +2 safety/equipment Bed to/from chair/wheelchair/BSC transfer type:: Step pivot Step pivot transfers: Mod assist, +2 safety/equipment, +2 physical assistance General transfer comment: assist to power up and steady. Able to take steps towards BSC on L side but alternating L knee buckling and hyperextension Ambulation/Gait Ambulation/Gait assistance: Max assist, +2 physical assistance, +2 safety/equipment Gait Distance (Feet): 15 Feet Assistive device: 2 person hand held assist Gait Pattern/deviations: Step-to pattern, Decreased step length - right, Decreased stride length, Knee hyperextension - left, Knees buckling, Ataxic General Gait Details: unable to utilize RW due to L hand/UE weakness and inability to grip RW. Utilized HHAx2. Patient requiring maxA+2 for balance and tactile cues/assist at L knee to limit hyperextension. Ataxic throughout with little awareness of deficits and amount of assist required. Gait velocity: decreased Gait velocity interpretation: <1.31 ft/sec, indicative of  household ambulator Stairs: Yes Stairs assistance: Mod assist Stair Management: Two rails, Step to pattern, Forwards Number of Stairs: 2 General stair comments: using rails and difficulty with stepping ataxic quality and high risk for falls unaided    ADL: ADL Overall ADL's : Needs assistance/impaired Eating/Feeding: Set up, Bed level Grooming: Moderate assistance, Standing Grooming Details (indicate cue type and reason): Up to mod A for standing balance with RW. Would anticipate need for mod A for balance during bil grooming tasks. Upper Body Bathing: Min guard, Sitting Lower Body Bathing: Sit to/from stand, Minimal assistance Upper Body Dressing : Minimal assistance, Sitting Upper Body Dressing Details (indicate cue type and reason): balance Lower Body Dressing: Maximal assistance, +2 for physical assistance, +2 for safety/equipment Lower Body Dressing Details (indicate cue type and reason): Pt requiring max A to don underwear sitting at bedside commode this session. Using RUE to pick up LLE to step into underwear. Cues for sequencing and problem solving throughout. Toilet Transfer: Maximal assistance, +2 for physical assistance, +2 for safety/equipment, Stand-pivot, BSC/3in1 Toilet Transfer Details (indicate cue type and reason): Pt transferring to [facility] in room with max A +2. LLE blocking of buckling and of hyperextension at knee. Toileting- Clothing Manipulation and Hygiene: Maximal assistance, +2 for physical assistance, +2 for safety/equipment, Sit to/from stand Toileting - Clothing Manipulation Details (indicate cue type and reason): Max A for standing balance. Functional mobility during ADLs: Maximal assistance, +2 for physical assistance, +2 for safety/equipment General ADL Comments: Max A for functional mobility from [facility] around EOB and to chair. [place] of LLE hyperextension and bucling.  Pt with heavy R lateral lean  Cognition: Cognition Overall Cognitive Status:  Impaired/Different from baseline Arousal/Alertness: Awake/alert Orientation Level: Oriented X4 Attention: Sustained Sustained Attention: Appears intact Memory: Impaired Memory Impairment: Retrieval deficit, Decreased short term memory Awareness: Impaired Awareness Impairment: Emergent impairment Executive Function: Reasoning Reasoning: Impaired Reasoning Impairment: Verbal complex Cognition Arousal/Alertness: Awake/alert Behavior During Therapy: WFL for tasks assessed/performed Overall Cognitive Status: Impaired/Different from baseline Area of Impairment: Attention, Following commands, Safety/judgement, Problem solving, Awareness Current Attention Level: Selective Following Commands: Follows one step commands with increased time, Follows one step commands consistently, Follows multi-step commands inconsistently Safety/Judgement: Decreased awareness of deficits Awareness: Emergent, Anticipatory Problem Solving: Slow processing, Difficulty sequencing General Comments: Demos decreased safety awareness and insight into deficits. Patient acknowledges increased L sided weakness from previous sessions but does not associate with decreased mobility and increased assistance required  Physical Exam: Blood pressure (!) 152/80, pulse (!) 55, temperature 98.6 F (37 C), temperature source Oral, resp. rate 18, height '5\' 8"'$  (1.727 m), SpO2 97 %. Physical Exam Vitals and nursing note reviewed.  Constitutional:      Appearance: Normal appearance. He is normal weight.     Comments: Leaning on R side- awake, alert, making eye contact; appears stated age, NAD HOH esp in L ear  HENT:     Head: Normocephalic and atraumatic.     Comments: L facial droop L tongue deviation at rest Sensation on face equal/intact    Right Ear: External ear normal.     Left Ear: External ear normal.     Nose: Nose normal. No congestion.     Mouth/Throat:     Mouth: Mucous membranes are dry.     Pharynx: Oropharynx is  clear. No oropharyngeal exudate.  Eyes:     General:        Right eye: No discharge.        Left eye: No discharge.     Extraocular Movements: Extraocular movements intact.     Comments: No nystagmus  Cardiovascular:     Rate and Rhythm: Normal rate and regular rhythm.     Heart sounds: Normal heart sounds. No murmur heard.    No gallop.  Pulmonary:     Effort: Pulmonary effort is normal. No respiratory distress.     Breath sounds: Normal breath sounds. No wheezing, rhonchi or rales.  Abdominal:     General: Bowel sounds are normal. There is no distension.     Palpations: Abdomen is soft.     Tenderness: There is no abdominal tenderness.  Musculoskeletal:     Cervical back: Neck supple. No tenderness.     Comments: RUE 5/5 in Biceps, triceps, WE, grip and FA LUE- 1/5 in same muscles RLE- 5/5 in HF.KE. KF. DF and PF LLE- 0-1/5 in same muscles  Skin:    General: Skin is warm and dry.     Comments: No skin breakdown seen  Neurological:     Mental Status: He is alert.     Comments: Patient is alert. Mild dysarthria and intelligible. Makes eye contact with examiner.  Provides name and age.  He  doe show some STM recall deficits.  Follows simple commands. Leaning on R side in bed Intact to light touch in all 4 extremities  Psychiatric:        Mood and Affect: Mood normal.        Behavior: Behavior normal.     Results for orders placed or performed during the hospital encounter of 07/19/22 (from the  past 48 hour(s))  CBC     Status: None   Collection Time: 07/23/22  3:52 AM  Result Value Ref Range   WBC 9.0 4.0 - 10.5 K/uL   RBC 4.69 4.22 - 5.81 MIL/uL   Hemoglobin 13.4 13.0 - 17.0 g/dL   HCT 40.3 39.0 - 52.0 %   MCV 85.9 80.0 - 100.0 fL   MCH 28.6 26.0 - 34.0 pg   MCHC 33.3 30.0 - 36.0 g/dL   RDW 13.2 11.5 - 15.5 %   Platelets 235 150 - 400 K/uL   nRBC 0.0 0.0 - 0.2 %    Comment: Performed at [facility], [place] [address] [place], [place] [address]  Basic  metabolic panel     Status: Abnormal   Collection Time: 07/23/22  3:52 AM  Result Value Ref Range   Sodium 137 135 - 145 mmol/L   Potassium 4.5 3.5 - 5.1 mmol/L   Chloride 104 98 - 111 mmol/L   CO2 26 22 - 32 mmol/L   Glucose, Bld 121 (H) 70 - 99 mg/dL    Comment: Glucose reference range applies only to samples taken after fasting for at least 8 hours.   BUN 24 (H) 8 - 23 mg/dL   Creatinine, Ser 1.12 0.61 - 1.24 mg/dL   Calcium 9.4 8.9 - 10.3 mg/dL   GFR, Estimated >60 >60 mL/min    Comment: (NOTE) Calculated using the CKD-EPI Creatinine Equation (2021)    Anion gap 7 5 - 15    Comment: Performed at [facility] [address] [place], [place] [address]   MR BRAIN WO CONTRAST  Result Date: 07/23/2022 CLINICAL DATA:  Neuro deficit, acute, stroke suspected. EXAM: MRI HEAD WITHOUT CONTRAST TECHNIQUE: Multiplanar, multiecho pulse sequences of the brain and surrounding structures were obtained without intravenous contrast. COMPARISON:  Head CT 07/22/2022 and MRI 07/19/2022 FINDINGS: Brain: There is an acute infarct involving the posterior right corona radiata and extending into the posterior aspect of the right lentiform nucleus which has substantially enlarged from the prior MRI (now 3 cm in AP dimension). There is no associated hemorrhage. T2 hyperintensities elsewhere in the cerebral white matter bilaterally are unchanged and nonspecific but compatible with mild chronic small vessel ischemic disease. Chronic lacunar infarcts are again noted in the left parietal white matter and right thalamus. No mass, midline shift, or extra-axial fluid collection is identified. Mild cerebral atrophy is within normal limits for age. A few scattered chronic cerebral microhemorrhages are again seen. Vascular: Absent flow void in the distal right vertebral artery which reflects a change from the prior study, with the vessel appearing small but patent on the prior MRA. Other major intracranial vascular flow  voids are preserved. Skull and upper cervical spine: Unremarkable bone marrow signal. Sinuses/Orbits: Bilateral cataract extraction. Mild mucosal thickening in the paranasal sinuses. Clear mastoid air cells. Other: None. IMPRESSION: 1. Increased size of acute infarct involving the right corona radiata and basal ganglia. 2. 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 3. Newly abnormal appearance of the nondominant distal right vertebral artery which may reflect slow flow or occlusion. Electronically Signed   By: Logan Bores M.D.   On: 07/23/2022 14:51   CT Head Wo Contrast  Result Date: 07/22/2022 CLINICAL DATA:  Follow-up examination for stroke. EXAM: CT HEAD WITHOUT CONTRAST TECHNIQUE: Contiguous axial images were obtained from the base of the skull through the vertex without intravenous contrast. RADIATION DOSE REDUCTION: This exam  was performed according to the departmental dose-optimization program which includes automated exposure control, adjustment of the mA and/or kV according to patient size and/or use of iterative reconstruction technique. COMPARISON:  Prior MRI from 07/19/2022. FINDINGS: Brain: Age-related cerebral atrophy with chronic small vessel ischemic disease. Remote lacunar infarct noted at the right thalamus. Continued normal interval evolutionary changes noted about the previously identified infarct involving the right frontal corona radiata/centrum semi ovale, relatively similar in size as compared to prior MRI. No regional mass effect or associated hemorrhage. No other visible acute large vessel territory infarct. No intracranial hemorrhage. No mass lesion or midline shift. No hydrocephalus or extra-axial fluid collection. Vascular: No hyperdense vessel. Calcified atherosclerosis present at skull base. Skull: Scalp soft tissues and calvarium demonstrate no acute finding. Sinuses/Orbits: Globes normal soft tissues within normal limits. Scattered  mucoperiosteal thickening present about the sphenoethmoidal and maxillary sinuses. Mastoid air cells remain clear. Other: None. IMPRESSION: 1. Continued normal interval evolutionary changes about the previously identified infarct involving the right frontal corona radiata/centrum semi ovale, relatively similar in size as compared to prior MRI. No regional mass effect or associated hemorrhage. 2. No other new acute intracranial abnormality. 3. Age-related cerebral atrophy with chronic small vessel ischemic disease. Electronically Signed   By: Jeannine Boga M.D.   On: 07/22/2022 23:17      Blood pressure (!) 152/80, pulse (!) 55, temperature 98.6 F (37 C), temperature source Oral, resp. rate 18, height '5\' 8"'$  (1.727 m), SpO2 97 %.  Medical Problem List and Plan: 1. Functional deficits secondary to right CR infarction with progressive left-sided weakness  -patient may  shower  -ELOS/Goals: 8-10 days supervision 2.  Antithrombotics: -DVT/anticoagulation:  Pharmaceutical: Lovenox  -antiplatelet therapy: Aspirin 81 mg daily and Plavix 75 mg day x3 weeks (end 9/22) then aspirin alone 3. Pain Management: Tylenol as needed 4. Mood/Behavior/Sleep: Provide emotional support  -antipsychotic agents: N/A 5. Neuropsych/cognition: This patient is capable of making decisions on his own behalf. 6. Skin/Wound Care: Routine skin checks 7. Fluids/Electrolytes/Nutrition: Routine in and outs with follow-up chemistries 8.  Permissive hypertension.  Losartan 50 mg nightly resumed 07/24/2022 9.  Hyperlipidemia.  Crestor  I have personally performed a face to face diagnostic evaluation of this patient and formulated the key components of the plan.  Additionally, I have personally reviewed laboratory data, imaging studies, as well as relevant notes and concur with the physician assistant's documentation above.   The patient's status has not changed from the original H&P.  Any changes in documentation from the  acute care chart have been noted above.      Lavon Paganini Angiulli, PA-C 07/24/2022

## 2022-07-25 ENCOUNTER — Encounter (HOSPITAL_COMMUNITY): Payer: Self-pay | Admitting: Physical Medicine & Rehabilitation

## 2022-07-25 DIAGNOSIS — I6381 Other cerebral infarction due to occlusion or stenosis of small artery: Secondary | ICD-10-CM

## 2022-07-25 LAB — COMPREHENSIVE METABOLIC PANEL
ALT: 16 U/L (ref 0–44)
AST: 15 U/L (ref 15–41)
Albumin: 3.5 g/dL (ref 3.5–5.0)
Alkaline Phosphatase: 82 U/L (ref 38–126)
Anion gap: 9 (ref 5–15)
BUN: 23 mg/dL (ref 8–23)
CO2: 27 mmol/L (ref 22–32)
Calcium: 9.2 mg/dL (ref 8.9–10.3)
Chloride: 101 mmol/L (ref 98–111)
Creatinine, Ser: 1.09 mg/dL (ref 0.61–1.24)
GFR, Estimated: >60 mL/min (ref >60)
Glucose, Bld: 115 mg/dL — ABNORMAL HIGH (ref 70–99)
Potassium: 4 mmol/L (ref 3.5–5.1)
Sodium: 137 mmol/L (ref 135–145)
Total Bilirubin: 1.3 mg/dL — ABNORMAL HIGH (ref 0.3–1.2)
Total Protein: 6.5 g/dL (ref 6.5–8.1)

## 2022-07-25 LAB — CBC WITH DIFFERENTIAL/PLATELET
Abs Immature Granulocytes: 0.02 10*3/uL (ref 0.00–0.07)
Basophils Absolute: 0 10*3/uL (ref 0.0–0.1)
Basophils Relative: 0 %
Eosinophils Absolute: 0.3 10*3/uL (ref 0.0–0.5)
Eosinophils Relative: 3 %
HCT: 40.2 % (ref 39.0–52.0)
Hemoglobin: 13.5 g/dL (ref 13.0–17.0)
Immature Granulocytes: 0 %
Lymphocytes Relative: 18 %
Lymphs Abs: 1.6 10*3/uL (ref 0.7–4.0)
MCH: 28.8 pg (ref 26.0–34.0)
MCHC: 33.6 g/dL (ref 30.0–36.0)
MCV: 85.7 fL (ref 80.0–100.0)
Monocytes Absolute: 0.7 10*3/uL (ref 0.1–1.0)
Monocytes Relative: 8 %
Neutro Abs: 6 10*3/uL (ref 1.7–7.7)
Neutrophils Relative %: 71 %
Platelets: 226 10*3/uL (ref 150–400)
RBC: 4.69 MIL/uL (ref 4.22–5.81)
RDW: 12.9 % (ref 11.5–15.5)
WBC: 8.5 10*3/uL (ref 4.0–10.5)
nRBC: 0 % (ref 0.0–0.2)

## 2022-07-25 MED ORDER — TRAZODONE HCL 50 MG PO TABS
50.0000 mg | ORAL_TABLET | Freq: Every day | ORAL | Status: DC
  Administered 2022-07-25 – 2022-08-04 (×11): 50 mg via ORAL
  Filled 2022-07-25 (×11): qty 1

## 2022-07-25 NOTE — Patient Care Conference (Signed)
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 Alysia Penna Social Worker Present: Erlene Quan, BSW Nurse Present: Dorien Chihuahua, RN PT Present: Page Spiro, PT OT Present: Willeen Cass, OT;Mary Jabier Gauss, COTA SLP Present: Sherren Kerns, SLP PPS Coordinator present : Gunnar Fusi, SLP     Current Status/Progress Goal Weekly Team Focus  Bowel/Bladder   pt is continent of b/b. LBM 9/2  remain continent of b/b  Assist with toileting qshift and prn   Swallow/Nutrition/ Hydration             ADL's   eval pending         Mobility   eval pending         Communication   eval pending         Safety/Cognition/ Behavioral Observations  eval pending         Pain   no c/o pain  remain with no c/o pain  Assess pain qshift   Skin   Skin intact. scabbed abrasions on hands and arms. ecchymosis bilateral on legs and arms  Keep skin intact and free from skin breakdown  Assess skin qshift     Discharge Planning:  Discharging home with assistance from daughters and spouse. MIN A, 24/7. Family working on ramp.   Team Discussion: Patient post right corona radiata CVA; HTN meds restarted. Left wrist pain addressed Patient on target to meet rehab goals: Evals pending  *See Care Plan and progress  notes for long and short-term goals.   Revisions to Treatment Plan:  Left wrist splint   Teaching Needs: Safety, transfers, toileting, medications, dietary modifications, etc.   Current Barriers to Discharge: Decreased caregiver support and Home enviroment access/layout  Possible Resolutions to Barriers: Family education     Medical Summary Current Status: Left pure motor stroke, DNR, PT/OT / SLP evals pending             I attest that I was present, lead the team conference, and concur with the assessment and plan of the team.   Dorien Chihuahua B 07/25/2022, 1:35 PM

## 2022-07-25 NOTE — Progress Notes (Signed)
[facility] Individual Statement of Services  Patient Name:  BOYSIE BONEBRAKE  Date:  07/25/2022  Welcome to the [facility].  Our goal is to provide you with an individualized program based on your diagnosis and situation, designed to meet your specific needs.  With this comprehensive rehabilitation program, you will be expected to participate in at least 3 hours of rehabilitation therapies Monday-Friday, with modified therapy programming on the weekends.  Your rehabilitation program will include the following services:  Physical Therapy (PT), Occupational Therapy (OT), Speech Therapy (ST), 24 hour per day rehabilitation nursing, Therapeutic Recreaction (TR), Neuropsychology, Care Coordinator, Rehabilitation Medicine, Nutrition Services, Pharmacy Services, and Other  Weekly team conferences will be held on Wednesdays to discuss your progress.  Your Inpatient Rehabilitation Care Coordinator will talk with you frequently to get your input and to update you on team discussions.  Team conferences with you and your family in attendance may also be held.  Expected length of stay: 8-10 Days  Overall anticipated outcome: Supervision  Depending on your progress and recovery, your program may change. Your Inpatient Rehabilitation Care Coordinator will coordinate services and will keep you informed of any changes. Your Inpatient Rehabilitation Care Coordinator's name and contact numbers are listed  below.  The following services may also be recommended but are not provided by the [facility]:   [place] will be made to provide these services after discharge if needed.  Arrangements include referral to agencies that provide these services.  Your insurance has been verified to be:  [employer] Your primary doctor is:  Talbert Cage, MD  Pertinent information  will be shared with your doctor and your insurance company.  Inpatient Rehabilitation Care Coordinator:  Erlene Quan, [place] or [phone]  Information discussed with and copy given to patient by: Dyanne Iha, 07/25/2022, 9:24 AM

## 2022-07-25 NOTE — Plan of Care (Signed)
  Problem: RH Swallowing Goal: LTG Patient will consume least restrictive diet using compensatory strategies with assistance (SLP) Description: LTG:  Patient will consume least restrictive diet using compensatory strategies with assistance (SLP) Flowsheets (Taken 07/25/2022 1619) LTG: Pt Patient will consume least restrictive diet using compensatory strategies with assistance of (SLP): Supervision Goal: LTG Pt will demonstrate functional change in swallow as evidenced by bedside/clinical objective assessment (SLP) Description: LTG: Patient will demonstrate functional change in swallow as evidenced by bedside/clinical objective assessment (SLP) Flowsheets (Taken 07/25/2022 1619) LTG: Patient will demonstrate functional change in swallow as evidenced by bedside/clinical objective assessment: Oropharyngeal swallow   Problem: RH Expression Communication Goal: LTG Patient will increase speech intelligibility (SLP) Description: LTG: Patient will increase speech intelligibility at word/phrase/conversation level with cues, % of the time (SLP) Flowsheets (Taken 07/25/2022 1619) LTG: Patient will increase speech intelligibility (SLP): Supervision

## 2022-07-25 NOTE — Plan of Care (Signed)
Problem: RH Balance Goal: LTG: Patient will maintain dynamic sitting balance (OT) Description: LTG:  Patient will maintain dynamic sitting balance with assistance during activities of daily living (OT) Flowsheets (Taken 07/25/2022 1540) LTG: Pt will maintain dynamic sitting balance during ADLs with: Supervision/Verbal cueing Goal: LTG Patient will maintain dynamic standing with ADLs (OT) Description: LTG:  Patient will maintain dynamic standing balance with assist during activities of daily living (OT)  Flowsheets (Taken 07/25/2022 1540) LTG: Pt will maintain dynamic standing balance during ADLs with: Contact Guard/Touching assist   Problem: Sit to Stand Goal: LTG:  Patient will perform sit to stand in prep for activites of daily living with assistance level (OT) Description: LTG:  Patient will perform sit to stand in prep for activites of daily living with assistance level (OT) Flowsheets (Taken 07/25/2022 1540) LTG: PT will perform sit to stand in prep for activites of daily living with assistance level: Contact Guard/Touching assist   Problem: RH Eating Goal: LTG Patient will perform eating w/assist, cues/equip (OT) Description: LTG: Patient will perform eating with assist, with/without cues using equipment (OT) Flowsheets (Taken 07/25/2022 1540) LTG: Pt will perform eating with assistance level of: Set up assist    Problem: RH Grooming Goal: LTG Patient will perform grooming w/assist,cues/equip (OT) Description: LTG: Patient will perform grooming with assist, with/without cues using equipment (OT) Flowsheets (Taken 07/25/2022 1540) LTG: Pt will perform grooming with assistance level of: Supervision/Verbal cueing   Problem: RH Bathing Goal: LTG Patient will bathe all body parts with assist levels (OT) Description: LTG: Patient will bathe all body parts with assist levels (OT) Flowsheets (Taken 07/25/2022 1540) LTG: Pt will perform bathing with assistance level/cueing: Minimal Assistance -  Patient > 75% LTG: Position pt will perform bathing: Shower   Problem: RH Dressing Goal: LTG Patient will perform upper body dressing (OT) Description: LTG Patient will perform upper body dressing with assist, with/without cues (OT). Flowsheets (Taken 07/25/2022 1540) LTG: Pt will perform upper body dressing with assistance level of: Supervision/Verbal cueing Goal: LTG Patient will perform lower body dressing w/assist (OT) Description: LTG: Patient will perform lower body dressing with assist, with/without cues in positioning using equipment (OT) Flowsheets (Taken 07/25/2022 1540) LTG: Pt will perform lower body dressing with assistance level of: Minimal Assistance - Patient > 75%   Problem: RH Toileting Goal: LTG Patient will perform toileting task (3/3 steps) with assistance level (OT) Description: LTG: Patient will perform toileting task (3/3 steps) with assistance level (OT)  Flowsheets (Taken 07/25/2022 1540) LTG: Pt will perform toileting task (3/3 steps) with assistance level: Minimal Assistance - Patient > 75%   Problem: RH Functional Use of Upper Extremity Goal: LTG Patient will use RT/LT upper extremity as a (OT) Description: LTG: Patient will use right/left upper extremity as a stabilizer/gross assist/diminished/nondominant/dominant level with assist, with/without cues during functional activity (OT) Flowsheets (Taken 07/25/2022 1540) LTG: Use of upper extremity in functional activities: LUE as a stabilizer LTG: Pt will use upper extremity in functional activity with assistance level of: Minimal Assistance - Patient > 75%   Problem: RH Toilet Transfers Goal: LTG Patient will perform toilet transfers w/assist (OT) Description: LTG: Patient will perform toilet transfers with assist, with/without cues using equipment (OT) Flowsheets (Taken 07/25/2022 1540) LTG: Pt will perform toilet transfers with assistance level of: Contact Guard/Touching assist   Problem: RH Tub/Shower  Transfers Goal: LTG Patient will perform tub/shower transfers w/assist (OT) Description: LTG: Patient will perform tub/shower transfers with assist, with/without cues using equipment (OT) Flowsheets (Taken 07/25/2022  1540) LTG: Pt will perform tub/shower stall transfers with assistance level of: Minimal Assistance - Patient > 75% LTG: Pt will perform tub/shower transfers from:  Tub/shower combination  Walk in shower   Problem: RH Attention Goal: LTG Patient will demonstrate this level of attention during functional activites (OT) Description: LTG:  Patient will demonstrate this level of attention during functional activites  (OT) Flowsheets (Taken 07/25/2022 1540) Patient will demonstrate this level of attention during functional activites: Selective Patient will demonstrate above attention level in the following environment: Home LTG: Patient will demonstrate this level of attention during functional activites (OT): Minimal Assistance - Patient > 75%   Problem: RH Awareness Goal: LTG: Patient will demonstrate awareness during functional activites type of (OT) Description: LTG: Patient will demonstrate awareness during functional activites type of (OT) Flowsheets (Taken 07/25/2022 1540) Patient will demonstrate awareness during functional activites type of: Emergent LTG: Patient will demonstrate awareness during functional activites type of (OT): Minimal Assistance - Patient > 75%

## 2022-07-25 NOTE — Progress Notes (Deleted)
[facility] Individual Statement of Services  Patient Name:  Daniel [name]  Date:  07/25/2022  Welcome to the [facility].  Our goal is to provide you with an individualized program based on your diagnosis and situation, designed to meet your specific needs.  With this comprehensive rehabilitation program, you will be expected to participate in at least 3 hours of rehabilitation therapies Monday-Friday, with modified therapy programming on the weekends.  Your rehabilitation program will include the following services:  Physical Therapy (PT), Occupational Therapy (OT), Speech Therapy (ST), 24 hour per day rehabilitation nursing, Therapeutic Recreaction (TR), Neuropsychology, Care Coordinator, Rehabilitation Medicine, Nutrition Services, Pharmacy Services, and Other  Weekly team conferences will be held on Wednesdays to discuss your progress.  Your Inpatient Rehabilitation Care Coordinator will talk with you frequently to get your input and to update you on team discussions.  Team conferences with you and your family in attendance may also be held.  Expected length of stay: 12-14 Days  Overall anticipated outcome: Supervision to MOD I W/C Level  Depending on your progress and recovery, your program may change. Your Inpatient Rehabilitation Care Coordinator will coordinate services and will keep you informed of any changes. Your Inpatient Rehabilitation Care Coordinator's name and contact numbers are listed  below.  The following services may also be recommended but are not provided by the [facility]:   [place] will be made to provide these services after discharge if needed.  Arrangements include referral to agencies that provide these services.  Your insurance has been verified to be:  [employer]'s Comp Your primary doctor is:  Talbert Cage,  MD  Pertinent information will be shared with your doctor and your insurance company.  Inpatient Rehabilitation Care Coordinator:  Erlene Quan, [place] or [phone]  Information discussed with and copy given to patient by: Dyanne Iha, 07/25/2022, 9:06 AM

## 2022-07-25 NOTE — Progress Notes (Signed)
PROGRESS NOTE   Subjective/Complaints:  Discussed CVA , and limitations with pt and daughter, pt has some L wrist pain this am, no trauma to area    ROS- neg CP, SOB, N/V/D  Objective:   DG CHEST PORT 1 VIEW  Result Date: 07/24/2022 CLINICAL DATA:  Shortness of breath, dysphagia EXAM: PORTABLE CHEST 1 VIEW COMPARISON:  12/14/2006 FINDINGS: The heart size and mediastinal contours are within normal limits. Both lungs are clear. The visualized skeletal structures are unremarkable. IMPRESSION: No acute abnormality of the lungs in AP portable projection. Electronically Signed   By: Delanna Ahmadi M.D.   On: 07/24/2022 13:33   MR BRAIN WO CONTRAST  Result Date: 07/23/2022 CLINICAL DATA:  Neuro deficit, acute, stroke suspected. EXAM: MRI HEAD WITHOUT CONTRAST TECHNIQUE: Multiplanar, multiecho pulse sequences of the brain and surrounding structures were obtained without intravenous contrast. COMPARISON:  Head CT 07/22/2022 and MRI 07/19/2022 FINDINGS: Brain: There is an acute infarct involving the posterior right corona radiata and extending into the posterior aspect of the right lentiform nucleus which has substantially enlarged from the prior MRI (now 3 cm in AP dimension). There is no associated hemorrhage. T2 hyperintensities elsewhere in the cerebral white matter bilaterally are unchanged and nonspecific but compatible with mild chronic small vessel ischemic disease. Chronic lacunar infarcts are again noted in the left parietal white matter and right thalamus. No mass, midline shift, or extra-axial fluid collection is identified. Mild cerebral atrophy is within normal limits for age. A few scattered chronic cerebral microhemorrhages are again seen. Vascular: Absent flow void in the distal right vertebral artery which reflects a change from the prior study, with the vessel appearing small but patent on the prior MRA. Other major intracranial  vascular flow voids are preserved. Skull and upper cervical spine: Unremarkable bone marrow signal. Sinuses/Orbits: Bilateral cataract extraction. Mild mucosal thickening in the paranasal sinuses. Clear mastoid air cells. Other: None. IMPRESSION: 1. Increased size of acute infarct involving the right corona radiata and basal ganglia. 2. 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 3. Newly abnormal appearance of the nondominant distal right vertebral artery which may reflect slow flow or occlusion. Electronically Signed   By: Logan Bores M.D.   On: 07/23/2022 14:51   Recent Labs    07/23/22 0352 07/25/22 0651  WBC 9.0 8.5  HGB 13.4 13.5  HCT 40.3 40.2  PLT 235 226   Recent Labs    07/23/22 0352 07/25/22 0651  NA 137 137  K 4.5 4.0  CL 104 101  CO2 26 27  GLUCOSE 121* 115*  BUN 24* 23  CREATININE 1.12 1.09  CALCIUM 9.4 9.2    Intake/Output Summary (Last 24 hours) at 07/25/2022 0841 Last data filed at 07/25/2022 0700 Gross per 24 hour  Intake 75 ml  Output 300 ml  Net -225 ml        Physical Exam: Vital Signs Blood pressure (!) 145/78, pulse 63, temperature 98.3 F (36.8 C), temperature source Oral, resp. rate 17, height '5\' 10"'$  (1.778 m), weight 81.5 kg, SpO2 98 %.  General: No acute distress Mood and affect are appropriate Heart: Regular rate and rhythm no  rubs murmurs or extra sounds Lungs: Clear to auscultation, breathing unlabored, no rales or wheezes Abdomen: Positive bowel sounds, soft nontender to palpation, nondistended Extremities: No clubbing, cyanosis, or edema Skin: No evidence of breakdown, no evidence of rash Neurologic: Cranial nerves II through XII intact, motor strength is 5/5 in RIght deltoid, bicep, tricep, grip, hip flexor, knee extensors, ankle dorsiflexor and plantar flexor 2- triceps o/w 0/5 LUE, 3- HF and KE on Left o/w 0/5  Sensory exam normal sensation to light touch and proprioception in bilateral upper and lower  extremities  Musculoskeletal: Full range of motion in all 4 extremities. No joint swelling, no pain with ROM L wrist no effusion , erythema     Assessment/Plan: 1. Functional deficits which require 3+ hours per day of interdisciplinary therapy in a comprehensive inpatient rehab setting. Physiatrist is providing close team supervision and 24 hour management of active medical problems listed below. Physiatrist and rehab team continue to assess barriers to discharge/monitor patient progress toward functional and medical goals  Care Tool:  Bathing              Bathing assist       Upper Body Dressing/Undressing Upper body dressing        Upper body assist      Lower Body Dressing/Undressing Lower body dressing            Lower body assist       Toileting Toileting    Toileting assist Assist for toileting: Dependent - Patient 0%     Transfers Chair/bed transfer  Transfers assist  Chair/bed transfer activity did not occur: Safety/medical concerns        Locomotion Ambulation   Ambulation assist              Walk 10 feet activity   Assist           Walk 50 feet activity   Assist           Walk 150 feet activity   Assist           Walk 10 feet on uneven surface  activity   Assist           Wheelchair     Assist               Wheelchair 50 feet with 2 turns activity    Assist            Wheelchair 150 feet activity     Assist          Blood pressure (!) 145/78, pulse 63, temperature 98.3 F (36.8 C), temperature source Oral, resp. rate 17, height '5\' 10"'$  (1.778 m), weight 81.5 kg, SpO2 98 %.  Medical Problem List and Plan: 1. 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  Antithrombotics: -DVT/anticoagulation:  Pharmaceutical: Lovenox              -antiplatelet therapy: Aspirin 81 mg daily and Plavix 75 mg day x3 weeks (end 9/22) then aspirin alone 3. Pain Management: Tylenol as needed 4. Mood/Behavior/Sleep: Provide emotional support             -antipsychotic agents: N/A 5. Neuropsych/cognition: This patient is capable of making decisions on his own behalf. 6. Skin/Wound Care: Routine skin checks 7.  Fluids/Electrolytes/Nutrition: Routine in and outs with follow-up chemistries 8.  Permissive hypertension.  Losartan 50 mg nightly resumed 07/24/2022 Vitals:   07/24/22 2244  BP: (!) 145/78  Pulse: 63  Resp: 17  Temp: 98.3 F (36.8 C)  SpO2: 98%    9.  Hyperlipidemia.  Crestor    LOS: 1 days A FACE TO FACE EVALUATION WAS PERFORMED  Charlett Blake 07/25/2022, 8:41 AM

## 2022-07-25 NOTE — Evaluation (Signed)
Occupational Therapy Assessment and Plan  Patient Details  Name: Daniel [name] MRN: [id number] Date of Birth: 1946/10/27  OT Diagnosis: abnormal posture, acute pain, cognitive deficits, disturbance of vision, flaccid hemiplegia and hemiparesis, hemiplegia affecting non-dominant side, and muscle weakness (generalized) Rehab Potential: Rehab Potential (ACUTE ONLY): Good ELOS: 14-18  days   Today's Date: 07/25/2022 OT Individual Time: 1000-1101 OT Individual Time Calculation (min): 61 min     Hospital Problem: Principal Problem:   CVA (cerebral vascular accident) ([place]) Active Problems:   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 ([place])   Past Medical History:  Past Medical History:  Diagnosis Date   Hernia of fascia 2008   repair with many complications    Hypertension    Past Surgical History:  Past Surgical History:  Procedure Laterality Date   APPENDECTOMY     HERNIA REPAIR      Assessment & Plan Clinical Impression: Patient is a 76 y.o. year old right-handed male with history of hypertension as well as former tobacco use.  Patient lives with his daughter.  1 level home 3 steps to entry.  Independent driving prior to admission.  Presented 07/19/2022 with left-sided weakness and dizziness of acute onset.  No nausea vomiting or fever reported.  Blood pressure 205/120.  CT/MRI showed small acute infarct right centrum semiovale.  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  MRA showed no large vessel occlusion.  Admission chemistry unremarkable except glucose 103, alcohol negative, urine drug screen negative.  Echocardiogram with ejection fraction of 55 to 60% no wall motion abnormalities grade 1 diastolic dysfunction.  Neurology follow-up placed on aspirin as well as Plavix for CVA prophylaxis.  Hospital course increasing left-sided weakness 07/23/2022 with MRI completed showing increased size of acute infarct involving  the right corona radiata and basal ganglia.  MRI findings discussed with neurology no specific changes recommended and remains on low-dose aspirin with Plavix x3 weeks followed by aspirin alone.  Placed on Lovenox for DVT prophylaxis.  Monitoring of permissive hypertension patient on losartan 50 mg prior to admission.  Tolerating a regular consistency diet.  Patient with nonspecific chest pain while eating breakfast troponin negative EKG mild sinus bradycardia.  Chest x-ray NAD.  Patient transferred to CIR on 07/24/2022 .    Patient currently requires max with basic self-care skills secondary to abnormal tone, unbalanced muscle activation, and decreased coordination, decreased midline orientation and decreased attention to left, decreased attention, decreased awareness, decreased problem solving, decreased safety awareness, and decreased memory, and decreased sitting balance, decreased standing balance, decreased postural control, hemiplegia, and decreased balance strategies.  Prior to hospitalization, patient could complete BADL with independent .  Patient will benefit from skilled intervention to decrease level of assist with basic self-care skills and increase independence with basic self-care skills prior to discharge home with care partner.  Anticipate patient will require minimal physical assistance and follow up home health.  OT - End of Session Activity Tolerance: Tolerates 10 - 20 min activity with multiple rests Endurance Deficit: Yes OT Assessment Rehab Potential (ACUTE ONLY): Good OT Patient demonstrates impairments in the following area(s): Balance;Behavior;Pain;Perception;Cognition;Safety;Endurance;Sensory;Motor OT Basic ADL's Functional Problem(s): Eating;Grooming;Bathing;Dressing;Toileting OT Transfers Functional Problem(s): Toilet;Tub/Shower OT Additional Impairment(s): Fuctional Use of Upper Extremity OT Plan OT Intensity: Minimum of 1-2 x/day, 45 to 90 minutes OT Frequency: 5 out of  7 days OT Duration/Estimated Length of Stay: 14-18  days OT Treatment/Interventions: Balance/vestibular training;Functional electrical stimulation;Neuromuscular re-education;Patient/family education;Self Care/advanced ADL retraining;Splinting/orthotics;Therapeutic Exercise;UE/LE  Coordination activities;Wheelchair propulsion/positioning;UE/LE Strength taining/ROM;Therapeutic Activities;Functional mobility training;DME/adaptive equipment instruction;Discharge planning;Cognitive remediation/compensation OT Self Feeding Anticipated Outcome(s): min assist OT Basic Self-Care Anticipated Outcome(s): Min assist OT Toileting Anticipated Outcome(s): Min assist OT Bathroom Transfers Anticipated Outcome(s): Min assist OT Recommendation Patient destination: Home Follow Up Recommendations: Home health OT Equipment Recommended: To be determined Equipment Details: need to establish what equipment currently has   OT Evaluation Precautions/Restrictions  Precautions Precautions: Fall Precaution Comments: hemiplegia, poor postural control Restrictions Weight Bearing Restrictions: No General   Vital Signs Therapy Vitals Temp: 98.7 F (37.1 C) Temp Source: Oral Pulse Rate: 93 Resp: 18 BP: 125/84 Patient Position (if appropriate): Lying Oxygen Therapy SpO2: 95 % O2 Device: Room Air Pain Pain Assessment Pain Scale: 0-10 Pain Score: Asleep Home Living/Prior Functioning Home Living Living Arrangements: Children (Lives with daughter) Available Help at Discharge: Family, Available 24 hours/day Type of Home: House Home Access: Stairs to enter Technical brewer of Steps: 3 Entrance Stairs-Rails: Right, Left Home Layout: One level Bathroom Shower/Tub: Multimedia programmer: Handicapped height Bathroom Accessibility: Yes Additional Comments: lives with daughter who works only one day a week and 76 y/o adopted daughter  Lives With: Daughter Youth worker) IADL History 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 Prior Function Level of Independence: Independent with basic ADLs Vision Baseline Vision/History: 1 Wears glasses (For driving) Ability to See in Adequate Light: 0 Adequate Patient Visual Report: Other (comment) (reports decreased acuity) Vision Assessment?: Vision impaired- to be further tested in functional context Additional Comments: reports decreased acutiy - "think I need glasses now" Perception  Perception: Impaired Inattention/Neglect: Does not attend to left side of body Praxis Praxis: Intact Cognition Cognition Overall Cognitive Status: Impaired/Different from baseline Arousal/Alertness: Awake/alert Orientation Level: Person;Place;Situation Person: Oriented Place: Oriented Situation: Oriented Memory: Impaired Memory Impairment: Retrieval deficit;Decreased short term memory Decreased Short Term Memory: Functional basic Attention: Sustained Sustained Attention: Impaired Sustained Attention Impairment: Functional basic Awareness: Impaired Awareness Impairment: Intellectual impairment Problem Solving: Impaired Problem Solving Impairment: Functional basic Executive Function: Organizing;Self Monitoring;Self Correcting Reasoning: Impaired Reasoning Impairment: Functional basic Behaviors: Impulsive Safety/Judgment: Impaired Brief Interview for Mental Status (BIMS) Repetition of Three Words (First Attempt): 3 Temporal Orientation: Year: Correct Temporal Orientation: Month: Accurate within 5 days Temporal Orientation: Day: Correct Recall: "Sock": Yes, after cueing ("something to wear") Recall: "Blue": Yes, no cue required Recall: "Bed": Yes, no cue required BIMS Summary Score: 14 Sensation Sensation Light Touch: Impaired by gross assessment (long term sensory loss left thumb) Hot/Cold: Appears Intact Proprioception: Impaired by gross assessment Stereognosis: Not tested Coordination Gross Motor Movements are Fluid and Coordinated: No Fine  Motor Movements are Fluid and Coordinated: No 9 Hole Peg Test: Dense L hemiplegia Motor  Motor Motor: Hemiplegia;Abnormal tone;Motor impersistence;Abnormal postural alignment and control Motor - Skilled Clinical Observations: strong posterior and leftward lean in sitting.  Trunk/Postural Assessment  Cervical Assessment Cervical Assessment: Exceptions to [facility] (forward head position) Thoracic Assessment Thoracic Assessment: Exceptions to [facility] (flattened anterior chest wall, left trunk shortening) Lumbar Assessment Lumbar Assessment: Exceptions to [facility] (posterior pelvis rotation) Postural Control Postural Control: Deficits on evaluation Righting Reactions: Severely delayed left, premature foward Protective Responses: absent to left  Balance Balance Balance Assessed: Yes Static Sitting Balance Static Sitting - Balance Support: No upper extremity supported;Feet supported Static Sitting - Level of Assistance: 3: Mod assist Dynamic Sitting Balance Dynamic Sitting - Balance Support: No upper extremity supported;Feet supported;During functional activity Dynamic Sitting - Level of Assistance: 3: Mod assist Dynamic Sitting - Balance Activities: Forward lean/weight shifting;Reaching for  objects;Lateral lean/weight shifting Sitting balance - Comments: Strong leftward loss of balance.  Maintains mass behind base of support at edge of bed, and in wheelchair Static Standing Balance Static Standing - Balance Support: Right upper extremity supported;During functional activity Static Standing - Level of Assistance: 2: Max assist Static Standing - Comment/# of Minutes: 10 sec Dynamic Standing Balance Dynamic Standing - Balance Support: Right upper extremity supported;During functional activity Dynamic Standing - Level of Assistance: 2: Max assist Dynamic Standing - Balance Activities: Reaching for objects;Forward lean/weight shifting Dynamic Standing - Comments: Needs facilitation to align  LLE. Extremity/Trunk Assessment RUE Assessment RUE Assessment: Within Functional Limits LUE Assessment LUE Assessment: Exceptions to [facility] Passive Range of Motion (PROM) Comments: Has passive motion without discomfort to ~ 80* flecion at shoulder Active Range of Motion (AROM) Comments: Has trace active scap elevation, forearm pronation LUE Body System: Neuro Brunstrum levels for arm and hand: Arm;Hand Brunstrum level for arm: Stage I Presynergy Brunstrum level for hand: Stage I Flaccidity  Care Tool Care Tool Self Care Eating   Eating Assist Level: Minimal Assistance - Patient > 75%    Oral Care    Oral Care Assist Level: Moderate Assistance - Patient 50 - 74%    Bathing   Body parts bathed by patient: Chest;Abdomen;Front perineal area;Right upper leg;Left upper leg;Face Body parts bathed by helper: Buttocks;Left arm;Right arm;Right lower leg;Left lower leg   Assist Level: Maximal Assistance - Patient 24 - 49%    Upper Body Dressing(including orthotics)       Assist Level: Moderate Assistance - Patient 50 - 74%    Lower Body Dressing (excluding footwear)   What is the patient wearing?: Underwear/pull up;Pants Assist for lower body dressing: Maximal Assistance - Patient 25 - 49%    Putting on/Taking off footwear   What is the patient wearing?: Non-skid slipper socks Assist for footwear: Maximal Assistance - Patient 25 - 49%       Care Tool Toileting Toileting activity   Assist for toileting: Moderate Assistance - Patient 50 - 74% Assistive Device Comment: male urinal   Care Tool Bed Mobility Roll left and right activity   Roll left and right assist level: Moderate Assistance - Patient 50 - 74%    Sit to lying activity   Sit to lying assist level: Moderate Assistance - Patient 50 - 74%    Lying to sitting on side of bed activity   Lying to sitting on side of bed assist level: the ability to move from lying on the back to sitting on the side of the bed with no back  support.: Moderate Assistance - Patient 50 - 74%     Care Tool Transfers Sit to stand transfer   Sit to stand assist level: Moderate Assistance - Patient 50 - 74%    Chair/bed transfer   Chair/bed transfer assist level: Moderate Assistance - Patient 50 - 74%     Toilet transfer Toilet transfer activity did not occur: N/A Assist Level: Moderate Assistance - Patient 50 - 74%     Care Tool Cognition  Expression of Ideas and Wants Expression of Ideas and Wants: 4. Without difficulty (complex and basic) - expresses complex messages without difficulty and with speech that is clear and easy to understand  Understanding Verbal and Non-Verbal Content Understanding Verbal and Non-Verbal Content: 3. Usually understands - understands most conversations, but misses some part/intent of message. Requires cues at times to understand   Memory/Recall Ability Memory/Recall Ability : That he or she  is in a hospital/hospital unit;Current season   Refer to Care Plan for Long Term Goals  SHORT TERM GOAL WEEK 1 OT Short Term Goal 1 (Week 1): Patient will sit at edge of bed with only intermittent min assist in preparation for a level surface transfer to wheelchair or commode OT Short Term Goal 2 (Week 1): Patient will don pull over shirt with min assist and verbal cueing after set up OT Short Term Goal 3 (Week 1): Patient will don pants over left foot with mod assistance OT Short Term Goal 4 (Week 1): Patient will maintain static sittin gbalance while on commode with only intermittent min assistance OT Short Term Goal 5 (Week 1): Patient will bathe upper body with min assist  Recommendations for other services: None    Skilled Therapeutic Intervention Patient received supine in bed, daughter, Glenard Haring, initially at bedside, however took the opportunity to go to work while patient in therapy.  Daughter to bring in clothes that patient would normally wear.  Daughter confirms that she and patient live together,  and patient indicates that he has an adopted 3 yr old daughter, Elyse Hsu.   Patient with strong posterior left lean when seated at edge of bed nad in wheelchair.  Mod assist transfer from bed to wheelchair.  Patient needs assistance to align LLE for all transitions.  ADL's as noted below.   Patient returned to bed at end of session.  Felt unsafe to be left in wheelchair for 3 hours until next session.   Bed alarm engaged, and call bell, personal items within reach.    ADL Grooming: Minimal assistance Where Assessed-Grooming: Sitting at sink Upper Body Bathing: Moderate assistance Where Assessed-Upper Body Bathing: Sitting at sink Lower Body Bathing: Maximal assistance Where Assessed-Lower Body Bathing: Sitting at sink;Standing at sink Upper Body Dressing: Moderate assistance Where Assessed-Upper Body Dressing: Sitting at sink Lower Body Dressing: Maximal assistance Where Assessed-Lower Body Dressing: Sitting at sink;Standing at sink Toileting: Unable to assess (able to request and use urinal with min assist) Where Assessed-Toileting: Other (Comment) (seated in wheelchair) Toilet Transfer: Moderate assistance Toilet Transfer Method: Squat pivot Toilet Transfer Equipment: Drop arm bedside commode Tub/Shower Transfer: Unable to assess Tub/Shower Transfer Method: Unable to assess Tub/Shower Equipment: Other (comment) (TBD) Walk-In Banker: Unable to assess Social research officer, government Method: Unable to assess [employer]: Other (comment) (TBD) Mobility  Bed Mobility Bed Mobility: Rolling Right;Supine to Sit Rolling Right: Moderate Assistance - Patient 50-74% Supine to Sit: Moderate Assistance - Patient 50-74%   Discharge Criteria: Patient will be discharged from OT if patient refuses treatment 3 consecutive times without medical reason, if treatment goals not met, if there is a change in medical status, if patient makes no progress towards goals or if patient is discharged  from hospital.  The above assessment, treatment plan, treatment alternatives and goals were discussed and mutually agreed upon: by patient  Mariah Milling 07/25/2022, 3:39 PM

## 2022-07-25 NOTE — Evaluation (Signed)
Speech Language Pathology Assessment and Plan  Patient Details  Name: Daniel [name] MRN: [id number] Date of Birth: [birth year]  SLP Diagnosis: Cognitive Impairments;Dysarthria;Dysphagia;Speech and Language deficits  Rehab Potential: Good ELOS: 14-18 days   Today's Date: 07/25/2022 SLP Individual Time: 1400-1500 SLP Individual Time Calculation (min): 60 min  Hospital Problem: Principal Problem:   CVA (cerebral vascular accident) ([place]) Active Problems:   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 ([place])  Past Medical History:  Past Medical History:  Diagnosis Date   Hernia of fascia 2008   repair with many complications    Hypertension    Past Surgical History:  Past Surgical History:  Procedure Laterality Date   APPENDECTOMY     HERNIA REPAIR      Assessment / Plan / Recommendation Clinical Impression Daniel [name] is a 76 year old right-handed male with history of hypertension as well as former tobacco use.  Per chart review patient lives with his daughter.  1 level home 3 steps to entry.  Independent driving prior to admission.  Presented 07/19/2022 with left-sided weakness and dizziness of acute onset. Blood pressure 205/120. CT/MRI showed small acute infarct right centrum semiovale.  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  MRA showed no large vessel occlusion. Neurology follow-up placed on aspirin as well as Plavix for CVA prophylaxis.  Hospital course increasing left-sided weakness 07/23/2022 with MRI completed showing increased size of acute infarct involving the right corona radiata and basal ganglia. Tolerating a regular consistency diet.  Patient with nonspecific chest pain while eating breakfast troponin negative EKG mild sinus bradycardia.  Chest x-ray NAD. Therapy evaluations completed due to patient's left-sided weakness and decreased functional mobility was admitted for a comprehensive rehab  program.  Clinical bedside swallow:  Pt presents with mild oral dysphagia characterized by left facial and lingual weakness and very mild oral residuals post initial swallows. Pt did not present with any s/sx concerning for pharyngeal dysphagia among all tested consistencies. Pt exhibited belching upon thin liquid sips. There is no documented history of esophageal dysphagia.   Pt reported acute changes to swallowing however vague with symptoms. Per further interview, pt denied pharyngeal symptoms and complained mainly of oral residuals. Pt reported preference to consume soft foods at this time - ordered mashed potatoes, pudding, and applesauce for lunch today. Pt consumed pureed and dysphagia 2 & 3 textures with adequate oral prep despite edentulous status, what appeared to be timely swallow response, minimal oral and buccal residuals, and without presentation of pharyngeal symptoms. With dysphagia 2/3 textures, pt reported "I can't swallow it" prior to initiating pharyngeal swallow, and referenced bolus feeling "stuck" in mouth. With encouragement, pt swallowed without visible difficulty and cleared from oral cavity with only slight residuals. Pt eventually declined further trials due to concern bolus would become stuck in oral cavity. Suspect there may be anxiety/nervousness component. From a clinical perspective, pt would appropriate for dysphagia 3 vs regular textures and thin liquids. Pt expressed preference for "soft" consistencies at this time. Per pt comfort, plan to initiate a dysphagia 2 diet with thin liquids. Continue medications whole with liquid as tolerated. Will continue trials to assess candidacy and comfort for diet advancement as tolerated.   Speech: Pt presents with mild-to-moderate dysarthria characterized by articulatory imprecision at the word level secondary to left facial deficits. Pt was perceived as ~50% intelligible at the sentence level prior to implementation of any speech  strategies. Pt acknowledged speech deficits but with minimal  self monitoring skills or repair. Significant hearing loss may contribute to reduced awareness.   Language: Expressive/receptive language skills appear intact for all tasks assessed. Interfering components for comprehension include hearing loss.   Cognitive-linguistic: Suspect deficits with higher level problem solving, awareness, and reasoning. Pt was alert and oriented x4. Appeared aware of physical impairments, however questionable emergent awareness. Further testing recommended and unable to be completed this date 2/2 time constraints.  Recommend skilled ST intervention to address cognitive-linguistic skills, speech, and swallowing deficits to maximize pt's functional independence prior to discharge.    Skilled Therapeutic Interventions          Speech, language, cognitive-linguistic, and swallowing assessments completed. Please see above.  SLP Assessment  Patient will need skilled Speech Lanaguage Pathology Services during CIR admission    Recommendations  SLP Diet Recommendations: Dysphagia 2 (Fine chop);Thin Liquid Administration via: Cup;Straw Medication Administration: Whole meds with liquid Supervision: Patient able to self feed;Intermittent supervision to cue for compensatory strategies Compensations: Minimize environmental distractions;Slow rate;Small sips/bites;Lingual sweep for clearance of pocketing Postural Changes and/or Swallow Maneuvers: Seated upright 90 degrees Oral Care Recommendations: Oral care BID Patient destination: Home Follow up Recommendations: Other (comment) (TBD) Equipment Recommended: None recommended by SLP    SLP Frequency 3 to 5 out of 7 days   SLP Duration  SLP Intensity  SLP Treatment/Interventions 14-18 days  Minumum of 1-2 x/day, 30 to 90 minutes  Cognitive remediation/compensation;Internal/external aids;Speech/Language facilitation;Functional tasks;Dysphagia/aspiration precaution  training;Patient/family education;Therapeutic Activities;Therapeutic Exercise    Pain Pain Assessment Pain Scale: 0-10 Pain Score: Asleep  Prior Functioning Cognitive/Linguistic Baseline: Information not available Type of Home: House  Lives With: Daughter Available Help at Discharge: Family;Available 24 hours/day Vocation: Retired  SLP Evaluation Cognition Overall Cognitive Status: Impaired/Different from baseline Arousal/Alertness: Awake/alert Orientation Level: Oriented X4 Year: 2023 Month: September Day of Week: Correct Attention: Sustained;Focused Focused Attention: Appears intact Sustained Attention: Impaired Sustained Attention Impairment: Functional basic;Verbal basic Memory: Impaired Memory Impairment: Retrieval deficit;Decreased short term memory Decreased Short Term Memory: Functional basic Awareness: Impaired Awareness Impairment: Intellectual impairment;Emergent impairment Problem Solving: Impaired Problem Solving Impairment: Functional basic Executive Function: Organizing;Self Monitoring;Self Correcting Reasoning: Impaired Reasoning Impairment: Functional basic Behaviors: Impulsive Safety/Judgment: Impaired  Comprehension Auditory Comprehension Overall Auditory Comprehension: Appears within functional limits for tasks assessed Interfering Components: Hearing Expression Expression Primary Mode of Expression: Verbal Verbal Expression Overall Verbal Expression: Appears within functional limits for tasks assessed Written Expression Dominant Hand: Right Oral Motor Oral Motor/Sensory Function Overall Oral Motor/Sensory Function: Mild impairment Facial ROM: Suspected CN VII (facial) dysfunction Facial Symmetry: Suspected CN VII (facial) dysfunction;Abnormal symmetry left Facial Strength: Suspected CN VII (facial) dysfunction Lingual ROM: Within Functional Limits Lingual Symmetry: Abnormal symmetry left;Suspected CN XII (hypoglossal) dysfunction Lingual  Strength: Reduced Motor Speech Overall Motor Speech: Impaired Respiration: Within functional limits Phonation: Low vocal intensity Resonance: Within functional limits Articulation: Impaired Level of Impairment: Word Intelligibility: Intelligibility reduced Word: 75-100% accurate Phrase: 50-74% accurate Sentence: 50-74% accurate Conversation: Not tested Motor Planning: Witnin functional limits Interfering Components: Hearing loss  Care Tool Care Tool Cognition Ability to hear (with hearing aid or hearing appliances if normally used Ability to hear (with hearing aid or hearing appliances if normally used): 2. Moderate difficulty - speaker has to increase volume and speak distinctly   Expression of Ideas and Wants Expression of Ideas and Wants: 3. Some difficulty - exhibits some difficulty with expressing needs and ideas (e.g, some words or finishing thoughts) or speech is not clear   Understanding Verbal and Non-Verbal Content   Understanding Verbal and Non-Verbal Content: 3. Usually understands - understands most conversations, but misses some part/intent of message. Requires cues at times to understand  Memory/Recall Ability Memory/Recall Ability : That he or she is in a hospital/hospital unit;Current season   PMSV Assessment  PMSV Trial Intelligibility: Intelligibility reduced Word: 75-100% accurate Phrase: 50-74% accurate Sentence: 50-74% accurate Conversation: Not tested  Bedside Swallowing Assessment General Date of Onset: 07/19/22 Previous Swallow Assessment: 06/23/2022 Diet Prior to this Study: Regular;Thin liquids Temperature Spikes Noted: No Respiratory Status: Room air History of Recent Intubation: No Behavior/Cognition: Alert;Cooperative;Pleasant mood Oral Cavity - Dentition: Edentulous Self-Feeding Abilities: Able to feed self Vision: Functional for self-feeding Patient Positioning: Upright in bed Baseline Vocal Quality: Low vocal intensity Volitional Cough:  Strong Volitional Swallow: Able to elicit  Oral Care Assessment Lips: Asymmetrical Teeth: Missing (Comment) Tongue: Pink Mucous Membrane(s): Moist Saliva: Moist, saliva free flowing Level of Consciousness: Alert Is patient on any of following O2 devices?: None of the above Nutritional status: Dysphagia Oral Assessment Risk : High Risk Ice Chips Ice chips: Not tested Thin Liquid Thin Liquid: Within functional limits Presentation: Straw Nectar Thick Nectar Thick Liquid: Not tested Honey Thick Honey Thick Liquid: Not tested Puree Puree: Within functional limits Solid Solid: Impaired Oral Phase Functional Implications: Oral residue BSE Assessment Suspected Esophageal Findings Suspected Esophageal Findings: Belching Risk for Aspiration Impact on safety and function: Mild aspiration risk Other Related Risk Factors: Cognitive impairment  Short Term Goals: Week 1: SLP Short Term Goal 1 (Week 1): Patient will consume current diet with minimal s/sx of aspiration and min A for implementation of safe swallowing precautions and strategies SLP Short Term Goal 2 (Week 1): Patient will implement speech intelligiblity strategies at the sentence level with min A in order to achieve >75% intelligibility SLP Short Term Goal 3 (Week 1): Patient will participate in further cognitive-linguistic evaluation for further development of ST POC  Refer to Care Plan for Long Term Goals  Recommendations for other services: None   Discharge Criteria: Patient will be discharged from SLP if patient refuses treatment 3 consecutive times without medical reason, if treatment goals not met, if there is a change in medical status, if patient makes no progress towards goals or if patient is discharged from hospital.  The above assessment, treatment plan, treatment alternatives and goals were discussed and mutually agreed upon: by patient  Patty Sermons 07/25/2022, 4:18 PM

## 2022-07-25 NOTE — Progress Notes (Signed)
Inpatient Rehabilitation Care Coordinator Assessment and Plan Patient Details  Name: Daniel [name] MRN: [id number] Date of Birth: December 21, 1945  Today's Date: 07/25/2022  Hospital Problems: Principal Problem:   CVA (cerebral vascular accident) [facility]) Active Problems:   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 ([place])  Past Medical History:  Past Medical History:  Diagnosis Date   Hernia of fascia 2008   repair with many complications    Hypertension    Past Surgical History:  Past Surgical History:  Procedure Laterality Date   APPENDECTOMY     HERNIA REPAIR     Social History:  reports that he has quit smoking. He has never used smokeless tobacco. He reports that he does not drink alcohol and does not use drugs.  Family / Support Systems Marital Status: Single Spouse/Significant Other: Beth Children: Photographer (Daughter) and Golden (Son) Anticipated Caregiver: daughter Levada Dy) Ability/Limitations of Caregiver: Min A; daughter works 1 day a week Careers adviser: 24/7 Family Dynamics: Support from daughter  Social History Preferred language: English Religion: [place] - How often do you need to have someone help you when you read instructions, pamphlets, or other written material from your doctor or pharmacy?: Never Writes: Yes   Abuse/Neglect Abuse/Neglect Assessment Can Be Completed: Yes Physical Abuse: Denies Verbal Abuse: Denies Sexual Abuse: Denies Exploitation of patient/patient's resources: Denies Self-Neglect: Denies  Patient response to: Social Isolation - How often do you feel lonely or isolated from those around you?: Never  Emotional Status Recent Psychosocial Issues: coping Psychiatric History: n/a Substance Abuse History: Tobacco use  Patient / Family Perceptions, Expectations & Goals Pt/Family understanding of illness & functional limitations: yes Premorbid pt/family roles/activities: Previously independent  and driving Anticipated changes in roles/activities/participation: daughter able to provide some assistance with tasks Pt/family expectations/goals: [place]: None Premorbid Home Care/DME Agencies: Other (Comment) ([facility]) Transportation available at discharge: daughter able to transport Is the patient able to respond to transportation needs?: Yes In the past 12 months, has lack of transportation kept you from medical appointments or from getting medications?: No In the past 12 months, has lack of transportation kept you from meetings, work, or from getting things needed for daily living?: No Resource referrals recommended: Neuropsychology  Discharge Planning Living Arrangements: Children (Lives with daughter) Support Systems: Children Type of Residence: Private residence (1 level home, 3 steps to enter) Administrator, sports: Multimedia programmer (specify) Doctor, general practice) Museum/gallery curator Resources: Family Support Financial Screen Referred: No Living Expenses: Lives with family Money Management: Patient, Family Does the patient have any problems obtaining your medications?: No Home Management: Independent Patient/Family Preliminary Plans: [place] able to assist Care Coordinator Barriers to Discharge: Insurance for SNF coverage Care Coordinator Barriers to Discharge Comments: Wound Vac Care Coordinator Anticipated Follow Up Needs: Home Additional Notes/Comments: Family working on ramp Expected length of stay: 8-10 Days  Clinical Impression Sw met with patient, introduced self and explained role. Patient lives with daughter and anticipates returning. No additional questions or concerns.   Dyanne Iha 07/25/2022, 2:11 PM

## 2022-07-25 NOTE — Discharge Instructions (Addendum)
Inpatient Rehab Discharge Instructions  [place] Discharge date and time: No discharge date for patient encounter.   Activities/Precautions/ Functional Status: Activity: activity as tolerated Diet: regular diet Wound Care: Routine skin checks Functional status:  ___ No restrictions     ___ Walk up steps independently ___ 24/7 supervision/assistance   ___ Walk up steps with assistance ___ Intermittent supervision/assistance  ___ Bathe/dress independently ___ Walk with walker     _x__ Bathe/dress with assistance ___ Walk Independently    ___ Shower independently ___ Walk with assistance    ___ Shower with assistance ___ No alcohol     ___ Return to work/school ________  COMMUNITY REFERRALS UPON DISCHARGE:    Home Health:   PT     OT     ST                     Agency: [facility]  Phone: [phone]    Medical Equipment/Items Ordered: Wheelchair, Laptray, [employer], [employer], Producer, television/film/video, Bedside Commode, Shower Chair                                                 Agency/Supplier: [facility] [phone]   Special Instructions: No driving smoking or alcoh   STROKE/TIA DISCHARGE INSTRUCTIONS SMOKING Cigarette smoking nearly doubles your risk of having a stroke & is the single most alterable risk factor  If you smoke or have smoked in the last 12 months, you are advised to quit smoking for your health. Most of the excess cardiovascular risk related to smoking disappears within a year of stopping. Ask you doctor about anti-smoking medications [place] Quit Line: 1-800-QUIT NOW Free Smoking Cessation Classes [phone]  CHOLESTEROL Know your levels; limit fat & cholesterol in your diet  Lipid Panel     Component Value Date/Time   CHOL 166 07/19/2022 1430   CHOL 166 10/24/2021 1132   TRIG 70 07/19/2022 1430   HDL 65 07/19/2022 1430   HDL 60 10/24/2021 1132   CHOLHDL 2.6 07/19/2022 1430   VLDL 14 07/19/2022 1430   LDLCALC 87 07/19/2022 1430   [place] 91 10/24/2021  1132     Many patients benefit from treatment even if their cholesterol is at goal. Goal: Total Cholesterol (CHOL) less than 160 Goal:  Triglycerides (TRIG) less than 150 Goal:  HDL greater than 40 Goal:  LDL (LDLCALC) less than 100   BLOOD PRESSURE American Stroke Association blood pressure target is less that 120/80 mm/Hg  Your discharge blood pressure is:  BP: (!) 145/78 Monitor your blood pressure Limit your salt and alcohol intake Many individuals will require more than one medication for high blood pressure  DIABETES (A1c is a blood sugar average for last 3 months) Goal HGBA1c is under 7% (HBGA1c is blood sugar average for last 3 months)  Diabetes: No known diagnosis of diabetes    Lab Results  Component Value Date   HGBA1C 6.1 (H) 07/19/2022    Your HGBA1c can be lowered with medications, healthy diet, and exercise. Check your blood sugar as directed by your physician Call your physician if you experience unexplained or low blood sugars.  PHYSICAL ACTIVITY/REHABILITATION Goal is 30 minutes at least 4 days per week  Activity: Increase activity slowly, Therapies: Physical Therapy: Home Health Return to work:  Activity decreases your risk of heart attack and stroke  and makes your heart stronger.  It helps control your weight and blood pressure; helps you relax and can improve your mood. Participate in a regular exercise program. Talk with your doctor about the best form of exercise for you (dancing, walking, swimming, cycling).  DIET/WEIGHT Goal is to maintain a healthy weight  Your discharge diet is:  Diet Order             Diet Heart Room service appropriate? Yes; Fluid consistency: Thin  Diet effective now                   liquids Your height is:  Height: '5\' 10"'$  (177.8 cm) Your current weight is: Weight: 81.5 kg Your Body Mass Index (BMI) is:  BMI (Calculated): 25.78 Following the type of diet specifically designed for you will help prevent another stroke. Your  goal weight range is:   Your goal Body Mass Index (BMI) is 19-24. Healthy food habits can help reduce 3 risk factors for stroke:  High cholesterol, hypertension, and excess weight.  RESOURCES Stroke/Support Group:  Call [phone]   STROKE EDUCATION PROVIDED/REVIEWED AND GIVEN TO PATIENT Stroke warning signs and symptoms How to activate emergency medical system (call 911). Medications prescribed at discharge. Need for follow-up after discharge. Personal risk factors for stroke. Pneumonia vaccine given: No Flu vaccine given: No My questions have been answered, the writing is legible, and I understand these instructions.  I will adhere to these goals & educational materials that have been provided to me after my discharge from the hospital.       My questions have been answered and I understand these instructions. I will adhere to these goals and the provided educational materials after my discharge from the hospital.  Patient/Caregiver Signature _______________________________ Date __________  Clinician Signature _______________________________________ Date __________  Please bring this form and your medication list with you to all your follow-up doctor's appointments.

## 2022-07-25 NOTE — Evaluation (Signed)
Physical Therapy Assessment and Plan  Patient Details  Name: Daniel [name] MRN: [id number] Date of Birth: [birth year]  PT Diagnosis: Abnormal posture, Abnormality of gait, Cognitive deficits, Difficulty walking, Edema, Hemiplegia non-dominant, Hypotonia, Impaired sensation, Muscle weakness, and Pain in R hip Rehab Potential: Good ELOS: ~ 3 weeks   Today's Date: 07/25/2022 PT Individual Time: 2595-6387 PT Individual Time Calculation (min): 36 min    Hospital Problem: Principal Problem:   CVA (cerebral vascular accident) ([place]) Active Problems:   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 ([place])   Past Medical History:  Past Medical History:  Diagnosis Date   Hernia of fascia 2008   repair with many complications    Hypertension    Past Surgical History:  Past Surgical History:  Procedure Laterality Date   APPENDECTOMY     HERNIA REPAIR      Assessment & Plan Clinical Impression: Patient is a 76 y.o. year old  right-handed male with history of hypertension as well as former tobacco use.  Per chart review patient lives with his daughter.  1 level home 3 steps to entry.  Independent driving prior to admission.  Presented 07/19/2022 with left-sided weakness and dizziness of acute onset.  No nausea vomiting or fever reported.  Blood pressure 205/120.  CT/MRI showed small acute infarct right centrum semiovale.  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  MRA showed no large vessel occlusion.  Admission chemistry unremarkable except glucose 103, alcohol negative, urine drug screen negative.  Echocardiogram with ejection fraction of 55 to 60% no wall motion abnormalities grade 1 diastolic dysfunction.  Neurology follow-up placed on aspirin as well as Plavix for CVA prophylaxis.  Hospital course increasing left-sided weakness 07/23/2022 with MRI completed showing increased size of acute infarct involving the right corona radiata  and basal ganglia.  MRI findings discussed with neurology no specific changes recommended and remains on low-dose aspirin with Plavix x3 weeks followed by aspirin alone.  Placed on Lovenox for DVT prophylaxis.  Monitoring of permissive hypertension patient on losartan 50 mg prior to admission.  Tolerating a regular consistency diet.  Patient with nonspecific chest pain while eating breakfast troponin negative EKG mild sinus bradycardia.  Chest x-ray NAD.  Therapy evaluations completed due to patient's left-sided weakness and decreased functional mobility was admitted for a comprehensive rehab program  Patient transferred to CIR on 07/24/2022 .   Patient currently requires max assist with mobility secondary to muscle weakness and muscle paralysis, decreased cardiorespiratoy endurance, impaired timing and sequencing, abnormal tone, unbalanced muscle activation, and decreased motor planning, decreased midline orientation and decreased attention to left, decreased attention, decreased awareness, decreased problem solving, decreased safety awareness, and decreased memory, and decreased sitting balance, decreased standing balance, decreased postural control, hemiplegia, and decreased balance strategies.  Prior to hospitalization, patient was independent  with mobility and lived with Daughter Glenard Haring) in a House home.  Home access is 3Stairs to enter.  Patient will benefit from skilled PT intervention to maximize safe functional mobility, minimize fall risk, and decrease caregiver burden for planned discharge home with 24 hour assist.  Anticipate patient will benefit from follow up OP at discharge.  PT - End of Session Activity Tolerance: Tolerates 30+ min activity with multiple rests Endurance Deficit: Yes Endurance Deficit Description: requires seated rest breaks during functional mobility PT Assessment Rehab Potential (ACUTE/IP ONLY): Good PT Patient demonstrates impairments in the following area(s):  Balance;Safety;Endurance;Motor;Nutrition;Pain;Skin Integrity;Sensory;Edema;Behavior;Perception PT Transfers Functional Problem(s): Bed Mobility;Bed to  Chair;Car;Furniture PT Locomotion Functional Problem(s): Ambulation;Wheelchair Mobility;Stairs PT Plan PT Intensity: Minimum of 1-2 x/day ,45 to 90 minutes PT Frequency: 5 out of 7 days PT Duration Estimated Length of Stay: ~ 3 weeks PT Treatment/Interventions: Ambulation/gait training;Community reintegration;DME/adaptive equipment instruction;Neuromuscular re-education;Psychosocial support;Stair training;UE/LE Strength taining/ROM;Wheelchair propulsion/positioning;Balance/vestibular training;Discharge planning;Functional electrical stimulation;Pain management;Skin care/wound management;Therapeutic Activities;UE/LE Coordination activities;Cognitive remediation/compensation;Disease management/prevention;Functional mobility training;Patient/family education;Splinting/orthotics;Therapeutic Exercise;Visual/perceptual remediation/compensation PT Transfers Anticipated Outcome(s): CGA using LRAD PT Locomotion Anticipated Outcome(s): CGA using LRAD PT Recommendation Recommendations for Other Services: Therapeutic Recreation consult Therapeutic Recreation Interventions: Stress management;Outing/community reintergration Follow Up Recommendations: Outpatient PT;24 hour supervision/assistance;Home health PT (HH vs OP pending progress) Patient destination: Home Equipment Recommended: To be determined   PT Evaluation Precautions/Restrictions Precautions Precautions: Fall;Other (comment) Precaution Comments: L hemiplegia, poor postural control Restrictions Weight Bearing Restrictions: No Pain Pain Assessment Pain Scale: 0-10 Pain Score: 1  (pt states "just a little, nothing to worry about...it's from being in the bed") Pain Type: Acute pain Pain Location: Hip Pain Orientation: Right Pain Descriptors / Indicators: Discomfort;Aching Pain Onset: Other  (Comment) (from prolonged positioning in bed) Pain Intervention(s): Ambulation/increased activity;Repositioned Pain Interference Pain Interference Pain Effect on Sleep: 1. Rarely or not at all Pain Interference with Therapy Activities: 1. Rarely or not at all Pain Interference with Day-to-Day Activities: 1. Rarely or not at all Home Living/Prior [place] Available Help at Discharge: Family;Available 24 hours/day Type of Home: House Home Access: Stairs to enter [employer] of Steps: 3 Entrance Stairs-Rails: Right;Left;Can reach both Home Layout: One level  Lives With: Daughter (2 daughters (11y.o. & 39ish y.o.)) Prior Function Level of Independence: Independent with homemaking with ambulation;Independent with gait;Independent with transfers (performed all outside household tasks independently)  Able to Take Stairs?: Yes Driving: Yes Vocation: Other (Comment) Vocation Requirements: works scrapping metal Vision/Perception  Vision - History Ability to See in Adequate Light: 0 Adequate Perception Perception: Impaired Inattention/Neglect: Does not attend to left side of body (decreased attention to L side of body but attends well to L environment) Praxis Praxis: Impaired Praxis Impairment Details: Motor planning  Cognition Overall Cognitive Status: Impaired/Different from baseline Arousal/Alertness: Awake/alert Orientation Level: Oriented X4 Year: 2023 Month: September Day of Week: Correct Attention: Focused;Sustained;Selective Focused Attention: Appears intact Sustained Attention: Impaired Sustained Attention Impairment: Functional basic;Verbal basic Selective Attention: Impaired Memory: Impaired Awareness: Impaired Behaviors: Impulsive (mildly impulsive) Safety/Judgment: Impaired Sensation Sensation Light Touch: Impaired Detail Central sensation comments: pt reports numbness and tingling in L hemibody but is able to feel light touch in all  points on L LE during assessment Hot/Cold: Not tested Proprioception: Impaired Detail Proprioception Impaired Details: Impaired LLE;Impaired LUE Stereognosis: Not tested Coordination Gross Motor Movements are Fluid and Coordinated: No Coordination and Movement Description: impaired due to dense L hemiplegia (UE more impaired than LE) with impaired core/trunk strength Motor  Motor Motor: Hemiplegia;Abnormal tone;Abnormal postural alignment and control;Motor impersistence Motor - Skilled Clinical Observations: dense L hemiplegia (UE more impaired than LE), hypotonia in L LE and flaccidity in L UE   Trunk/Postural Assessment  Cervical Assessment Cervical Assessment: Exceptions to [facility] (mild forward head position) Thoracic Assessment Thoracic Assessment: Exceptions to [facility] (left trunk shortening) Lumbar Assessment Lumbar Assessment: Exceptions to [facility] (posterior pelvis tilt in sitting) Postural Control Postural Control: Deficits on evaluation Righting Reactions: Severely delayed with left LOB  Balance Balance Balance Assessed: Yes Static Sitting Balance Static Sitting - Balance Support: Feet supported;Right upper extremity supported Static Sitting - Level of Assistance: 4: Min assist Dynamic Sitting Balance Dynamic Sitting - Balance Support:  Feet unsupported;Right upper extremity supported Dynamic Sitting - Level of Assistance: 2: Max assist;3: Mod assist Static Standing Balance Static Standing - Balance Support: Right upper extremity supported;During functional activity Static Standing - Level of Assistance: 2: Max assist Dynamic Standing Balance Dynamic Standing - Balance Support: During functional activity;Right upper extremity supported Dynamic Standing - Level of Assistance: Other (comment);1: +2 Total assist (+2 max assist) Extremity Assessment  RLE Assessment RLE Assessment: Within Functional Limits Active Range of Motion (AROM) Comments: WFL/WNL General Strength Comments:  grossly 5/5 assessed in sitting LLE Assessment LLE Assessment: Exceptions to [facility] Passive Range of Motion (PROM) Comments: [facility] General Strength Comments: assessed in sitting LLE Strength Left Hip Flexion: 2-/5 Left Knee Flexion: 2-/5 Left Knee Extension: 2+/5 Left Ankle Dorsiflexion: 0/5 Left Ankle Plantar Flexion: 0/5 LLE Tone LLE Tone: Moderate;Hypotonic  Care Tool Care Tool Bed Mobility Roll left and right activity   Roll left and right assist level: Moderate Assistance - Patient 50 - 74%    Sit to lying activity   Sit to lying assist level: Moderate Assistance - Patient 50 - 74%    Lying to sitting on side of bed activity   Lying to sitting on side of bed assist level: the ability to move from lying on the back to sitting on the side of the bed with no back support.: Maximal Assistance - Patient 25 - 49%     Care Tool Transfers Sit to stand transfer   Sit to stand assist level: Maximal Assistance - Patient 25 - 49%    Chair/bed transfer   Chair/bed transfer assist level: Maximal Assistance - Patient 25 - 49% (squat pivot)     Mining engineer transfer activity did not occur: Safety/medical concerns        Care Tool Locomotion Ambulation Ambulation activity did not occur: Safety/medical concerns        Walk 10 feet activity Walk 10 feet activity did not occur: Safety/medical concerns       Walk 50 feet with 2 turns activity Walk 50 feet with 2 turns activity did not occur: Safety/medical concerns      Walk 150 feet activity Walk 150 feet activity did not occur: Safety/medical concerns      Walk 10 feet on uneven surfaces activity Walk 10 feet on uneven surfaces activity did not occur: Safety/medical concerns      Stairs Stair activity did not occur: Safety/medical concerns        Walk up/down 1 step activity Walk up/down 1 step or curb (drop down) activity did not occur: Safety/medical concerns      Walk up/down 4 steps activity  Walk up/down 4 steps activity did not occur: Safety/medical concerns      Walk up/down 12 steps activity Walk up/down 12 steps activity did not occur: Safety/medical concerns      Pick up small objects from floor   Pick up small object from the floor assist level: Dependent - Patient 0%    Wheelchair Is the patient using a wheelchair?: Yes Type of Wheelchair: Manual   Wheelchair assist level: Dependent - Patient 0%    Wheel 50 feet with 2 turns activity   Assist Level: Dependent - Patient 0%  Wheel 150 feet activity   Assist Level: Dependent - Patient 0%    Refer to Care Plan for Long Term Goals  SHORT TERM GOAL WEEK 1 PT Short Term Goal 1 (Week 1): Pt will perform supine<>sit  with mod assist consistently PT Short Term Goal 2 (Week 1): Pt will perform sit<>stands using LRAD with mod assist PT Short Term Goal 3 (Week 1): Pt will perform bed<>chair transfers using LRAD with mod assist PT Short Term Goal 4 (Week 1): Pt will ambulate at least 29f with +2 mod assist  Recommendations for other services: Therapeutic Recreation  Stress management and Outing/community reintegration  Skilled Therapeutic Intervention Pt received supine in bed and agreeable to therapy session. Evaluation completed (see details above) with patient education regarding purpose of PT evaluation, PT POC and goals, therapy schedule, weekly team meetings, and other CIR information including safety plan and fall risk safety. Pt performed the below functional mobility tasks with the specified levels of skilled cuing and assistance. Pt with dense L hemiplegia, UE more impaired than LE. Pt demos ability to activate L quad during stance but unable to sustain for WBing - pt with strong L pelvic weight shift/lean during standing requiring verbal and tactile cuing for improved midline orientation. At end of session, pt left supine in bed with needs in reach, bed alarm on, and L UE supported on a pillow.  Mobility Bed  Mobility Bed Mobility: Supine to Sit;Sit to Supine;Rolling Right Rolling Right: Moderate Assistance - Patient 50-74% Supine to Sit: Moderate Assistance - Patient 50-74%;Maximal Assistance - Patient - Patient 25-49% Sit to Supine: Moderate Assistance - Patient 50-74%;Maximal Assistance - Patient 25-49% Transfers Transfers: Sit to Stand;Stand to Sit;Squat Pivot Transfers Sit to Stand: Maximal Assistance - Patient 25-49% Stand to Sit: Maximal Assistance - Patient 25-49% Squat Pivot Transfers: Moderate Assistance - Patient 50-74%;Maximal Assistance - Patient 25-49% Transfer (Assistive device): None Locomotion  Gait Ambulation: No Gait Gait: No Stairs / Additional Locomotion Stairs: No Wheelchair Mobility Wheelchair Mobility: No   Discharge Criteria: Patient will be discharged from PT if patient refuses treatment 3 consecutive times without medical reason, if treatment goals not met, if there is a change in medical status, if patient makes no progress towards goals or if patient is discharged from hospital.  The above assessment, treatment plan, treatment alternatives and goals were discussed and mutually agreed upon: by patient  CTawana Scale, PT, DPT, NCS, CSRS 07/25/2022, 3:25 PM

## 2022-07-25 NOTE — Progress Notes (Signed)
Inpatient Rehabilitation  Patient information reviewed and entered into eRehab system by Elise Gladden M. Kiyomi Pallo, M.A., CCC/SLP, PPS Coordinator.  Information including medical coding, functional ability and quality indicators will be reviewed and updated through discharge.    

## 2022-07-25 NOTE — Plan of Care (Signed)
  Problem: RH Balance Goal: LTG Patient will maintain dynamic sitting balance (PT) Description: LTG:  Patient will maintain dynamic sitting balance with assistance during mobility activities (PT) Flowsheets (Taken 07/25/2022 1951) LTG: Pt will maintain dynamic sitting balance during mobility activities with:: Supervision/Verbal cueing Goal: LTG Patient will maintain dynamic standing balance (PT) Description: LTG:  Patient will maintain dynamic standing balance with assistance during mobility activities (PT) Flowsheets (Taken 07/25/2022 1951) LTG: Pt will maintain dynamic standing balance during mobility activities with:: Minimal Assistance - Patient > 75%   Problem: Sit to Stand Goal: LTG:  Patient will perform sit to stand with assistance level (PT) Description: LTG:  Patient will perform sit to stand with assistance level (PT) Flowsheets (Taken 07/25/2022 1951) LTG: PT will perform sit to stand in preparation for functional mobility with assistance level: Contact Guard/Touching assist   Problem: RH Bed Mobility Goal: LTG Patient will perform bed mobility with assist (PT) Description: LTG: Patient will perform bed mobility with assistance, with/without cues (PT). Flowsheets (Taken 07/25/2022 1951) LTG: Pt will perform bed mobility with assistance level of: Supervision/Verbal cueing   Problem: RH Bed to Chair Transfers Goal: LTG Patient will perform bed/chair transfers w/assist (PT) Description: LTG: Patient will perform bed to chair transfers with assistance (PT). Flowsheets (Taken 07/25/2022 1951) LTG: Pt will perform Bed to Chair Transfers with assistance level: Contact Guard/Touching assist   Problem: RH Car Transfers Goal: LTG Patient will perform car transfers with assist (PT) Description: LTG: Patient will perform car transfers with assistance (PT). Flowsheets (Taken 07/25/2022 1951) LTG: Pt will perform car transfers with assist:: Contact Guard/Touching assist   Problem: RH  Ambulation Goal: LTG Patient will ambulate in controlled environment (PT) Description: LTG: Patient will ambulate in a controlled environment, # of feet with assistance (PT). Flowsheets (Taken 07/25/2022 1951) LTG: Pt will ambulate in controlled environ  assist needed:: Contact Guard/Touching assist LTG: Ambulation distance in controlled environment: 151f using LRAD Goal: LTG Patient will ambulate in home environment (PT) Description: LTG: Patient will ambulate in home environment, # of feet with assistance (PT). Flowsheets (Taken 07/25/2022 1951) LTG: Pt will ambulate in home environ  assist needed:: Contact Guard/Touching assist LTG: Ambulation distance in home environment: 553fusing LRAD   Problem: RH Stairs Goal: LTG Patient will ambulate up and down stairs w/assist (PT) Description: LTG: Patient will ambulate up and down # of stairs with assistance (PT) Flowsheets (Taken 07/25/2022 1951) LTG: Pt will ambulate up/down stairs assist needed:: Minimal Assistance - Patient > 75% LTG: Pt will  ambulate up and down number of stairs: 4 steps using B HRs

## 2022-07-26 DIAGNOSIS — I6381 Other cerebral infarction due to occlusion or stenosis of small artery: Secondary | ICD-10-CM | POA: Diagnosis not present

## 2022-07-26 MED ORDER — SENNOSIDES-DOCUSATE SODIUM 8.6-50 MG PO TABS
2.0000 | ORAL_TABLET | Freq: Two times a day (BID) | ORAL | Status: DC
  Administered 2022-07-26 – 2022-07-29 (×5): 2 via ORAL
  Administered 2022-07-30: 1 via ORAL
  Administered 2022-07-30 – 2022-08-07 (×16): 2 via ORAL
  Filled 2022-07-26 (×24): qty 2

## 2022-07-26 NOTE — Progress Notes (Signed)
Orthopedic Tech Progress Note Patient Details:  Daniel [name] 27-Oct-1946 [id number] Called in order to [facility] for resting hand splint Patient ID: [name], male   DOB: 05/06/1946, 76 y.o.   MRN: [id number]  Chip Boer 07/26/2022, 3:19 PM

## 2022-07-26 NOTE — IPOC Note (Signed)
Overall Plan of Care [facility]) Patient Details Name: Daniel [name] MRN: [id number] DOB: May 21, 1946  Admitting Diagnosis: CVA (cerebral vascular accident) [facility])  Hospital Problems: Principal Problem:   CVA (cerebral vascular accident) [facility]) Active Problems:   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 ([place])     Functional Problem List: Nursing Bowel, Endurance, Medication Management, Safety  PT Balance, Safety, Endurance, Motor, Nutrition, Pain, Skin Integrity, Sensory, Edema, Behavior, Perception  OT Balance, Behavior, Pain, Perception, Cognition, Safety, Endurance, Sensory, Motor  SLP Cognition, Nutrition, Safety  TR         Basic ADL's: OT Eating, Grooming, Bathing, Dressing, Toileting     Advanced  ADL's: OT       Transfers: PT Bed Mobility, Bed to Chair, Car, Manufacturing systems engineer, Metallurgist: PT Ambulation, Emergency planning/management officer, Stairs     Additional Impairments: OT Fuctional Use of Upper Extremity  SLP Swallowing, Communication, Social Cognition expression Awareness, Problem Solving, Attention  TR      Anticipated Outcomes Item Anticipated Outcome  Self Feeding min assist  Swallowing  sup   Basic self-care  Min assist  Toileting  Min assist   Bathroom Transfers Min assist  Bowel/Bladder  manage w mod I assist  Transfers  CGA using LRAD  Locomotion  CGA using LRAD  Communication  sup  Cognition  TBD with further testing  Pain  n/a  Safety/Judgment  manage w cues   Therapy Plan: PT Intensity: Minimum of 1-2 x/day ,45 to 90 minutes PT Frequency: 5 out of 7 days PT Duration Estimated Length of Stay: ~ 3 weeks OT Intensity: Minimum of 1-2 x/day, 45 to 90 minutes OT Frequency: 5 out of 7 days OT Duration/Estimated Length of Stay: 14-18  days SLP Intensity: Minumum of 1-2 x/day, 30 to 90 minutes SLP Frequency: 3 to 5 out of 7 days SLP Duration/Estimated Length of Stay: 14-18 days   Team Interventions: Nursing  Interventions Disease Management/Prevention, Medication Management, Discharge Planning, Bowel Management, Patient/Family Education, Dysphagia/Aspiration Precaution Training  PT interventions Ambulation/gait training, Community reintegration, Engineer, drilling, Neuromuscular re-education, Psychosocial support, Stair training, UE/LE Strength taining/ROM, Wheelchair propulsion/positioning, Training and development officer, Discharge planning, Functional electrical stimulation, Pain management, Skin care/wound management, Therapeutic Activities, UE/LE Coordination activities, Cognitive remediation/compensation, Disease management/prevention, Functional mobility training, Patient/family education, Splinting/orthotics, Therapeutic Exercise, Visual/perceptual remediation/compensation  OT Interventions Balance/vestibular training, Functional electrical stimulation, Neuromuscular re-education, Patient/family education, Self Care/advanced ADL retraining, Splinting/orthotics, Therapeutic Exercise, UE/LE Coordination activities, Wheelchair propulsion/positioning, UE/LE Strength taining/ROM, Therapeutic Activities, Functional mobility training, DME/adaptive equipment instruction, Discharge planning, Cognitive remediation/compensation  SLP Interventions Cognitive remediation/compensation, Internal/external aids, Speech/Language facilitation, Functional tasks, Dysphagia/aspiration precaution training, Patient/family education, Therapeutic Activities, Therapeutic Exercise  TR Interventions    SW/CM Interventions Discharge Planning, Psychosocial Support, Patient/Family Education, Disease Management/Prevention   Barriers to Discharge MD  Medical stability  Nursing Decreased caregiver support, Home environment access/layout 1 level 3 ste w daughter; who works 1 day/weeks  PT      [place] for SNF coverage Wound Vac   Team Discharge Planning: Destination: PT-Home ,OT- Home ,  SLP-Home Projected Follow-up: PT-Outpatient PT, 24 hour supervision/assistance, Home health PT ([place] vs OP pending progress), OT-  Home health OT, SLP-Other (comment) (TBD) Projected Equipment Needs: PT-To be determined, OT- To be determined, SLP-None recommended by SLP Equipment Details: PT- , OT-need to establish what equipment currently has Patient/family involved in discharge planning: PT- Patient,  OT-Patient, SLP-Patient  MD ELOS: 8-10d Sup Medical Rehab Prognosis:  Good Assessment: The patient has been admitted for CIR therapies with the diagnosis of RIght CVA. The team will be addressing functional mobility, strength, stamina, balance, safety, adaptive techniques and equipment, self-care, bowel and bladder mgt, patient and caregiver education, HTN management. Goals have been set at Supervision. Anticipated discharge destination is Home .        See Team Conference Notes for weekly updates to the plan of care

## 2022-07-26 NOTE — Progress Notes (Signed)
Occupational Therapy Session Note  Patient Details  Name: Daniel [name] MRN: [id number] Date of Birth: [birth year]  Today's Date: 07/26/2022 OT Individual Time: 2637-8588 OT Individual Time Calculation (min): 69 min    Short Term Goals: Week 1:  OT Short Term Goal 1 (Week 1): Patient will sit at edge of bed with only intermittent min assist in preparation for a level surface transfer to wheelchair or commode OT Short Term Goal 2 (Week 1): Patient will don pull over shirt with min assist and verbal cueing after set up OT Short Term Goal 3 (Week 1): Patient will don pants over left foot with mod assistance OT Short Term Goal 4 (Week 1): Patient will maintain static sittin gbalance while on commode with only intermittent min assistance OT Short Term Goal 5 (Week 1): Patient will bathe upper body with min assist  Skilled Therapeutic Interventions/Progress Updates:  Pt greeted supine in bed with daughter present, pt agreeable to OT intervention.  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              Pt completed supine>sit with MOD A needing assist to advance LLE and elevate trunk into sitting as well as scoot hips to EOB.  Pt completed squat pivot to w/c to R side with MODA.  Pt completed dressing from EOB with MOD A for UB Dressing needing step by step cues to sequence hemi technique and MODA for LB Dressing with pt able to stand at sink with MOD A while OTA assisted pt with pulling up pants on L side.  Pt completed grooming tasks at sink with MOD A. Education provided on importance of incorporating LUE into all ADL tasks. Pt transported to gym in w/c with total A where remainder of session focus on AAROM of LUE. Pts LUE placed into arm skate with pt able to complete active assist elbow flexion/extension with trace movement noted. Pt encouraged to see his LUE  move.  Pt transported back to room with total A where pt complete squat pivot back to EOB with MOD A to R side. Pt left supine in bed with bed alarm activated and all needs within reach.           Therapy Documentation Precautions:  Precautions Precautions: Fall, Other (comment) Precaution Comments: L hemiplegia, poor postural control Restrictions Weight Bearing Restrictions: No    Pain: no pain reported during session     Therapy/Group: Individual Therapy  Precious Haws 07/26/2022, 12:45 PM

## 2022-07-26 NOTE — Progress Notes (Signed)
Patient ID: Daniel [name], male   DOB: 13-Jun-1946, 76 y.o.   MRN: [id number] Met with the patient to review current situation, rehab process, team conference and plan of care. Discussed anxiety and fear of choking, patient also has a limited selection of food that he "will eat" and "can eat".; based on no teeth and feeling that food gets stuck in his throat. Reviewed DASH diet food options along with prediabetes tips. (A1C 6.1) Discussed medications for secondary risks including HTN, HLD and DAPT x 3 wks then ASA solo per neurology. Continue to follow along to discharge to address educational needs to facilitate preparation for discharge home. Margarito Liner

## 2022-07-26 NOTE — Plan of Care (Signed)
  Problem: RH Problem Solving Goal: LTG Patient will demonstrate problem solving for (SLP) Description: LTG:  Patient will demonstrate problem solving for basic/complex daily situations with cues  (SLP) Flowsheets (Taken 07/26/2022 1220) LTG Patient will demonstrate problem solving for: Supervision   Problem: RH Memory Goal: LTG Patient will demonstrate ability for day to day (SLP) Description: LTG:   Patient will demonstrate ability for day to day recall/carryover during cognitive/linguistic activities with assist  (SLP) Flowsheets (Taken 07/26/2022 1220) LTG: Patient will demonstrate ability for day to day recall/carryover during cognitive/linguistic activities with assist (SLP): Supervision Goal: LTG Patient will use memory compensatory aids to (SLP) Description: LTG:  Patient will use memory compensatory aids to recall biographical/new, daily complex information with cues (SLP) Flowsheets (Taken 07/26/2022 1220) LTG: Patient will use memory compensatory aids to (SLP): Supervision   Problem: RH Attention Goal: LTG Patient will demonstrate this level of attention during functional activites (SLP) Description: LTG:  Patient will will demonstrate this level of attention during functional activites (SLP) Flowsheets (Taken 07/26/2022 1220) Patient will demonstrate during cognitive/linguistic activities the attention type of: Sustained LTG: Patient will demonstrate this level of attention during cognitive/linguistic activities with assistance of (SLP): Supervision   Problem: RH Awareness Goal: LTG: Patient will demonstrate awareness during functional activites type of (SLP) Description: LTG: Patient will demonstrate awareness during functional activites type of (SLP) Flowsheets (Taken 07/26/2022 1220) Patient will demonstrate during cognitive/linguistic activities awareness type of: Emergent LTG: Patient will demonstrate awareness during cognitive/linguistic activities with assistance of (SLP):  Supervision

## 2022-07-26 NOTE — Progress Notes (Signed)
Physical Therapy Session Note  Patient Details  Name: Daniel [name] MRN: [id number] Date of Birth: 10/13/1946  Today's Date: 07/26/2022 PT Individual Time: 1620-1735 PT Individual Time Calculation (min): 75 min   Short Term Goals: Week 1:  PT Short Term Goal 1 (Week 1): Pt will perform supine<>sit with mod assist consistently PT Short Term Goal 2 (Week 1): Pt will perform sit<>stands using LRAD with mod assist PT Short Term Goal 3 (Week 1): Pt will perform bed<>chair transfers using LRAD with mod assist PT Short Term Goal 4 (Week 1): Pt will ambulate at least 33f with +2 mod assist  Skilled Therapeutic Interventions/Progress Updates:    Pt greeted supine in bed, agreeable to therapy, and no c/o pain. Pt states he attempted to urinate in urinal, but missed and needed a change of underwear and pants.  WC transport to and from gym at beginning and end of session for time and energy conservation.   Therapeutic activity:  Supine<>sit min/mod A for L leg and arm management and trunk control with cuing for sequencing at beginning and end of session.  Pt able to bridge x 3 to help with donning underwear showing improvement in L LE strength since eval with manual facilitation for hooklying position, R UE able to assist, L UE unable to assist due to strength and mobility deficits.  Dynamic sitting while donning pants with mod assist for trunk management and to help with pants. Cuing for sequencing and R UE use only.  Sit<>stand from EOB with mod/max assist for balance x 1 with cues for sequencing. Pt had a strong right lateral lean with pelvis shifted towards the left and SPT blocking L knee buckling.  Sit<>[facility] UE support on wall rail x 5 with mod assist due to improved understanding of sequencing and weight shifting over R LE.  Squat pivot from EOB <> WC to the left and right at beginning and end of session with mod/max assist for trunk management and cues for hand and foot  placement.  Squat pivot from WC<>mat table with mod assist due to improved understanding of sequencing and R LE and UE involvement.    Gait training:  30 feet x 3 with R HR support at wall, max assist for balance, trunk management, and L knee stability. Another therapist for [facility] for safety and max distance. Max cuing for sequencing, foot placement, weight shifting, and posture. Pt has decreased stability of the L knee resulting in frequent buckling that SPT blocked. Decreased L swing through ability with left toe out from dragging due to decreased LLE strength. Heavy right trunk lean with verbal, tactile, and visual cues for correction frequently. Seated rest breaks taken between trials due to fatigue.   NMR:  Static sitting balance 2 min x 2 with CGA  Dynamic sitting balance mod A for trunk control.  Static standing balance Max assist for balance and trunk management with L knee blocking due to buckling.  [facility]and demonstrates increased fall risk as noted by score of   4/56.  (<36= high risk for falls, close to 100%; 37-45 significant >80%; 46-51 moderate >50%; 52-55 lower >25%).   Therapy Documentation Precautions:  Precautions Precautions: Fall, Other (comment) Precaution Comments: L hemiplegia, poor postural control Restrictions Weight Bearing Restrictions: No  Balance:  Standardized Balance Assessment Standardized Balance Assessment: Berg Balance Test Berg Balance Test Sit to Stand: Needs moderate or maximal assist to stand Standing Unsupported: Unable to  stand 30 seconds unassisted Sitting with Back Unsupported but Feet Supported on Floor or Stool: Able to sit 2 minutes under supervision Stand to Sit: Needs assistance to sit Transfers: Needs one person to assist Standing Unsupported with Eyes Closed: Needs help to keep from falling Standing Ubsupported with Feet Together: Needs help to attain position and unable to hold for 15  seconds From Standing, Reach Forward with Outstretched Arm: Loses balance while trying/requires external support From Standing Position, Pick up Object from Floor: Unable to try/needs assist to keep balance From Standing Position, Turn to Look Behind Over each Shoulder: Needs assist to keep from losing balance and falling Turn 360 Degrees: Needs assistance while turning Standing Unsupported, Alternately Place Feet on Step/Stool: Needs assistance to keep from falling or unable to try Standing Unsupported, One Foot in Front: Loses balance while stepping or standing Standing on One Leg: Unable to try or needs assist to prevent fall Total Score: 4  Therapy/Group: Individual Therapy  Kayleen Memos, SPT 07/26/2022, 5:46 PM

## 2022-07-26 NOTE — Progress Notes (Signed)
PROGRESS NOTE   Subjective/Complaints:   No wrist pain but OT rec L resting hand splint, +constipation   ROS- neg CP, SOB, N/V/D  Objective:   DG CHEST PORT 1 VIEW  Result Date: 07/24/2022 CLINICAL DATA:  Shortness of breath, dysphagia EXAM: PORTABLE CHEST 1 VIEW COMPARISON:  12/14/2006 FINDINGS: The heart size and mediastinal contours are within normal limits. Both lungs are clear. The visualized skeletal structures are unremarkable. IMPRESSION: No acute abnormality of the lungs in AP portable projection. Electronically Signed   By: Delanna Ahmadi M.D.   On: 07/24/2022 13:33   Recent Labs    07/25/22 0651  WBC 8.5  HGB 13.5  HCT 40.2  PLT 226    Recent Labs    07/25/22 0651  NA 137  K 4.0  CL 101  CO2 27  GLUCOSE 115*  BUN 23  CREATININE 1.09  CALCIUM 9.2     Intake/Output Summary (Last 24 hours) at 07/26/2022 0908 Last data filed at 07/26/2022 0430 Gross per 24 hour  Intake 240 ml  Output 675 ml  Net -435 ml         Physical Exam: Vital Signs Blood pressure 118/62, pulse 61, temperature 98.7 F (37.1 C), temperature source Oral, resp. rate 16, height '5\' 10"'$  (1.778 m), weight 81.5 kg, SpO2 94 %.  General: No acute distress Mood and affect are appropriate Heart: Regular rate and rhythm no rubs murmurs or extra sounds Lungs: Clear to auscultation, breathing unlabored, no rales or wheezes Abdomen: Positive bowel sounds, soft nontender to palpation, nondistended Extremities: No clubbing, cyanosis, or edema Skin: No evidence of breakdown, no evidence of rash Neurologic: Cranial nerves II through XII intact, motor strength is 5/5 in RIght deltoid, bicep, tricep, grip, hip flexor, knee extensors, ankle dorsiflexor and plantar flexor 2- triceps o/w 0/5 LUE, 3- HF and KE , 2- hamstrings on Left o/w 0/5  Sensory exam normal sensation to light touch and proprioception in bilateral upper and lower  extremities  Musculoskeletal: Full range of motion in all 4 extremities. No joint swelling, no pain with ROM L wrist no effusion , erythema     Assessment/Plan: 1. Functional deficits which require 3+ hours per day of interdisciplinary therapy in a comprehensive inpatient rehab setting. Physiatrist is providing close team supervision and 24 hour management of active medical problems listed below. Physiatrist and rehab team continue to assess barriers to discharge/monitor patient progress toward functional and medical goals  Care Tool:  Bathing    Body parts bathed by patient: Chest, Abdomen, Front perineal area, Right upper leg, Left upper leg, Face   Body parts bathed by helper: Buttocks, Left arm, Right arm, Right lower leg, Left lower leg     Bathing assist Assist Level: Maximal Assistance - Patient 24 - 49%     Upper Body Dressing/Undressing Upper body dressing        Upper body assist Assist Level: Moderate Assistance - Patient 50 - 74%    Lower Body Dressing/Undressing Lower body dressing      What is the patient wearing?: Underwear/pull up, Pants     Lower body assist Assist for lower body dressing: Maximal Assistance - Patient  25 - 49%     Toileting Toileting    Toileting assist Assist for toileting: Moderate Assistance - Patient 50 - 74% Assistive Device Comment: male urinal   Transfers Chair/bed transfer  Transfers assist  Chair/bed transfer activity did not occur: Safety/medical concerns  Chair/bed transfer assist level: Maximal Assistance - Patient 25 - 49% (squat pivot)     Locomotion Ambulation   Ambulation assist   Ambulation activity did not occur: Safety/medical concerns          Walk 10 feet activity   Assist  Walk 10 feet activity did not occur: Safety/medical concerns        Walk 50 feet activity   Assist Walk 50 feet with 2 turns activity did not occur: Safety/medical concerns         Walk 150 feet  activity   Assist Walk 150 feet activity did not occur: Safety/medical concerns         Walk 10 feet on uneven surface  activity   Assist Walk 10 feet on uneven surfaces activity did not occur: Safety/medical concerns         Wheelchair     Assist Is the patient using a wheelchair?: Yes Type of Wheelchair: Manual    Wheelchair assist level: Dependent - Patient 0%      Wheelchair 50 feet with 2 turns activity    Assist        Assist Level: Dependent - Patient 0%   Wheelchair 150 feet activity     Assist      Assist Level: Dependent - Patient 0%   Blood pressure 118/62, pulse 61, temperature 98.7 F (37.1 C), temperature source Oral, resp. rate 16, height '5\' 10"'$  (1.778 m), weight 81.5 kg, SpO2 94 %.  Medical Problem List and Plan: 1. 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  Antithrombotics: -DVT/anticoagulation:  Pharmaceutical: Lovenox             -antiplatelet therapy: Aspirin 81 mg daily and Plavix 75 mg day x3 weeks (end 9/22) then aspirin alone 3. Pain Management: Tylenol as needed 4. Mood/Behavior/Sleep: Provide emotional support             -antipsychotic agents: N/A 5. Neuropsych/cognition: This patient is capable of making decisions on his own behalf. 6. Skin/Wound Care: Routine skin checks 7. Fluids/Electrolytes/Nutrition: Routine in and outs with follow-up chemistries 8.  Permissive hypertension.  Losartan 50 mg nightly resumed 07/24/2022 Vitals:   07/25/22 1928 07/26/22 0418  BP: 124/65 118/62  Pulse: 78 61  Resp: 15 16  Temp: 98.8 F (37.1 C) 98.7 F (37.1 C)  SpO2: 94% 94%  Controlled 9/7  9.  Hyperlipidemia.  Crestor  10.  COnstipation add senna  LOS: 2 days A FACE TO FACE EVALUATION WAS PERFORMED  Charlett Blake 07/26/2022, 9:08 AM

## 2022-07-26 NOTE — Progress Notes (Signed)
Speech Language Pathology Daily Session Note  Patient Details  Name: Daniel [name] MRN: [id number] Date of Birth: 24-Feb-1946  Today's Date: 07/26/2022 SLP Individual Time: 0800-0900 SLP Individual Time Calculation (min): 60 min  Short Term Goals: Week 1: SLP Short Term Goal 1 (Week 1): Patient will consume current diet with minimal s/sx of aspiration and min A for implementation of safe swallowing precautions and strategies SLP Short Term Goal 2 (Week 1): Patient will implement speech intelligiblity strategies at the sentence level with min A in order to achieve >75% intelligibility SLP Short Term Goal 3 (Week 1): Patient will participate in further cognitive-linguistic evaluation for further development of ST POC SLP Short Term Goal 3 - Progress (Week 1): Met SLP Short Term Goal 4 (Week 1): Patient will complete mildly complex problem solving tasks with min-to-mod A verbal/visual cues SLP Short Term Goal 5 (Week 1): Patient will utilize external memory aids to recall novel information with min A verbal cues SLP Short Term Goal 6 (Week 1): Pt will sustained attention to functional tasks for 10 min duration given min A verbal redirection cues  Skilled Therapeutic Interventions: Skilled ST treatment focused on cognitive goals. Pt was upright in bed on arrival. Pt reported tolerance to breakfast this morning. When asked how supper went yesterday evening with dysphagia 2 textures, pt expressed "I couldn't eat any of it." Per further analysis, pt expressed this was unrelated to swallowing and rather the food did not taste good. Pt still reports concern for not being able "to get it down" and that bolus may become "stuck" in mouth or throat, thus avoiding more chewable/solid consistencies all together. Continue to recommend dysphagia 2 textures and thin liquids as tolerated per pt comfort/preference.  Daughter arrived during session and supported slower processing and decreased short-term recall since  CVA. Daughter also endorsed concern for pt's increased anxiety.This likely supports SLP's observations of nervous/anxious behaviors regarding swallow function and preference to only consume certain textures. Daughter stated pt would not be interested in initiating any medication, and would like to explore stress relief strategies. SLP notified PT/OT/Rec Therapy/MD/Nursing via secure epic chat.   [facility] Mental Status Examination was completed to evaluate the pt's cognitive-linguistic skills. Pt achieved a score of 16/30 which is below the normal limits of 27 or more out of 30. Pt exhibited difficulty with problem solving, attention, and memory components. Plan to add cognitive goals to pt's POC. Pt/daughter verbalized understanding and agreement.  Pt's speech was perceived as slightly improved as compared to initial encounter yesterday, with intelligibility perceived as ~50-75% intelligible at the sentence level. Did not have opportunity to formally address speech goals due to time constraints.   Patient was left in bed with alarm activated and immediate needs within reach at end of session. Continue per current plan of care.       Pain  None/denied  Therapy/Group: Individual Therapy  Patty Sermons 07/26/2022, 12:19 PM

## 2022-07-27 ENCOUNTER — Telehealth: Payer: Self-pay | Admitting: Family Medicine

## 2022-07-27 DIAGNOSIS — K5900 Constipation, unspecified: Secondary | ICD-10-CM

## 2022-07-27 DIAGNOSIS — E860 Dehydration: Secondary | ICD-10-CM

## 2022-07-27 DIAGNOSIS — I10 Essential (primary) hypertension: Secondary | ICD-10-CM

## 2022-07-27 MED ORDER — SORBITOL 70 % SOLN
45.0000 mL | Freq: Once | Status: AC
Start: 2022-07-27 — End: 2022-07-27
  Administered 2022-07-27: 45 mL via ORAL
  Filled 2022-07-27: qty 60

## 2022-07-27 NOTE — Progress Notes (Signed)
Occupational Therapy Session Note  Patient Details  Name: Daniel [name] MRN: [id number] Date of Birth: [birth year]  Today's Date: 07/27/2022 OT Individual Time: 9426-2700 OT Individual Time Calculation (min): 30 min    Short Term Goals: Week 1:  OT Short Term Goal 1 (Week 1): Patient will sit at edge of bed with only intermittent min assist in preparation for a level surface transfer to wheelchair or commode OT Short Term Goal 2 (Week 1): Patient will don pull over shirt with min assist and verbal cueing after set up OT Short Term Goal 3 (Week 1): Patient will don pants over left foot with mod assistance OT Short Term Goal 4 (Week 1): Patient will maintain static sittin gbalance while on commode with only intermittent min assistance OT Short Term Goal 5 (Week 1): Patient will bathe upper body with min assist  Skilled Therapeutic Interventions/Progress Updates:    Upon OT arrival, pt seated in w/c requesting to use the bathroom. Pt reports no pain and is agreeable to OT session. Treatment intervention with a focus on self care retraining and functional transfers. Pt was transported to bathroom via w/c and total A for urgency and completes stand step transfer with use of grab bars onto toilet with Mod A. Pt completes toileting with Max A-Mod A for balance. Pt requires mod verbal cues for body positioning. Increased time required secondary to difficulty voiding. Pt was unable to have BM and returns to w/c via stand pivot transfer and Mod A with use of grab bar. Pt requesting to return to bed and completes stand pivot transfer to bed with Mod A and sit to supine transfer with Mod A. Pt was left in bed at end of session with all needs met and safety measures in place. Pt limited by decreased body awareness, control, and L hemiplegia and continues to benefit from OT services to achieve highest level of independence.   Therapy Documentation Precautions:  Precautions Precautions: Fall, Other  (comment) Precaution Comments: L hemiplegia, poor postural control Restrictions Weight Bearing Restrictions: No   Therapy/Group: Individual Therapy  Marvetta Gibbons 07/27/2022, 5:04 PM

## 2022-07-27 NOTE — Progress Notes (Signed)
Physical Therapy Session Note  Patient Details  Name: Daniel [name] MRN: [id number] Date of Birth: 01-16-1946  Today's Date: 07/27/2022 PT Individual Time: 4098-1191 PT Individual Time Calculation (min): 44 min   Short Term Goals: Week 1:  PT Short Term Goal 1 (Week 1): Pt will perform supine<>sit with mod assist consistently PT Short Term Goal 2 (Week 1): Pt will perform sit<>stands using LRAD with mod assist PT Short Term Goal 3 (Week 1): Pt will perform bed<>chair transfers using LRAD with mod assist PT Short Term Goal 4 (Week 1): Pt will ambulate at least 42f with +2 mod assist  Skilled Therapeutic Interventions/Progress Updates:     Pt sitting in w/c to start with his son , Daniel [name] at the bedside. Pt agreeable to PT tx and denies pain. He reports being motivated to participate and is eager to "walk."  Required assist for repositioning in w/c due to "sliding" or "slumping" towards edge of chair. Educated him on importance of calling staff to assist if he feels like he is sliding out of chair.  Transported to main rehab gym for time management.  Squat<>pivot transfer with modA from w/c to mat table, towards his weaker L side - cues for forward weight shifting and setup.  Active warm up consisted of repeated sit<>stands using back of arm chair on his R and mirror for visual feedback. Completed 2x10 with minA for guarding L side.  Focused remainder of session on gait training and initiating stair training.  With 3-muskateers technique and +2 mod/maxA, he ambulated 560f+ [id number] [id number] rest breaks). PT facilitating upright posture, lateral weight shifting, advancing LLE 50%, and soft knee block (no true knee buckling but knee flexed in stance). Gait begins to deteriorate after ~30ft56f pt distracted from gym environment.   Stair training using 6inch steps and 1 hand rail on his R. +2 assist for safety. Navigated up/down x4 steps with modA for ascent and maxA for descent. Pt with  knee buckling on L that requires assist for recovery. Max cues for sequencing and for trunk support due to L lean.  Attempted gait training using EVA walker, ambulating ~57ft 57f +2 assist but unable to facilitate gait as well as using 3-muskateer and deferred further trials.  Squat<>pivot transfer with min/modA back to his w/c, towards his stronger R side. Transported back to his room for time management. Remained seated in w/c with safety belt alarm on, pillow supporting LUE, all needs met.   Therapy Documentation Precautions:  Precautions Precautions: Fall, Other (comment) Precaution Comments: L hemiplegia, poor postural control Restrictions Weight Bearing Restrictions: No General:     Therapy/Group: Individual Therapy  Daniel [name] P Greydis Stlouis PT 07/27/2022, 7:32 AM

## 2022-07-27 NOTE — Progress Notes (Signed)
Speech Language Pathology Daily Session Note  Patient Details  Name: Daniel [name] MRN: [id number] Date of Birth: 03/23/1946  Today's Date: 07/27/2022 SLP Individual Time: 1100-1200 SLP Individual Time Calculation (min): 60 min  Short Term Goals: Week 1: SLP Short Term Goal 1 (Week 1): Patient will consume current diet with minimal s/sx of aspiration and min A for implementation of safe swallowing precautions and strategies SLP Short Term Goal 2 (Week 1): Patient will implement speech intelligiblity strategies at the sentence level with min A in order to achieve >75% intelligibility SLP Short Term Goal 3 (Week 1): Patient will participate in further cognitive-linguistic evaluation for further development of ST POC SLP Short Term Goal 3 - Progress (Week 1): Met SLP Short Term Goal 4 (Week 1): Patient will complete mildly complex problem solving tasks with min-to-mod A verbal/visual cues SLP Short Term Goal 5 (Week 1): Patient will utilize external memory aids to recall novel information with min A verbal cues SLP Short Term Goal 6 (Week 1): Pt will sustained attention to functional tasks for 10 min duration given min A verbal redirection cues  Skilled Therapeutic Interventions: Skilled ST treatment focused on speech and cognitive goals. SLP facilitated session by providing education on speech intelligibility strategies using be "a boss" acronym. Pt utilized at the word and sentence level with max A cues to reduce speech rate, pause between words, and over-articulate. Pt exhibited minimal awareness or self correction with limited carry over throughout session. Pt sustained attention for speech tasks for 5 minute intervals with min A verbal redirection cues due to tendency to initiate side conversation, at times unrelated to current topic. Pt was oriented to person/place/time/situation with sup A verbal cues for accurate date and day of week.   Discussed pt's current diet and pt reported preference  to maintain dysphagia 2 textures at this time for mastication ease. Will address consideration for therapeutic PO trials for possible diet advancement next week.   Patient was left in wheelchair with alarm activated and immediate needs within reach at end of session. Continue per current plan of care.      Pain  None/denied  Therapy/Group: Individual Therapy  Brianne T Garretson 07/27/2022, 4:23 PM 

## 2022-07-27 NOTE — Progress Notes (Signed)
Occupational Therapy Session Note  Patient Details  Name: Daniel [name] MRN: [id number] Date of Birth: September 23, 1946  Today's Date: 07/27/2022 OT Individual Time: 0354-6568 OT Individual Time Calculation (min): 64 min    Short Term Goals: Week 1:  OT Short Term Goal 1 (Week 1): Patient will sit at edge of bed with only intermittent min assist in preparation for a level surface transfer to wheelchair or commode OT Short Term Goal 2 (Week 1): Patient will don pull over shirt with min assist and verbal cueing after set up OT Short Term Goal 3 (Week 1): Patient will don pants over left foot with mod assistance OT Short Term Goal 4 (Week 1): Patient will maintain static sittin gbalance while on commode with only intermittent min assistance OT Short Term Goal 5 (Week 1): Patient will bathe upper body with min assist  Skilled Therapeutic Interventions/Progress Updates:  Pt awake in bed with daughter present in room upon OT arrival to the room. Pt reports, "I'm going to fall over," when initiating sitting at EOB. Pt in agreement for OT session.  Therapy Documentation Precautions:  Precautions Precautions: Fall, Other (comment) Precaution Comments: L hemiplegia, poor postural control Restrictions Weight Bearing Restrictions: No Vital Signs: Please see "Flowsheet" for most recent vitals charted by nursing staff.  Pain: Pain Assessment Pain Scale: 0-10 Pain Score: 0-No pain  ADL: Grooming: Supervision/safety (Pt able to comb hair with supervision while seated in w/c using RUE.) Where Assessed-Grooming: Wheelchair, Sitting at sink Upper Body Bathing: Minimal assistance, Moderate cueing (Pt able to bathe 4/5 body parts with assistance to manage LUE to allow RUE to bathe LUE. Pt requires assistance to bathe RUE d/t LUE motor control deficits.) Where Assessed-Upper Body Bathing: Shower Lower Body Bathing: Maximal assistance, Minimal cueing (Pt able to participate in bathing 2/5 body parts. Pt  able to bathe anterior portions of B thights while seated on BSC in the shower. However, requires assistance to bathe B feet, front peri-area, and buttocks.) Where Assessed-Lower Body Bathing: Shower Upper Body Dressing: Moderate assistance, Maximal cueing (Pt able to doff and don a pull-over style shirt with maximal VCs for proper hemi-dressing techniques. Pt able to thread LUE and thread RUE with maximal VCs while seated in the w/c.) Where Assessed-Upper Body Dressing: Wheelchair Lower Body Dressing: Maximal assistance, Minimal cueing (Pt able to doff and don underwear and pants with maximal assistance and minimal VCs to recall proper sequencing for hemi-dressing techniques while seated in w/c and standing with use of bed rails/grab bars while assist provided to pull clothing up/down.) Where Assessed-Lower Body Dressing: Wheelchair Toileting:  (Pt declines need at this time.) Social research officer, government: Moderate cueing, Moderate assistance (Pt able to perform SPT from w/c <> BSC placed in shower for safe showering task with moderate assistance, blocking of LLE & support under LUE, and use of grabbars for improve safety.) Social research officer, government Method: Stand pivot Youth worker: Grab bars (BSC) ADL Comments: Pt able to perform supine > sit transfer at EOB with use of bed rails and minimal assistance to manage LLE. Pt able to maintain sitting balance at EOB with strong support of RUE on bed rail and minimal assistance d/t L lateral leaning without UE support. Pt able to perform squat-pivot transfer to the R with minimal assistance with use of RUE on arm rest of w/c and blocking of LLE & support of LUE. Pt able to participate in dressing and showering tasks with CGA - close supervision for sitting balance while seated  on BSC. Pts' daughter present at beginning of session and education provided to daughter and clothing and ADL tools to bring during admission. Pt participates in well in ADL session  and able to progress sitting balance to tolerate a shower with safety ensured.  Pt requested to stay in the w/c at end of session. Pt left sitting comfortably in the w/c with personal belongings and call light within reach, belt alarm placed and activated, LUE supported with pillow, and comfort needs attended to.   Therapy/Group: Individual Therapy  Barbee Shropshire 07/27/2022, 12:08 PM

## 2022-07-27 NOTE — Progress Notes (Addendum)
PROGRESS NOTE   Subjective/Complaints:   Reports he slept well last night. Ate 100 percent of breakfast. No additional concerns.   LBM 9/2  ROS- neg CP, SOB, N/V/D, abdominal pain, HA  Objective:   No results found. Recent Labs    07/25/22 0651  WBC 8.5  HGB 13.5  HCT 40.2  PLT 226    Recent Labs    07/25/22 0651  NA 137  K 4.0  CL 101  CO2 27  GLUCOSE 115*  BUN 23  CREATININE 1.09  CALCIUM 9.2     Intake/Output Summary (Last 24 hours) at 07/27/2022 1153 Last data filed at 07/27/2022 1038 Gross per 24 hour  Intake 240 ml  Output 850 ml  Net -610 ml         Physical Exam: Vital Signs Blood pressure 122/83, pulse 67, temperature 98.4 F (36.9 C), temperature source Oral, resp. rate 16, height '5\' 10"'$  (1.778 m), weight 80.9 kg, SpO2 93 %.  General: No acute distress, in bed Mood and affect are appropriate Heart: Regular rate and rhythm no rubs murmurs or extra sounds Lungs: Clear to auscultation, breathing unlabored, no rales or wheezes, good air movement Abdomen: Positive bowel sounds, soft nontender to palpation, nondistended Extremities: No clubbing, cyanosis, or edema Skin: No evidence of breakdown, no evidence of rash Neurologic: Cranial nerves II through XII intact, motor strength is 5/5 in RIght deltoid, bicep, tricep, grip, hip flexor, knee extensors, ankle dorsiflexor and plantar flexor 2- triceps o/w 0/5 LUE, 3- HF and KE , 2- hamstrings on Left o/w 0/5  Sensory exam normal sensation to light touch and proprioception in bilateral upper and lower extremities  Musculoskeletal: Full range of motion in all 4 extremities. No joint swelling, no pain with ROM L wrist no effusion , erythema     Assessment/Plan: 1. Functional deficits which require 3+ hours per day of interdisciplinary therapy in a comprehensive inpatient rehab setting. Physiatrist is providing close team supervision and 24 hour  management of active medical problems listed below. Physiatrist and rehab team continue to assess barriers to discharge/monitor patient progress toward functional and medical goals  Care Tool:  Bathing    Body parts bathed by patient: Chest, Abdomen, Front perineal area, Right upper leg, Left upper leg, Face   Body parts bathed by helper: Buttocks, Left arm, Right arm, Right lower leg, Left lower leg     Bathing assist Assist Level: Maximal Assistance - Patient 24 - 49%     Upper Body Dressing/Undressing Upper body dressing   What is the patient wearing?: Pull over shirt    Upper body assist Assist Level: Minimal Assistance - Patient > 75%    Lower Body Dressing/Undressing Lower body dressing      What is the patient wearing?: Pants     Lower body assist Assist for lower body dressing: Moderate Assistance - Patient 50 - 74%     Toileting Toileting    Toileting assist Assist for toileting: Moderate Assistance - Patient 50 - 74% Assistive Device Comment: male urinal   Transfers Chair/bed transfer  Transfers assist  Chair/bed transfer activity did not occur: Safety/medical concerns  Chair/bed transfer assist level: Moderate  Assistance - Patient 50 - 74% (squat pivot)     Locomotion Ambulation   Ambulation assist   Ambulation activity did not occur: Safety/medical concerns  Assist level: Maximal Assistance - Patient 25 - 49% Assistive device: Other (comment) (right wall hand railing) Max distance: 30   Walk 10 feet activity   Assist  Walk 10 feet activity did not occur: Safety/medical concerns  Assist level: Maximal Assistance - Patient 25 - 49% Assistive device: Other (comment) (right wall hand railing)   Walk 50 feet activity   Assist Walk 50 feet with 2 turns activity did not occur: Safety/medical concerns         Walk 150 feet activity   Assist Walk 150 feet activity did not occur: Safety/medical concerns         Walk 10 feet on  uneven surface  activity   Assist Walk 10 feet on uneven surfaces activity did not occur: Safety/medical concerns         Wheelchair     Assist Is the patient using a wheelchair?: Yes Type of Wheelchair: Manual    Wheelchair assist level: Dependent - Patient 0%      Wheelchair 50 feet with 2 turns activity    Assist        Assist Level: Dependent - Patient 0%   Wheelchair 150 feet activity     Assist      Assist Level: Dependent - Patient 0%   Blood pressure 122/83, pulse 67, temperature 98.4 F (36.9 C), temperature source Oral, resp. rate 16, height '5\' 10"'$  (1.778 m), weight 80.9 kg, SpO2 93 %.  Medical Problem List and Plan: 1. 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  Antithrombotics: -DVT/anticoagulation:  Pharmaceutical: Lovenox             -antiplatelet therapy: Aspirin 81 mg daily and Plavix 75 mg day x3 weeks (end 9/22) then aspirin alone 3. Pain Management: Tylenol as needed 4. Mood/Behavior/Sleep: Provide emotional support             -antipsychotic agents: N/A 5. Neuropsych/cognition: This patient is capable of making decisions on his own behalf. 6. Skin/Wound Care: Routine skin checks 7. Fluids/Electrolytes/Nutrition: Routine in and outs with follow-up chemistries Discussed with patient importance of fluid intake to prevent dehydration, Will ask nursing to encourage fluids 8.  Permissive hypertension.  Losartan 50 mg nightly resumed 07/24/2022 Vitals:   07/26/22 1957 07/27/22 0355  BP: 123/79 122/83  Pulse: 84 67  Resp: 17 16  Temp: 98.7 F (37.1 C) 98.4 F (36.9 C)  SpO2: 98% 93%  Well controlled BP 9/8  9.  Hyperlipidemia.  Crestor  10.  COnstipation add senna  -Order sorbitol  LOS: 3 days A FACE TO FACE EVALUATION WAS PERFORMED  Jennye Boroughs 07/27/2022,  11:53 AM

## 2022-07-27 NOTE — Telephone Encounter (Signed)
Spoke with daughter He is doing rehab Feeling down emotionally labile  Eating ok but minced food  She knows about his follow up appointment  She will let me know if any problems or if continues to feel down next week

## 2022-07-28 MED ORDER — LOSARTAN POTASSIUM 25 MG PO TABS
25.0000 mg | ORAL_TABLET | Freq: Every day | ORAL | Status: DC
  Administered 2022-07-29 – 2022-08-15 (×18): 25 mg via ORAL
  Filled 2022-07-28 (×18): qty 1

## 2022-07-28 NOTE — Progress Notes (Signed)
PROGRESS NOTE   Subjective/Complaints: Working with SLP Reviewing his medication list Has no new questions Tolerated therapy well today  ROS- denies CP, SOB, N/V/D, abdominal pain, HA  Objective:   No results found. No results for input(s): "WBC", "HGB", "HCT", "PLT" in the last 72 hours. No results for input(s): "NA", "K", "CL", "CO2", "GLUCOSE", "BUN", "CREATININE", "CALCIUM" in the last 72 hours.  Intake/Output Summary (Last 24 hours) at 07/28/2022 1553 Last data filed at 07/28/2022 1328 Gross per 24 hour  Intake 420 ml  Output --  Net 420 ml        Physical Exam: Vital Signs Blood pressure 132/66, pulse 65, temperature 98 F (36.7 C), temperature source Oral, resp. rate 18, height '5\' 10"'$  (1.778 m), weight 80.9 kg, SpO2 99 %.  Gen: no distress, normal appearing HEENT: oral mucosa pink and moist, NCAT Cardio: Reg rate Chest: normal effort, normal rate of breathing Abd: soft, non-distended Ext: no edema Psych: pleasant, normal affect Skin: intact Neurologic: Cranial nerves II through XII intact, motor strength is 5/5 in RIght deltoid, bicep, tricep, grip, hip flexor, knee extensors, ankle dorsiflexor and plantar flexor 2- triceps o/w 0/5 LUE, 3- HF and KE , 2- hamstrings on Left o/w 0/5  Sensory exam normal sensation to light touch and proprioception in bilateral upper and lower extremities  Musculoskeletal: Full range of motion in all 4 extremities. No joint swelling, no pain with ROM L wrist no effusion , erythema     Assessment/Plan: 1. Functional deficits which require 3+ hours per day of interdisciplinary therapy in a comprehensive inpatient rehab setting. Physiatrist is providing close team supervision and 24 hour management of active medical problems listed below. Physiatrist and rehab team continue to assess barriers to discharge/monitor patient progress toward functional and medical goals  Care  Tool:  Bathing    Body parts bathed by patient: Chest, Abdomen, Front perineal area, Right upper leg, Left upper leg, Face   Body parts bathed by helper: Buttocks, Left arm, Right arm, Right lower leg, Left lower leg     Bathing assist Assist Level: Maximal Assistance - Patient 24 - 49%     Upper Body Dressing/Undressing Upper body dressing   What is the patient wearing?: Pull over shirt    Upper body assist Assist Level: Minimal Assistance - Patient > 75%    Lower Body Dressing/Undressing Lower body dressing      What is the patient wearing?: Pants     Lower body assist Assist for lower body dressing: Moderate Assistance - Patient 50 - 74%     Toileting Toileting    Toileting assist Assist for toileting: Maximal Assistance - Patient 25 - 49% Assistive Device Comment: bedpan   Transfers Chair/bed transfer  Transfers assist  Chair/bed transfer activity did not occur: Safety/medical concerns  Chair/bed transfer assist level: Moderate Assistance - Patient 50 - 74% (squat pivot)     Locomotion Ambulation   Ambulation assist   Ambulation activity did not occur: Safety/medical concerns  Assist level: 2 helpers (mod A of 1 and +2 w/c follow) Assistive device: Other (comment) (R hallway rail) Max distance: 98f   Walk 10 feet activity   Assist  Walk 10 feet activity did not occur: Safety/medical concerns  Assist level: Maximal Assistance - Patient 25 - 49% Assistive device: Other (comment) (right wall hand railing)   Walk 50 feet activity   Assist Walk 50 feet with 2 turns activity did not occur: Safety/medical concerns         Walk 150 feet activity   Assist Walk 150 feet activity did not occur: Safety/medical concerns         Walk 10 feet on uneven surface  activity   Assist Walk 10 feet on uneven surfaces activity did not occur: Safety/medical concerns         Wheelchair     Assist Is the patient using a wheelchair?:  Yes Type of Wheelchair: Manual    Wheelchair assist level: Dependent - Patient 0%      Wheelchair 50 feet with 2 turns activity    Assist        Assist Level: Dependent - Patient 0%   Wheelchair 150 feet activity     Assist      Assist Level: Dependent - Patient 0%   Blood pressure 132/66, pulse 65, temperature 98 F (36.7 C), temperature source Oral, resp. rate 18, height '5\' 10"'$  (1.778 m), weight 80.9 kg, SpO2 99 %.  Medical Problem List and Plan: 1. 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  Antithrombotics: -DVT/anticoagulation:  Pharmaceutical: Lovenox             -antiplatelet therapy: Aspirin 81 mg daily and Plavix 75 mg day x3 weeks (end 9/22) then aspirin alone 3. Pain Management: Tylenol as needed 4. Mood/Behavior/Sleep: Provide emotional support             -antipsychotic agents: N/A 5. Neuropsych/cognition: This patient is capable of making decisions on his own behalf. 6. Skin/Wound Care: Routine skin checks 7. Fluids/Electrolytes/Nutrition: Routine in and outs with follow-up chemistries Discussed with patient importance of fluid intake to prevent dehydration, Will ask nursing to encourage fluids 8.  Permissive hypertension.  Decrease Losartan to '25mg'$  Vitals:   07/28/22 0310 07/28/22 1326  BP: 123/68 132/66  Pulse: (!) 59 65  Resp: 15 18  Temp: 99.2 F (37.3 C) 98 F (36.7 C)  SpO2: 94% 99%    9.  Hyperlipidemia.  continue Crestor  10.  COnstipation add senna  -Order sorbitol 11. Bradycardia: decrease cozaar to '25mg'$   LOS: 4 days A FACE TO FACE EVALUATION WAS PERFORMED  Clide Deutscher Maven Rosander 07/28/2022, 3:53 PM

## 2022-07-28 NOTE — Plan of Care (Signed)
  Problem: Consults Goal: RH STROKE PATIENT EDUCATION Description: See Patient Education module for education specifics  Outcome: Progressing   Problem: RH BOWEL ELIMINATION Goal: RH STG MANAGE BOWEL WITH ASSISTANCE Description: STG Manage Bowel with mod I Assistance. Outcome: Progressing Goal: RH STG MANAGE BOWEL W/MEDICATION W/ASSISTANCE Description: STG Manage Bowel with Medication with mod I Assistance. Outcome: Progressing   Problem: RH SAFETY Goal: RH STG ADHERE TO SAFETY PRECAUTIONS W/ASSISTANCE/DEVICE Description: STG Adhere to Safety Precautions With cues Assistance/Device. Outcome: Progressing   Problem: RH KNOWLEDGE DEFICIT Goal: RH STG INCREASE KNOWLEDGE OF DIABETES Description: Patient and daughter will be able to manage DM with dietary modifications using handouts and educational resources independently Outcome: Progressing Goal: RH STG INCREASE KNOWLEDGE OF HYPERTENSION Description: Patient and daughter will be able to manage HTN with medications and dietary modifications using handouts and educational resources independently Outcome: Progressing Goal: RH STG INCREASE KNOWLEGDE OF HYPERLIPIDEMIA Description: Patient and daughter will be able to manage HLD with medications and dietary modifications using handouts and educational resources independently Outcome: Progressing Goal: RH STG INCREASE KNOWLEDGE OF STROKE PROPHYLAXIS Description: Patient and daughter will be able to manage secondary risks with medications and dietary modifications using handouts and educational resources independently Outcome: Progressing

## 2022-07-28 NOTE — Progress Notes (Signed)
Occupational Therapy Session Note  Patient Details  Name: Daniel [name] MRN: [id number] Date of Birth: [birth year]  Today's Date: 07/28/2022 OT Individual Time: 9562-1308 OT Individual Time Calculation (min): 90 min    Short Term Goals: Week 1:  OT Short Term Goal 1 (Week 1): Patient will sit at edge of bed with only intermittent min assist in preparation for a level surface transfer to wheelchair or commode OT Short Term Goal 2 (Week 1): Patient will don pull over shirt with min assist and verbal cueing after set up OT Short Term Goal 3 (Week 1): Patient will don pants over left foot with mod assistance OT Short Term Goal 4 (Week 1): Patient will maintain static sittin gbalance while on commode with only intermittent min assistance OT Short Term Goal 5 (Week 1): Patient will bathe upper body with min assist  Skilled Therapeutic Interventions/Progress Updates:  Patient lying supine in bed with HOB slightly elevated upon offer for OT services.  He concurred to OT services and participated as follows:  Patient with soiled brief and asked for A for cleanup.   Bed mobility = overall max A and Mod verbal and tactile cues for technique  - UB dressing (seated in bed with HOB elevated upright) = Mod A for hemi dressing techniques, extra time to process, sequence, and px solve  - Supine in bed (HOB elevated 30 degrees)  to EOB transfer =  mod A and mod verbal cues for technique and safety regarding hemiplegic L UE placement   -  LB dressing sitting EOB, sit to stand with walker for support = maximal assistance        Dynamic sitting balance= max A  - L UE neuro re education (extremity flaccid this session)  - family education with grown dtr who arrived at end of session (His 77 year old dtr present also) to see him sit to stand to pull up pants at EOB and then transfer back into bed;   dtr and patient also educated on 3 self ROM exercises and elevating L hand to help reduce edema and help with  patient awareness of left side of body and safety  Patient left in bed with alarm engaged and dtrs present, at the end of the session  Continue patient plan of care.      Therapy Documentation Precautions:  Precautions Precautions: Fall, Other (comment) Precaution Comments: L hemiplegia, poor postural control Restrictions Weight Bearing Restrictions: No General:   Pain: Pain Assessment Pain Scale: 0-10 Pain Score: 0-No pain   Therapy/Group: Individual Therapy  Alfredia Ferguson [facility] 07/28/2022, 2:27 PM

## 2022-07-28 NOTE — Progress Notes (Signed)
Physical Therapy Session Note  Patient Details  Name: Daniel [name] MRN: [id number] Date of Birth: 07-12-1946  Today's Date: 07/28/2022 PT Individual Time: 1011-1054 and 1407-1506 PT Individual Time Calculation (min): 43 min and 59 min  Short Term Goals: Week 1:  PT Short Term Goal 1 (Week 1): Pt will perform supine<>sit with mod assist consistently PT Short Term Goal 2 (Week 1): Pt will perform sit<>stands using LRAD with mod assist PT Short Term Goal 3 (Week 1): Pt will perform bed<>chair transfers using LRAD with mod assist PT Short Term Goal 4 (Week 1): Pt will ambulate at least 44f with +2 mod assist  Skilled Therapeutic Interventions/Progress Updates:    Session 1: Pt received R sidelying in bed with his daughters present and pt agreeable to therapy session. Therapist educated pt's daughter, AGlenard Haring on plan for team conference Wednesday to discuss pt's CLOF, LTGs, and set a D/C date - therapist requested pt's daughter bring in pt some tennis shoes and educated her that we will want her to come in for family education/training prior to pt's D/C. Also, discussed home entry with her confirming pt has 3 STE with B HRs (too wide to reach at same time) and she reports L HR is not stable with therapist asking for that to be fixed prior to pt's D/C. Pt's daughter verbalized understanding of all education.   Pt performed sidelying>sitting EOB with heavy min assist for L UE management and  trunk upright with verbal cuing for sequencing to increase independence.  Pt able to maintain static sitting EOB with close supervision.   L squat pivot transfer EOB>w/c with mod assist and max cuing for head/hips relationship and using B LEs to lift hips during transfer - therapist managing L LE positioning throughout.  Pt reports he feels he has been incontinent of BM. Transported in/out bathroom in w/c.   Stand pivot w/c<>BSC over toilet using R UE support on grab bar with heavy mod assist of 1 for lifting,  balance, and L LE stepping/positioning and blocking L knee during stance to avoid buckle - pt requires cuing for R weight shift due to L lean. Standing with R UE support on grab bar and light mod assist of 1 for balance and guarding L knee during dependent assist LB clothing management and peri-care (pt unable to void further on toilet).    Transported to/from gym in w/c for time management and energy conservation.  Sit<>stands to/from R UE support on hallway rail with mod assist.   Donned L LE ankle DF assist ACE wrap.  Gait training 522f+ 638fsing R UE support on hallway rail with heavy mod assist of 1 for balance and L LE management with +2 w/c follow. Pt demonstrating the following gait deviations with therapist providing the described cuing and facilitation for improvement:  - able to advance L LE >75% of the way during swing but requires manual facilitation for positioning to avoid excessive external rotation - requires guarding/blocking L knee during stance with manual facilitation for L hip extension and forward advancement of pelvis over L stance limb - pt often pulls himself R using R UE support on hallway rail during R stance phase rather than weight shifting at his hips, cuing to improve - continues to have excessive R lateral trunk flexion requiring cuing to maintain trunk upright in midline  Transported back to room and pt left seated in w/c with needs in reach and seat belt alarm on.    Session 2:  Pt received asleep, supine in bed but easily awakens and agreeable to therapy session. Pt reports he is not sleeping well at night.  Supine>sitting R EOB, HOB partially elevated and using bedrail, with min assist for L hemibody management and continued cuing for sequencing logroll technique to increase independence.  L squat pivot transfer EOB>w/c with mod assist for lifting/pivoting hips and cuing for increased R anterior trunk lean and pushing down through LEs to lift hips.    Transported to/from gym in w/c for time management and energy conservation.  Gait training 71f + [id number] (seated break between each) using R HHA from +2 assist and L UE around therapist's shoulders with +2 mod assist for balance and L LE management. Pt demonstrates the following gait deviations with therapist providing the described cuing and facilitation for improvement:  - requires manual facilitation to block L knee buckle and block hyperextension as pt tends to go from one extreme to the other, while facilitating increased L hip extension and anterior translation of pelvis over L stance limb - with additional gait trials demos improved L hip/knee extension during stance (after standing mirror NMR for upright, midline posture) - able to advance L LE 90-100% of the way during swing requiring manual facilitation to improve heel contact and avoid excessive external rotation - continues to have R lateral trunk flexion throughout gait that improves after mirror NMR and with repeated cuing to "stand tall "  In between 2 gait trials, performed sit<>stands to/from EOM with no R UE support, L UE around therapist with heavy mod assist for balance - using mirror feedback to learn midline orientation and increasing L hip/knee extension activation. Pt does well carrying this over into following gait trials.  Pt requesting to return to bed and rest after therapy. Transported back to room. R squat pivot transfer w/c>EOB with mod assist for lifting/pivoting hips and continued cuing for anterior trunk lean. Sit>supine with mod assist for L hemibody management. Pt left supine in bed with needs in reach, bed alarm on, family present, and L UE supported on pillows. Pt's daughter brought in tennis shoes for future sessions.   Therapy Documentation Precautions:  Precautions Precautions: Fall, Other (comment) Precaution Comments: L hemiplegia, poor postural control Restrictions Weight Bearing Restrictions:  No   Pain:  Session 1: No reports of pain throughout session.  Session 2: Reports some L knee pain during gait training (unrated) and pt with difficulty describing it and locating it but states "it's not bad enough for me to need to stop" - therapist ensured adjusting support to maintain improved knee alignment and provided pt with rest breaks as needed for pain management.    Therapy/Group: Individual Therapy  CaTawana Scale PT, DPT, NCS, CSRS 07/28/2022, 7:59 AM

## 2022-07-28 NOTE — Progress Notes (Signed)
Speech Language Pathology Daily Session Note  Patient Details  Name: Daniel [name] MRN: [id number] Date of Birth: 10-15-1946  Today's Date: 07/28/2022 SLP Individual Time: 1116-1200 SLP Individual Time Calculation (min): 44 min  Short Term Goals: Week 1: SLP Short Term Goal 1 (Week 1): Patient will consume current diet with minimal s/sx of aspiration and min A for implementation of safe swallowing precautions and strategies SLP Short Term Goal 2 (Week 1): Patient will implement speech intelligiblity strategies at the sentence level with min A in order to achieve >75% intelligibility SLP Short Term Goal 3 (Week 1): Patient will participate in further cognitive-linguistic evaluation for further development of ST POC SLP Short Term Goal 3 - Progress (Week 1): Met SLP Short Term Goal 4 (Week 1): Patient will complete mildly complex problem solving tasks with min-to-mod A verbal/visual cues SLP Short Term Goal 5 (Week 1): Patient will utilize external memory aids to recall novel information with min A verbal cues SLP Short Term Goal 6 (Week 1): Pt will sustained attention to functional tasks for 10 min duration given min A verbal redirection cues  Skilled Therapeutic Interventions: Pt seen for skilled ST with focus on cognitive and speech goals, pt upright in wheelchair and agreeable to therapeutic tasks. SLP facilitated session by providing re-education on speech intelligibility strategies using "A BOSS" acronym as posted in patient's room. Pt benefiting from overall mod A cues to utilize slow rate and over articulation during structured and unstructured language tasks to increase intelligibility to ~85%. Pt completing money counting/simple mathematics activity with 100% accuracy provided Supervision A verbal cues. Pt reports only taking 1 pill at home (blood pressure) and is unsure what he is currently taking. SLP providing written list of medications, dosages, rationale, etc. Which patient was  appreciative of. He reports "not wanting to take the whole medicine cabinet at home". Pt left in wheelchair with MD present for rounds, cont ST POC.  Pain Pain Assessment Pain Scale: 0-10 Pain Score: 0-No pain  Therapy/Group: Individual Therapy  Dewaine Conger 07/28/2022, 11:59 AM

## 2022-07-29 NOTE — Progress Notes (Signed)
PROGRESS NOTE   Subjective/Complaints: Feeling pretty good this morning. Was incontinent of urine.  SBP elevated, then soft No issues reported overnight  ROS- denies CP, SOB, N/V/D, abdominal pain, HA, +urinary incontinence  Objective:   No results found. No results for input(s): "WBC", "HGB", "HCT", "PLT" in the last 72 hours. No results for input(s): "NA", "K", "CL", "CO2", "GLUCOSE", "BUN", "CREATININE", "CALCIUM" in the last 72 hours.  Intake/Output Summary (Last 24 hours) at 07/29/2022 0939 Last data filed at 07/29/2022 0743 Gross per 24 hour  Intake 894 ml  Output 580 ml  Net 314 ml        Physical Exam: Vital Signs Blood pressure (!) 107/51, pulse (!) 51, temperature 98.9 F (37.2 C), temperature source Oral, resp. rate 16, height '5\' 10"'$  (1.778 m), weight 80.9 kg, SpO2 94 %.  Gen: no distress, normal appearing HEENT: oral mucosa pink and moist, NCAT Cardio: Bradycardia Chest: normal effort, normal rate of breathing Abd: soft, non-distended Ext: no edema Psych: pleasant, normal affect Skin: intact Neurologic: Cranial nerves II through XII intact, motor strength is 5/5 in RIght deltoid, bicep, tricep, grip, hip flexor, knee extensors, ankle dorsiflexor and plantar flexor 2- triceps o/w 0/5 LUE, 3- HF and KE , 2- hamstrings on Left o/w 0/5  Sensory exam normal sensation to light touch and proprioception in bilateral upper and lower extremities  Musculoskeletal: Full range of motion in all 4 extremities. No joint swelling, no pain with ROM L wrist no effusion , erythema     Assessment/Plan: 1. Functional deficits which require 3+ hours per day of interdisciplinary therapy in a comprehensive inpatient rehab setting. Physiatrist is providing close team supervision and 24 hour management of active medical problems listed below. Physiatrist and rehab team continue to assess barriers to discharge/monitor patient  progress toward functional and medical goals  Care Tool:  Bathing    Body parts bathed by patient: Chest, Abdomen, Front perineal area, Right upper leg, Left upper leg, Face   Body parts bathed by helper: Buttocks, Left arm, Right arm, Right lower leg, Left lower leg     Bathing assist Assist Level: Maximal Assistance - Patient 24 - 49%     Upper Body Dressing/Undressing Upper body dressing   What is the patient wearing?: Pull over shirt    Upper body assist Assist Level: Minimal Assistance - Patient > 75%    Lower Body Dressing/Undressing Lower body dressing      What is the patient wearing?: Pants     Lower body assist Assist for lower body dressing: Moderate Assistance - Patient 50 - 74%     Toileting Toileting    Toileting assist Assist for toileting: Maximal Assistance - Patient 25 - 49% Assistive Device Comment: bedpan   Transfers Chair/bed transfer  Transfers assist  Chair/bed transfer activity did not occur: Safety/medical concerns  Chair/bed transfer assist level: Moderate Assistance - Patient 50 - 74% (squat pivot)     Locomotion Ambulation   Ambulation assist   Ambulation activity did not occur: Safety/medical concerns  Assist level: 2 helpers (mod A of 1 and +2 w/c follow) Assistive device: Other (comment) (R hallway rail) Max distance: 69f  Walk 10 feet activity   Assist  Walk 10 feet activity did not occur: Safety/medical concerns  Assist level: Maximal Assistance - Patient 25 - 49% Assistive device: Other (comment) (right wall hand railing)   Walk 50 feet activity   Assist Walk 50 feet with 2 turns activity did not occur: Safety/medical concerns         Walk 150 feet activity   Assist Walk 150 feet activity did not occur: Safety/medical concerns         Walk 10 feet on uneven surface  activity   Assist Walk 10 feet on uneven surfaces activity did not occur: Safety/medical concerns          Wheelchair     Assist Is the patient using a wheelchair?: Yes Type of Wheelchair: Manual    Wheelchair assist level: Dependent - Patient 0%      Wheelchair 50 feet with 2 turns activity    Assist        Assist Level: Dependent - Patient 0%   Wheelchair 150 feet activity     Assist      Assist Level: Dependent - Patient 0%   Blood pressure (!) 107/51, pulse (!) 51, temperature 98.9 F (37.2 C), temperature source Oral, resp. rate 16, height '5\' 10"'$  (1.778 m), weight 80.9 kg, SpO2 94 %.  Medical Problem List and Plan: 1. 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  Antithrombotics: -DVT/anticoagulation:  Pharmaceutical: Lovenox             -antiplatelet therapy: Aspirin 81 mg daily and Plavix 75 mg day x3 weeks (end 9/22) then aspirin alone 3. Pain Management: Tylenol as needed 4. Mood/Behavior/Sleep: Provide emotional support             -antipsychotic agents: N/A 5. Neuropsych/cognition: This patient is capable of making decisions on his own behalf. 6. Skin/Wound Care: Routine skin checks 7. Fluids/Electrolytes/Nutrition: Routine in and outs with follow-up chemistries Discussed with patient importance of fluid intake to prevent dehydration, Will ask nursing to encourage fluids 8.  Permissive hypertension.  Decrease Losartan to '25mg'$ , continue this dose Vitals:   07/28/22 1945 07/29/22 0357  BP: (!) 141/78 (!) 107/51  Pulse: 72 (!) 51  Resp: 18 16  Temp: 98.4 F (36.9 C) 98.9 F (37.2 C)  SpO2: 95% 94%    9.  Hyperlipidemia.  continue Crestor  10.  COnstipation add senna  -Order sorbitol 11. Bradycardia: decrease cozaar to '25mg'$   LOS: 5 days A FACE TO FACE EVALUATION WAS PERFORMED  Clide Deutscher Derrich Gaby 07/29/2022, 9:39 AM

## 2022-07-29 NOTE — Plan of Care (Signed)
  Problem: Consults Goal: RH STROKE PATIENT EDUCATION Description: See Patient Education module for education specifics  Outcome: Progressing   Problem: RH BOWEL ELIMINATION Goal: RH STG MANAGE BOWEL WITH ASSISTANCE Description: STG Manage Bowel with mod I Assistance. Outcome: Progressing Goal: RH STG MANAGE BOWEL W/MEDICATION W/ASSISTANCE Description: STG Manage Bowel with Medication with mod I Assistance. Outcome: Progressing   Problem: RH SAFETY Goal: RH STG ADHERE TO SAFETY PRECAUTIONS W/ASSISTANCE/DEVICE Description: STG Adhere to Safety Precautions With cues Assistance/Device. Outcome: Progressing   Problem: RH KNOWLEDGE DEFICIT Goal: RH STG INCREASE KNOWLEDGE OF DIABETES Description: Patient and daughter will be able to manage DM with dietary modifications using handouts and educational resources independently Outcome: Progressing Goal: RH STG INCREASE KNOWLEDGE OF HYPERTENSION Description: Patient and daughter will be able to manage HTN with medications and dietary modifications using handouts and educational resources independently Outcome: Progressing Goal: RH STG INCREASE KNOWLEGDE OF HYPERLIPIDEMIA Description: Patient and daughter will be able to manage HLD with medications and dietary modifications using handouts and educational resources independently Outcome: Progressing Goal: RH STG INCREASE KNOWLEDGE OF STROKE PROPHYLAXIS Description: Patient and daughter will be able to manage secondary risks with medications and dietary modifications using handouts and educational resources independently Outcome: Progressing

## 2022-07-30 NOTE — Progress Notes (Signed)
Speech Language Pathology Daily Session Note  Patient Details  Name: Daniel [name] MRN: [id number] Date of Birth: 07/25/1946  Today's Date: 07/30/2022 SLP Individual Time: 2780-0447 SLP Individual Time Calculation (min): 29 min  Short Term Goals: Week 1: SLP Short Term Goal 1 (Week 1): Patient will consume current diet with minimal s/sx of aspiration and min A for implementation of safe swallowing precautions and strategies SLP Short Term Goal 2 (Week 1): Patient will implement speech intelligiblity strategies at the sentence level with min A in order to achieve >75% intelligibility SLP Short Term Goal 3 (Week 1): Patient will participate in further cognitive-linguistic evaluation for further development of ST POC SLP Short Term Goal 3 - Progress (Week 1): Met SLP Short Term Goal 4 (Week 1): Patient will complete mildly complex problem solving tasks with min-to-mod A verbal/visual cues SLP Short Term Goal 5 (Week 1): Patient will utilize external memory aids to recall novel information with min A verbal cues SLP Short Term Goal 6 (Week 1): Pt will sustained attention to functional tasks for 10 min duration given min A verbal redirection cues  Skilled Therapeutic Interventions: Skilled treatment session focused on communication goals. SLP facilitated session by providing supervision level verbal cues for recall of his speech intelligibility strategies with use of external aids. Patient participated in a functional, informal conversation that focused on awareness of deficits and recall of therapy events/tasks. Patient was ~90% intelligible with Mod I for use of compensatory strategies. [name] daughter present and reported that patient's speech appears close to baseline. SLP provided ongoing education regarding the importance of utilizing strategies such as over-articulation to also improve strength and ROM of oral-motor musculature. Both verbalized understanding. Patient left upright in wheelchair  with alarm on and all needs within reach. Continue with current plan of care.       Pain No/Denies Pain   Therapy/Group: Individual Therapy  Amilah Greenspan, [place] 07/30/2022, 3:21 PM

## 2022-07-30 NOTE — Progress Notes (Signed)
PROGRESS NOTE   Subjective/Complaints:  No issues overnite , had some Left shoulder pain this am when showering with OT< discussed with OT, no hx of shoulder trauma   ROS- denies CP, SOB, N/V/D, abdominal pain, HA, +urinary incontinence  Objective:   No results found. No results for input(s): "WBC", "HGB", "HCT", "PLT" in the last 72 hours. No results for input(s): "NA", "K", "CL", "CO2", "GLUCOSE", "BUN", "CREATININE", "CALCIUM" in the last 72 hours.  Intake/Output Summary (Last 24 hours) at 07/30/2022 0830 Last data filed at 07/30/2022 0631 Gross per 24 hour  Intake --  Output 350 ml  Net -350 ml         Physical Exam: Vital Signs Blood pressure 111/65, pulse (!) 57, temperature 98 F (36.7 C), resp. rate 16, height '5\' 10"'$  (1.778 m), weight 80.9 kg, SpO2 94 %.   General: No acute distress Mood and affect are appropriate Heart: Regular rate and rhythm no rubs murmurs or extra sounds Lungs: Clear to auscultation, breathing unlabored, no rales or wheezes Abdomen: Positive bowel sounds, soft nontender to palpation, nondistended Extremities: No clubbing, cyanosis, or edema Skin: No evidence of breakdown, no evidence of rash   Neurologic: Cranial nerves II through XII intact, motor strength is 5/5 in RIght deltoid, bicep, tricep, grip, hip flexor, knee extensors, ankle dorsiflexor and plantar flexor 2- triceps o/w 0/5 LUE, 3- HF and KE , 2- hamstrings on Left o/w 0/5  Sensory exam normal sensation to light touch and proprioception in bilateral upper and lower extremities  Musculoskeletal: Full range of motion in all 4 extremities. No joint swelling, Left shoulder  pain - abd and ER , mild to moderate with ROM L wrist no effusion , erythema     Assessment/Plan: 1. Functional deficits which require 3+ hours per day of interdisciplinary therapy in a comprehensive inpatient rehab setting. Physiatrist is providing close  team supervision and 24 hour management of active medical problems listed below. Physiatrist and rehab team continue to assess barriers to discharge/monitor patient progress toward functional and medical goals  Care Tool:  Bathing    Body parts bathed by patient: Chest, Abdomen, Front perineal area, Right upper leg, Left upper leg, Face   Body parts bathed by helper: Buttocks, Left arm, Right arm, Right lower leg, Left lower leg     Bathing assist Assist Level: Maximal Assistance - Patient 24 - 49%     Upper Body Dressing/Undressing Upper body dressing   What is the patient wearing?: Pull over shirt    Upper body assist Assist Level: Minimal Assistance - Patient > 75%    Lower Body Dressing/Undressing Lower body dressing      What is the patient wearing?: Pants     Lower body assist Assist for lower body dressing: Moderate Assistance - Patient 50 - 74%     Toileting Toileting    Toileting assist Assist for toileting: Maximal Assistance - Patient 25 - 49% Assistive Device Comment: bedpan   Transfers Chair/bed transfer  Transfers assist  Chair/bed transfer activity did not occur: Safety/medical concerns  Chair/bed transfer assist level: Moderate Assistance - Patient 50 - 74% (squat pivot)     Locomotion Ambulation  Ambulation assist   Ambulation activity did not occur: Safety/medical concerns  Assist level: 2 helpers (mod A of 1 and +2 w/c follow) Assistive device: Other (comment) (R hallway rail) Max distance: 43f   Walk 10 feet activity   Assist  Walk 10 feet activity did not occur: Safety/medical concerns  Assist level: Maximal Assistance - Patient 25 - 49% Assistive device: Other (comment) (right wall hand railing)   Walk 50 feet activity   Assist Walk 50 feet with 2 turns activity did not occur: Safety/medical concerns         Walk 150 feet activity   Assist Walk 150 feet activity did not occur: Safety/medical concerns          Walk 10 feet on uneven surface  activity   Assist Walk 10 feet on uneven surfaces activity did not occur: Safety/medical concerns         Wheelchair     Assist Is the patient using a wheelchair?: Yes Type of Wheelchair: Manual    Wheelchair assist level: Dependent - Patient 0%      Wheelchair 50 feet with 2 turns activity    Assist        Assist Level: Dependent - Patient 0%   Wheelchair 150 feet activity     Assist      Assist Level: Dependent - Patient 0%   Blood pressure 111/65, pulse (!) 57, temperature 98 F (36.7 C), resp. rate 16, height '5\' 10"'$  (1.778 m), weight 80.9 kg, SpO2 94 %.  Medical Problem List and Plan: 1. 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  Antithrombotics: -DVT/anticoagulation:  Pharmaceutical: Lovenox             -antiplatelet therapy: Aspirin 81 mg daily and Plavix 75 mg day x3 weeks (end 9/22) then aspirin alone 3. Pain Management: Tylenol as needed 4. Mood/Behavior/Sleep: Provide emotional support             -antipsychotic agents: N/A 5. Neuropsych/cognition: This patient is capable of making decisions on his own behalf. 6. Skin/Wound Care: Routine skin checks 7. Fluids/Electrolytes/Nutrition: Routine in and outs with follow-up chemistries Discussed with patient importance of fluid intake to prevent dehydration, Will ask nursing to encourage fluids 8.  Permissive hypertension.  Decrease Losartan to '25mg'$ , continue this dose Vitals:   07/29/22 1953 07/30/22 0431  BP: 136/79 111/65  Pulse: 64 (!) 57  Resp: 18 16  Temp: 98.2 F (36.8 C) 98 F (36.7 C)  SpO2: 92% 94%    9.  Hyperlipidemia.  continue Crestor  10.  COnstipation  add senna  -Order sorbitol   LOS: 6 days A FACE TO FACE EVALUATION WAS PERFORMED  ACharlett Blake9/09/2022, 8:30 AM

## 2022-07-30 NOTE — Progress Notes (Addendum)
Occupational Therapy Session Note  Patient Details  Name: Daniel [name] MRN: [id number] Date of Birth: [birth year]  Today's Date: 07/30/2022 OT Individual Time: 0805-0905 session 1 OT Individual Time Calculation (min): 60 min  Session 2: 1425-1448  Missed 7 mins( therapist running late from previous session)   Short Term Goals: Week 1:  OT Short Term Goal 1 (Week 1): Patient will sit at edge of bed with only intermittent min assist in preparation for a level surface transfer to wheelchair or commode OT Short Term Goal 2 (Week 1): Patient will don pull over shirt with min assist and verbal cueing after set up OT Short Term Goal 3 (Week 1): Patient will don pants over left foot with mod assistance OT Short Term Goal 4 (Week 1): Patient will maintain static sittin gbalance while on commode with only intermittent min assistance OT Short Term Goal 5 (Week 1): Patient will bathe upper body with min assist  Skilled Therapeutic Interventions/Progress Updates:  Session 1: Pt greeted supine in bed, pt c/o L arm pain from "being in bed all weekend. Provided PROM for pain mgmt and utilized repositioning as needed.   agreeable to OT intervention. Session focus on BADL reeducation, functional mobility, dynamic standing balance and decreasing overall caregiver burden.   See below for ADL assist levels. 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 Daughter enter in middle of session. Pt left seated in w/c with LUE supported EOB and SLP entering.                   Session 2:  Pt greeted supine in bed, pt agreeable to OT intervention. Pt reported he didn't eat any of his lunch tray as he didn't like anything on the tray. Assisted pt with ordering dinner tray. Pt reports fatigue wanting to stay in bed for session. Session focus on AAROM in [place] arm mobilizer from upright position in bed with an emphasis on  scapular protraction/retraction with arm mobilizer set at resistance of 3.  Pt completed 2x10 reps Pt also worked on elbow flexion/extension in arm mobilizer with pt needing + assist with this movement even in gravity eliminated field, pt completed 2x10 reps.  Pt left supine in bed with bed alarm activated and all needs within reach.  No pain reported during session  Therapy Documentation Precautions:  Precautions Precautions: Fall, Other (comment) Precaution Comments: L hemiplegia, poor postural control Restrictions Weight Bearing Restrictions: No  ADL: ADL Grooming: Supervision/safety (Pt able to comb hair with supervision while seated in w/c using RUE.) Where Assessed-Grooming: Wheelchair, Sitting at sink Upper Body Bathing: Minimal assistance (pt needed assist to wash RUE but able to stand with MINA while pt stood to wash buttock and front perineal area) Where Assessed-Upper Body Bathing: Shower Lower Body Bathing: Minimal assistance Where Assessed-Lower Body Bathing: Shower, Other (Comment) (standing in shower with MINA to wash posterior/anterior periarea) Upper Body Dressing: Minimal assistance (to don OH shirt, great recall of hemi technique, light MIN A needed only to pull sleeve down on L side) Where Assessed-Upper Body Dressing: Sitting at sink Lower Body Dressing: Moderate assistance (pt needed assist to thread LLE but able to stand at sink to pull pants up to waist line needed assist to pull pants up on L side) Where Assessed-Lower Body Dressing: Standing at sink Toileting:  (Pt declines need at this time.) Where Assessed-Toileting: Other (Comment) (seated in wheelchair) Toilet  Transfer: Moderate assistance Toilet Transfer Method: Squat pivot Toilet Transfer Equipment: Drop arm bedside commode Tub/Shower Transfer: Unable to assess Tub/Shower Transfer Method: Stand pivot Tub/Shower Equipment: Other (comment) (TBD) Social research officer, government: Moderate cueing, Moderate  assistance (cues needed to sequence pivot to [facility] in shower with use of grab bars ;MOD physical assist for balance) Social research officer, government Method: Stand pivot Youth worker: Grab bars (BSC) ADL Comments: Pt able to perform supine > sit transfer at EOB with use of bed rails and minimal assistance to manage LLE. Pt able to maintain sitting balance at EOB with strong support of RUE on bed rail and minimal assistance d/t L lateral leaning without UE support. Pt able to perform squat-pivot transfer to the R with minimal assistance with use of RUE on arm rest of w/c and blocking of LLE & support of LUE. Pt able to participate in dressing and showering tasks with CGA - close supervision for sitting balance while seated on BSC. Pts' daughter present at beginning of session and education provided to daughter and clothing and ADL tools to bring during admission. Pt participates in well in ADL session and able to progress sitting balance to tolerate a shower with safety ensured.    Therapy/Group: Individual Therapy  Corinne Ports [facility] 07/30/2022, 12:08 PM

## 2022-07-30 NOTE — Progress Notes (Signed)
Physical Therapy Session Note  Patient Details  Name: Daniel [name] MRN: [id number] Date of Birth: [birth year]  Today's Date: 07/30/2022 PT Individual Time: 0947-1100 PT Individual Time Calculation (min): 73 min   Short Term Goals: Week 1:  PT Short Term Goal 1 (Week 1): Pt will perform supine<>sit with mod assist consistently PT Short Term Goal 2 (Week 1): Pt will perform sit<>stands using LRAD with mod assist PT Short Term Goal 3 (Week 1): Pt will perform bed<>chair transfers using LRAD with mod assist PT Short Term Goal 4 (Week 1): Pt will ambulate at least 22f with +2 mod assist Week 2:    Week 3:     Skilled Therapeutic Interventions/Progress Updates:    Pt initially oob in wc.  Wc propulsion [id number] gym w/cues using hemi technique. L DF assist acewrap applied by PT.   Gait x 6071f/+2 max assist, pt fails to advance L hip over L knee ending stance pase prior to midtance  then attempting to step w/R.  Heavy manual assist to facilitate advancement thru midstance.  Therapist demonstrated above deviation vs normal progress thru stance phase during seated rest. Pt able to maintain static sitting EOM with close supervision.  Repeated above gait.  Pt transported to parallel bars to work on gait mechanics/sequencing via single step progressing to gait in bars focusing on wt shifting, midstance L knee extension w/advancement of pelvis over limb and maintaining erect trunk prior to initiating swing on R.  Repeated several trials.  Gait in hallway w/HHA on R and therapist on L providing swing assist/placement (tends to scissor) and heavy cueing for sequencing as described above w/improved gait mechanics at slowed speed to allow pt to process and implement.  [id number].    Pt left oob in wc w/alarm belt set and needs in reach   Therapy Documentation Precautions:  Precautions Precautions: Fall, Other (comment) Precaution Comments: L hemiplegia, poor postural  control Restrictions Weight Bearing Restrictions: No    Therapy/Group: Individual Therapy BarbCallie Fielding  [place]1/2023, 10:52 AM

## 2022-07-31 NOTE — Progress Notes (Signed)
Orthopedic Tech Progress Note Patient Details:  Daniel [name] Feb 24, 1946 [id number]  Called in order to [employer] for a WRIST COCK UP SPLINT   Patient ID: Daniel [name], male   DOB: November 15, 1946, 76 y.o.   MRN: [id number]  [name] 07/31/2022, 8:15 AM

## 2022-07-31 NOTE — Progress Notes (Signed)
Speech Language Pathology Daily Session Note  Patient Details  Name: Daniel [name] MRN: [id number] Date of Birth: 03-11-1946  Today's Date: 07/31/2022 SLP Individual Time: 1016-1100 SLP Individual Time Calculation (min): 44 min  Short Term Goals: Week 1: SLP Short Term Goal 1 (Week 1): Patient will consume current diet with minimal s/sx of aspiration and min A for implementation of safe swallowing precautions and strategies SLP Short Term Goal 2 (Week 1): Patient will implement speech intelligiblity strategies at the sentence level with min A in order to achieve >75% intelligibility SLP Short Term Goal 3 (Week 1): Patient will participate in further cognitive-linguistic evaluation for further development of ST POC SLP Short Term Goal 3 - Progress (Week 1): Met SLP Short Term Goal 4 (Week 1): Patient will complete mildly complex problem solving tasks with min-to-mod A verbal/visual cues SLP Short Term Goal 5 (Week 1): Patient will utilize external memory aids to recall novel information with min A verbal cues SLP Short Term Goal 6 (Week 1): Pt will sustained attention to functional tasks for 10 min duration given min A verbal redirection cues  Skilled Therapeutic Interventions: Pt seen for skilled ST with focus on swallowing and cognitive goals, pt in bed and agreeable to therapeutic tasks. Pt continues to endorse difficulty swallowing and fear of choking, however agreeable to trials of Dys 3 textures to assess oropharyngeal swallow at this time. Pt repositioned in bet with NT assistance and HOB raised to 90 degrees. SLP providing patient soft cookie and pt stating "Oh I can't eat that", however with encouragement pt consuming 75% of soft cookie with no difficulty or s/s aspiration. Pt Mod I for small bites, small sips and utilizing thin via straw for liquid assistance as needed. Pt would like to continue Dys 2 diet and agrees to further trials of more advanced trials. Pt participating in continued  ALFA testing, scoring 20% accuracy in Solving Daily Math Problems. Pt with limited awareness of errors during task, reports performance is close to baseline. Pt left in bed with alarm set and all needs met, cont ST POC.    Pain Pain Assessment Pain Scale: 0-10 Pain Score: 0-No pain  Therapy/Group: Individual Therapy  Dewaine Conger 07/31/2022, 11:04 AM

## 2022-07-31 NOTE — Progress Notes (Signed)
Physical Therapy Session Note  Patient Details  Name: Daniel [name] MRN: [id number] Date of Birth: 12-Nov-1946  Today's Date: 07/31/2022 PT Individual Time: 1107-1205 PT Individual Time Calculation (min): 58 min   Short Term Goals: Week 1:  PT Short Term Goal 1 (Week 1): Pt will perform supine<>sit with mod assist consistently PT Short Term Goal 2 (Week 1): Pt will perform sit<>stands using LRAD with mod assist PT Short Term Goal 3 (Week 1): Pt will perform bed<>chair transfers using LRAD with mod assist PT Short Term Goal 4 (Week 1): Pt will ambulate at least 75f with +2 mod assist  Skilled Therapeutic Interventions/Progress Updates:    Pt greeted supine in bed with HOB elevated and agreeable to therapy. No c/o pain.  Pt [facility] to and [facility] main therapy [facility].    Therapeutic activity:  HOB flattened for bed mobility and pt required mod assist for supine to sit transfer for trunk control, heavy right hand support on bed rail, and cues for sequencing.  Total assist for donning shoes for time management.  Squat pivot to the left from [facility]OB to [facility] mod assist for trunk control and L LE placement. Cues for hand placement and leaning direction for optimal clearance. Improvements noted in ability to push through R LE and UE.  Sit <> stand from [facility] hand rail x 5 with min/mod assist for balance and trunk control and max cuing for hand and foot placement, fwd lean, and pushing through hands and feet.  Static standing requires mod/max assist and max cues for correct posture due to pt laterally bending to the right at the trunk and hips leaning to the left.   Gait Training:  Wrapped L ankle and foot in ACE wrap for left foot drop prior to gait training.   Pre-gait tasks completed with R HR support and mirror feedback for midline posture correction.  Right step fwd and right step back x 10 required mod to max assist for balance,trunk control,  and L foot placement due to excessive toe out/hip ER. Max cuing for posture correction, weight shifting, and L knee TKE.   30 ft x 2 of amb with R HR support for first trial and R HH assist from another therapist with max assist for balance, weight shifting, hip and trunk control, and L knee blocking. Another therapist with WC follow for distance and safety.  Max cuing for sequencing, foot placement, weight shifting, L knee TKE and posture. Pt has decreased stability of the L knee resulting in frequent buckling that SPT blocked. Decreased L swing through ability with left toe out from dragging due to decreased LLE strength. Heavy right trunk lean with verbal, tactile, and visual cues for correction frequently. Pt able to follow cues in a timely manner, however max cuing required because carryover is minimal at this time. Seated rest breaks taken between trials due to fatigue.  [facility], call bell in reach, and all needs met.    Therapy Documentation Precautions:  Precautions Precautions: Fall, Other (comment) Precaution Comments: L hemiplegia, poor postural control Restrictions Weight Bearing Restrictions: No     Therapy/Group: Individual Therapy  AKayleen Memos9/10/2022, 12:15 PM

## 2022-07-31 NOTE — Progress Notes (Addendum)
Occupational Therapy Session Note  Patient Details  Name: Daniel [name] MRN: [id number] Date of Birth: [birth year]  Today's Date: 07/31/2022 OT Individual Time: 3546-5681 session 1 OT Individual Time Calculation (min): 74 min  Session 2: 1510-1540   Short Term Goals: Week 1:  OT Short Term Goal 1 (Week 1): Patient will sit at edge of bed with only intermittent min assist in preparation for a level surface transfer to wheelchair or commode OT Short Term Goal 2 (Week 1): Patient will don pull over shirt with min assist and verbal cueing after set up OT Short Term Goal 3 (Week 1): Patient will don pants over left foot with mod assistance OT Short Term Goal 4 (Week 1): Patient will maintain static sittin gbalance while on commode with only intermittent min assistance OT Short Term Goal 5 (Week 1): Patient will bathe upper body with min assist  Skilled Therapeutic Interventions/Progress Updates:  Session 1: Pt greeted supine in bed bed, pt agreeable to OT intervention. Pt completed supine>sit with MIN A, pt donned pants from EOB with overall MOD A needing assist to thread LLE but able to thread RLE. Pt needed mIN A to stand and assistance to pull pants up to waist line on L side in standing. Pt completed squat pivot to w/c to R side with MIN A. Pt transported to gym with total A where remainder of session focus on estim to LUE and AAROM to LUE.  1:1 NMES applied to L wrist extensors at the below settings:    Ratio 1:3 Rate 35 pps Waveform- Asymmetric Ramp 1.0 Pulse 300 Intensity- 29  Duration -  10 mins Good wrist extension achieved with some thumb abd/add.  Placed compliant cube in pts L hand during estim with pt instructed to grasp cube when stimulus was activated and try to drop cube when stimulus was idle.  No pain or adverse reactions noted after session.   Placed pts LUE on BZ board for AAROM with pt able to work on horizontal shoulder ABD/ADD with dycem used to keep hand in place,  pt did need support at L elbow to eliminate gravity. Pt also completed shoulder flexion/extension to < 90* only with AAROM from BZ board.   Pt transported back to room in w/c with total A, pt left supine in bed, bed alarm activated and all needs within reach.   Session 2: pt greeted supine in bed, pt agreeable to OT intervention. Pt completed supine>sit with MIN A. Worked on [employer] using UE ranger from [place]. Pt needed support at L elbow to complete shoulder horizontal ABD/ADD, shoulder circumduction R<>L and shoulder flexion/extension. Pt heavily reliant on the active assist from UE ranger to complete full ROM. Pt required MOD multimodal cues to keep trunk still during therex as pt wanting to compensate with trunk. Pt left supine in bed with bed alarm activated and all needs within reach.    Therapy Documentation Precautions:  Precautions Precautions: Fall, Other (comment) Precaution Comments: L hemiplegia, poor postural control Restrictions Weight Bearing Restrictions: No   Pain: no pain reported during either session   Therapy/Group: Individual Therapy  Corinne Ports [facility] 07/31/2022, 12:03 PM

## 2022-07-31 NOTE — Progress Notes (Addendum)
PROGRESS NOTE   Subjective/Complaints: Updated pt's daughter Levada Dy at her request via phone  No issues overnite   ROS- denies CP, SOB, N/V/D, abdominal pain, HA, +urinary incontinence  Objective:   No results found. No results for input(s): "WBC", "HGB", "HCT", "PLT" in the last 72 hours. No results for input(s): "NA", "K", "CL", "CO2", "GLUCOSE", "BUN", "CREATININE", "CALCIUM" in the last 72 hours.  Intake/Output Summary (Last 24 hours) at 07/31/2022 0740 Last data filed at 07/30/2022 1523 Gross per 24 hour  Intake --  Output 500 ml  Net -500 ml         Physical Exam: Vital Signs Blood pressure (!) 113/57, pulse 62, temperature 98.5 F (36.9 C), temperature source Oral, resp. rate 18, height '5\' 10"'$  (1.778 m), weight 80.9 kg, SpO2 95 %.   General: No acute distress Mood and affect are appropriate Heart: Regular rate and rhythm no rubs murmurs or extra sounds Lungs: Clear to auscultation, breathing unlabored, no rales or wheezes Abdomen: Positive bowel sounds, soft nontender to palpation, nondistended Extremities: No clubbing, cyanosis, or edema Skin: No evidence of breakdown, no evidence of rash   Neurologic: Cranial nerves II through XII intact, motor strength is 5/5 in RIght deltoid, bicep, tricep, grip, hip flexor, knee extensors, ankle dorsiflexor and plantar flexor 2- triceps o/w 0/5 LUE, 3- HF and KE , 2- hamstrings on Left o/w 0/5  Sensory exam normal sensation to light touch and proprioception in bilateral upper and lower extremities Tone- MAS 2/3 in Left pec Musculoskeletal: Full range of motion in all 4 extremities. No joint swelling, Left shoulder  pain - abd and ER , mild to moderate with ROM L wrist no effusion , erythema     Assessment/Plan: 1. Functional deficits which require 3+ hours per day of interdisciplinary therapy in a comprehensive inpatient rehab setting. Physiatrist is providing close  team supervision and 24 hour management of active medical problems listed below. Physiatrist and rehab team continue to assess barriers to discharge/monitor patient progress toward functional and medical goals  Care Tool:  Bathing    Body parts bathed by patient: Left arm, Chest, Abdomen, Front perineal area, Buttocks, Right upper leg, Left upper leg, Right lower leg, Left lower leg, Face   Body parts bathed by helper: Right arm     Bathing assist Assist Level: Maximal Assistance - Patient 24 - 49%     Upper Body Dressing/Undressing Upper body dressing   What is the patient wearing?: Pull over shirt    Upper body assist Assist Level: Minimal Assistance - Patient > 75%    Lower Body Dressing/Undressing Lower body dressing      What is the patient wearing?: Pants, Underwear/pull up     Lower body assist Assist for lower body dressing: Moderate Assistance - Patient 50 - 74%     Toileting Toileting    Toileting assist Assist for toileting: Maximal Assistance - Patient 25 - 49% Assistive Device Comment: bedpan   Transfers Chair/bed transfer  Transfers assist  Chair/bed transfer activity did not occur: Safety/medical concerns  Chair/bed transfer assist level: Moderate Assistance - Patient 50 - 74% (squat pivot to L side)     Locomotion  Ambulation   Ambulation assist   Ambulation activity did not occur: Safety/medical concerns  Assist level: 2 helpers (mod A of 1 and +2 w/c follow) Assistive device: Other (comment) (R hallway rail) Max distance: 61f   Walk 10 feet activity   Assist  Walk 10 feet activity did not occur: Safety/medical concerns  Assist level: Maximal Assistance - Patient 25 - 49% Assistive device: Other (comment) (right wall hand railing)   Walk 50 feet activity   Assist Walk 50 feet with 2 turns activity did not occur: Safety/medical concerns         Walk 150 feet activity   Assist Walk 150 feet activity did not occur:  Safety/medical concerns         Walk 10 feet on uneven surface  activity   Assist Walk 10 feet on uneven surfaces activity did not occur: Safety/medical concerns         Wheelchair     Assist Is the patient using a wheelchair?: Yes Type of Wheelchair: Manual    Wheelchair assist level: Dependent - Patient 0%      Wheelchair 50 feet with 2 turns activity    Assist        Assist Level: Dependent - Patient 0%   Wheelchair 150 feet activity     Assist      Assist Level: Dependent - Patient 0%   Blood pressure (!) 113/57, pulse 62, temperature 98.5 F (36.9 C), temperature source Oral, resp. rate 18, height '5\' 10"'$  (1.778 m), weight 80.9 kg, SpO2 95 %.  Medical Problem List and Plan: 1. 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  Antithrombotics: -DVT/anticoagulation:  Pharmaceutical: Lovenox             -antiplatelet therapy: Aspirin 81 mg daily and Plavix 75 mg day x3 weeks (end 9/22) then aspirin alone 3. Pain Management: Tylenol as needed 4. Mood/Behavior/Sleep: Provide emotional support             -antipsychotic agents: N/A 5. Neuropsych/cognition: This patient is capable of making decisions on his own behalf. 6. Skin/Wound Care: Routine skin checks 7. Fluids/Electrolytes/Nutrition: Routine in and outs with follow-up chemistries Discussed with patient importance of fluid intake to prevent dehydration, Will ask nursing to encourage fluids 8.  Permissive hypertension.  Decrease Losartan to '25mg'$ , continue this dose Vitals:   07/30/22 2008 07/31/22 0406  BP: (!) 119/54 (!) 113/57  Pulse: (!) 57 62  Resp: 15 18  Temp: 98.6 F (37 C) 98.5 F (36.9 C)  SpO2: 91% 95%    9.   Hyperlipidemia.  continue Crestor  10.  COnstipation add senna  -Order sorbitol   LOS: 7 days A FACE TO FACE EVALUATION WAS PERFORMED  ACharlett Blake9/10/2022, 7:40 AM

## 2022-08-01 NOTE — Progress Notes (Signed)
Physical Therapy Weekly Progress Note  Patient Details  Name: Daniel [name] MRN: [id number] Date of Birth: 1946/01/28  Beginning of progress report period: July 25, 2022 End of progress report period: August 01, 2022  Today's Date: 08/01/2022 PT Individual Time: 8563-1497 PT Individual Time Calculation (min): 30 min   Patient has met 4 of 4 short term goals.  Daniel [name] is progressing well with therapy evident by meeting all week 1 short term goals. He is currently requiring mod assist with bed mobility and sit to stand and squat pivot transfers. He is able to ambulate up to 6f with mod assist +2 with WC follow for distance and safety. Pt will benefit from continued skilled CIR level therapy to address continued impairments in ability to perform bed mobility, transfers, and ambulation with increased independence and safety, improve overall endurance, and both seated and standing dynamic and static balance. Pt will be returning home at d/c with 24/7 support from daughter.    Patient continues to demonstrate the following deficits muscle weakness and muscle paralysis, decreased cardiorespiratoy endurance, impaired timing and sequencing, unbalanced muscle activation, decreased coordination, and decreased motor planning, and decreased sitting balance, decreased standing balance, decreased postural control, and decreased balance strategies and therefore will continue to benefit from skilled PT intervention to increase functional independence with mobility.  Patient progressing toward long term goals..  Continue plan of care.  PT Short Term Goals Week 1:  PT Short Term Goal 1 (Week 1): Pt will perform supine<>sit with mod assist consistently PT Short Term Goal 1 - Progress (Week 1): Met PT Short Term Goal 2 (Week 1): Pt will perform sit<>stands using LRAD with mod assist PT Short Term Goal 2 - Progress (Week 1): Met PT Short Term Goal 3 (Week 1): Pt will perform bed<>chair transfers using  LRAD with mod assist PT Short Term Goal 3 - Progress (Week 1): Met PT Short Term Goal 4 (Week 1): Pt will ambulate at least 32fwith +2 mod assist PT Short Term Goal 4 - Progress (Week 1): Met Week 2:  PT Short Term Goal 1 (Week 2): Pt will perform supine<>sit with min assist consistently PT Short Term Goal 2 (Week 2): Pt will perform sit<> stand with min assist consistently PT Short Term Goal 3 (Week 2): Pt will perform squat pivot transfer with min assist consistently PT Short Term Goal 4 (Week 2): Pt will ambulate +6067fith mod assist using LRAD (+2 WC as needed for safety). PT Short Term Goal 5 (Week 2): Pt will initiate stair training.  Skilled Therapeutic Interventions/Progress Updates:  Ambulation/gait training;Community reintegration;DME/adaptive equipment instruction;Neuromuscular re-education;Psychosocial support;Stair training;UE/LE Strength taining/ROM;Wheelchair propulsion/positioning;Balance/vestibular training;Discharge planning;Functional electrical stimulation;Pain management;Skin care/wound management;Therapeutic Activities;UE/LE Coordination activities;Cognitive remediation/compensation;Disease management/prevention;Functional mobility training;Patient/family education;Splinting/orthotics;Therapeutic Exercise;Visual/perceptual remediation/compensation    Pt greeted supine in bed and agreeable to therapy. No c/o pain.   Mod assist for supine to sit for trunk and L LE and UE management. Total assist to don shoes for time. Squat pivot transfer to the left from EOB to WC [facility] mod assist for trunk control and left knee blocking. Heavy cues needed for trunk leaning, hand and LE placement, and sequencing.  WC transport down to main therapy gym for time management.  Attempted tall kneeling on mat table with mirror feedback to work on postural awareness, however pt was unable to maintain kneeling position due to L knee pain. Mod/max assist +2 needed for trunk and extremity management  with assisting pt into and out of kneeling position.  Sit <> stand from [facility] to wall railing x 3 with mod assist for trunk control and balance and cues for sequencing.   Pt amb x 30 ft with R UE support on hallway rail, heavy mod assist for trunk control, balance, and L LE mobility with + 2 WC follow for distance and safety. Heavy verbal and tactile cues given for weight shifting. Pt showed improved postural awareness today evident by moderate verbal and tactile cues needed to improve posture. Continued L knee blocking required due to frequent buckling. Conitnued difficulty with L LE step progression due to decreased L LE strength overall and SPT provided assistance with placement. Pt also performing short step lengths that need cues to correct.  WC transport back to room for time management.   Pt left seated in WC with seatbelt alarm on, L arm elevated, call bell in reach, and all needs met.   Therapy Documentation Precautions:  Precautions Precautions: Fall, Other (comment) Precaution Comments: L hemiplegia, poor postural control Restrictions Weight Bearing Restrictions: No    Therapy/Group: Individual Therapy  Kayleen Memos, SPT 08/01/2022, 12:41 PM

## 2022-08-01 NOTE — Progress Notes (Signed)
Occupational Therapy Weekly Progress Note  Patient Details  Name: LOUISE VICTORY MRN: [id number] Date of Birth: 1946/05/23  Beginning of progress report period: July 25, 2022 End of progress report period: August 01, 2022  Today's Date: 08/01/2022  Patient has met 4 of 4 short term goals. Pt currently requires MIN A for bathing tasks from shower level, pt completes stand pivot transfers into walkin shower and to toilet with use of grab bar with MODA. Pt completed UB Dressing with MIN A with great recall of hemi techniques and MOD A for LB dressing via sit>stand. Pt completes squat pivot transfers with MIN- MOD A and 3/3 toileting tasks with MODA. Pt continues to present with poor body awareness and LUE hemiplegia, no active movement noted in LUE at this point. Daughter has been present intermittently during OT sessions. Continue with POC.   Patient continues to demonstrate the following deficits: muscle weakness, decreased cardiorespiratoy endurance, impaired timing and sequencing, abnormal tone, unbalanced muscle activation, decreased coordination, and decreased motor planning, decreased midline orientation and dec body awareness, decreased attention, decreased awareness, decreased problem solving, decreased safety awareness, and delayed processing, and decreased standing balance, decreased postural control, hemiplegia, and decreased balance strategies and therefore will continue to benefit from skilled OT intervention to enhance overall performance with BADL and Reduce care partner burden.  Patient progressing toward long term goals..  Continue plan of care.  OT Short Term Goals Week 1:  OT Short Term Goal 1 (Week 1): Patient will sit at edge of bed with only intermittent min assist in preparation for a level surface transfer to wheelchair or commode OT Short Term Goal 1 - Progress (Week 1): Met OT Short Term Goal 2 (Week 1): Patient will don pull over shirt with min assist and verbal  cueing after set up OT Short Term Goal 2 - Progress (Week 1): Met OT Short Term Goal 3 (Week 1): Patient will don pants over left foot with mod assistance OT Short Term Goal 3 - Progress (Week 1): Met OT Short Term Goal 4 (Week 1): Patient will maintain static sittin gbalance while on commode with only intermittent min assistance OT Short Term Goal 4 - Progress (Week 1): Met OT Short Term Goal 5 (Week 1): Patient will bathe upper body with min assist Week 2:  OT Short Term Goal 1 (Week 2): pt will complete UB dressing from EOB with CGA and CGA for dynamic sitting balance OT Short Term Goal 2 (Week 2): pt will complete 1 part of LB dressing with MIN A OT Short Term Goal 3 (Week 2): pt will incorporate LUE into ADLs with < 3 cues during functional mobility and transfers   Therapy Documentation Precautions:  Precautions Precautions: Fall, Other (comment) Precaution Comments: L hemiplegia, poor postural control Restrictions Weight Bearing Restrictions: No      Therapy/Group: Individual Therapy  Corinne Ports [facility] 08/01/2022, 8:01 AM

## 2022-08-01 NOTE — Consult Note (Signed)
Neuropsychological Consultation   Patient:   Daniel [name]   DOB:   04/22/1946  MR Number:  [id number]  Location:  [facility] [address] [facility] [place] [id number] [place] [address] Dept: [place]: [phone]           Date of Service:   07/31/2022  Start Time:   2 PM End Time:   3 PM  Provider/Observer:  Ilean Skill, Psy.D.       Clinical Neuropsychologist       Billing Code/Service: [phone]  Chief Complaint:    Daniel [name] is a 76 year old male with a past history of hypertension and remote tobacco use.  Patient presented to the hospital on 07/19/2022 with left-sided weakness and dizziness of acute onset.  Elevated blood pressure was noted.  CT/MRI showed small acute infarct right centrum semiovale.  There was also mild to moderate chronic microvascular ischemic changes in white matter noted and in the right thalamic region.  Patient admitted to CIR due to left-sided weakness and decreased functional mobility and deemed to be in need of inpatient rehabilitation services.  Reason for Service:  Patient was referred for neuropsychological consultation due to coping and adjustment issues with recent stroke.  Below is the HPI for the current admission.  HPI: Daniel [name] is a 76 year old right-handed male with history of hypertension as well as former tobacco use.  Per chart review patient lives with his daughter.  1 level home 3 steps to entry.  Independent driving prior to admission.  Presented 07/19/2022 with left-sided weakness and dizziness of acute onset.  No nausea vomiting or fever reported.  Blood pressure 205/120.  CT/MRI showed small acute infarct right centrum semiovale.  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  MRA showed no large vessel occlusion.  Admission chemistry unremarkable except glucose 103, alcohol negative, urine drug  screen negative.  Echocardiogram with ejection fraction of 55 to 60% no wall motion abnormalities grade 1 diastolic dysfunction.  Neurology follow-up placed on aspirin as well as Plavix for CVA prophylaxis.  Hospital course increasing left-sided weakness 07/23/2022 with MRI completed showing increased size of acute infarct involving the right corona radiata and basal ganglia.  MRI findings discussed with neurology no specific changes recommended and remains on low-dose aspirin with Plavix x3 weeks followed by aspirin alone.  Placed on Lovenox for DVT prophylaxis.  Monitoring of permissive hypertension patient on losartan 50 mg prior to admission.  Tolerating a regular consistency diet.  Patient with nonspecific chest pain while eating breakfast troponin negative EKG mild sinus bradycardia.  Chest x-ray NAD.  Therapy evaluations completed due to patient's left-sided weakness and decreased functional mobility was admitted for a comprehensive rehab program  Current Status:  Patient was awake and alert as I entered the room.  Patient was oriented with good mental status and well aware of his motor deficits on his left side.  These deficits extend from left facial droop, motor weaknesses and left arm and left leg.  Patient with normal motor functioning on his right side.  Patient admits that it has been difficult to adjust to his loss of function and is concerned about how he will be doing going forward.  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  Patient has supportive family who is there  to help.  Behavioral Observation: Daniel [name]  presents as a 76 y.o.-year-old Right handed Caucasian Male who appeared his stated age. his dress was Appropriate and he was Well Groomed and his manners were Appropriate to the situation.  his participation was indicative of Appropriate and Attentive behaviors.  There were physical disabilities noted.   he displayed an appropriate level of cooperation and motivation.     Interactions:    Active Appropriate  Attention:   within normal limits and attention span and concentration were age appropriate  Memory:   within normal limits; recent and remote memory intact  Visuo-spatial:  not examined  Speech (Volume):  normal  Speech:   normal; normal  Thought Process:  Coherent and Relevant  Though Content:  WNL; not suicidal and not homicidal  Orientation:   person, place, time/date, and situation  Judgment:   Fair  Planning:   Fair  Affect:    Appropriate  Mood:    Anxious  Insight:   Fair  Intelligence:   low   Medical History:   Past Medical History:  Diagnosis Date   Hernia of fascia 2008   repair with many complications    Hypertension          Patient Active Problem List   Diagnosis Date Noted   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 ([place]) 07/24/2022   Dizziness 07/19/2022   CVA (cerebral vascular accident) ([place]) 07/19/2022   Prediabetes 07/19/2022   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  This is likely due to microvascular ischemic changes.  Family Med/Psych History:  Family History  Problem Relation Age of Onset   Hypertension Mother    Osteoporosis Mother    Thyroid disease Mother    Alcohol abuse Father    Depression Sister    Hypertension Sister    High Cholesterol Sister    Hypertension Brother    Impression/DX:  Daniel [name] is a 76 year old male with a past history of hypertension and remote tobacco use.  Patient presented to the hospital on 07/19/2022 with left-sided weakness and dizziness of acute onset.  Elevated blood pressure was noted.  CT/MRI showed small acute infarct right centrum semiovale.   There was also mild to moderate chronic microvascular ischemic changes in white matter noted and in the right thalamic region.  Patient admitted to CIR due to left-sided weakness and decreased functional mobility and deemed to be in need of inpatient rehabilitation services.  Patient was awake and alert as I entered the room.  Patient was oriented with good mental status and well aware of his motor deficits on his left side.  These deficits extend from left facial droop, motor weaknesses and left arm and left leg.  Patient with normal motor functioning on his right side.  Patient admits that it has been difficult to adjust to his loss of function and is concerned about how he will be doing going forward.  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  Patient has supportive family who is there to help.   Diagnosis:    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 ([place]) - Plan: Ambulatory referral to Neurology  Electronically Signed   _______________________ Ilean Skill, Psy.D. Clinical Neuropsychologist

## 2022-08-01 NOTE — Progress Notes (Signed)
Speech Language Pathology Weekly Progress and Session Note  Patient Details  Name: Daniel [name] MRN: [id number] Date of Birth: 1946-04-02  Beginning of progress report period: July 25, 2022 End of progress report period: August 01, 2022  Today's Date: 08/01/2022 SLP Individual Time: 1450-1535 SLP Individual Time Calculation (min): 45 min  Short Term Goals: Week 1: SLP Short Term Goal 1 (Week 1): Patient will consume current diet with minimal s/sx of aspiration and min A for implementation of safe swallowing precautions and strategies SLP Short Term Goal 1 - Progress (Week 1): Met SLP Short Term Goal 2 (Week 1): Patient will implement speech intelligiblity strategies at the sentence level with min A in order to achieve >75% intelligibility SLP Short Term Goal 2 - Progress (Week 1): Met SLP Short Term Goal 3 (Week 1): Patient will participate in further cognitive-linguistic evaluation for further development of ST POC SLP Short Term Goal 3 - Progress (Week 1): Met SLP Short Term Goal 4 (Week 1): Patient will complete mildly complex problem solving tasks with min-to-mod A verbal/visual cues SLP Short Term Goal 4 - Progress (Week 1): Met SLP Short Term Goal 5 (Week 1): Patient will utilize external memory aids to recall novel information with min A verbal cues SLP Short Term Goal 5 - Progress (Week 1): Met SLP Short Term Goal 6 (Week 1): Pt will sustained attention to functional tasks for 10 min duration given min A verbal redirection cues SLP Short Term Goal 6 - Progress (Week 1): Met  New Short Term Goals: Week 2: SLP Short Term Goal 1 (Week 2): Patient will consume current diet with minimal s/sx of aspiration and mod I for implementation of safe swallowing precautions and strategies SLP Short Term Goal 2 (Week 2): Patient will implement speech intelligiblity strategies at the sentence level with mod I in order to achieve >90% intelligibility SLP Short Term Goal 3 (Week 2): Patient  will complete mildly complex problem solving tasks with min verbal/visual cues SLP Short Term Goal 4 (Week 2): Patient will utilize external memory aids to recall novel information with sup A verbal cues SLP Short Term Goal 5 (Week 2): Pt will sustain attention to functional tasks for 10 min duration given sup A verbal redirection cues  Weekly Progress Updates: Pt has demonstrated excellent gains, as evident by meeting 6 out of 6 short-term goals this reporting period. Pt is currently completing cognitive tasks with at least min A verbal cues in regards to functional problem solving, attention, awareness, and memory. Pt is implementing speech intelligibility strategies at the sup-to-mod I level to achieve >90% intelligibility. Pt/daughter feel pt is near cognitive and speech baseline. Pt is currently consuming a dysphagia 2 diet and thin liquids per pt's preference due to anxiousness/fear of choking. Per bedside assessment and PO trials, pt presents with mild oral deficits and perceived as mildly at risk for aspiration. Pt would be appropriate for consideration of diet advancement to dysphagia 3 textures per performance with recent PO trials, however pt continues to indicate preference for dysphagia 2 textures. Pt and family education ongoing.  Continue to recommend ST intervention during CIR admission. Recommend change in treatment intensity to 1-3x/week due to pt being near baseline.   Intensity: Minumum of 1-2 x/day, 30 to 90 minutes Frequency: 1 to 3 out of 7 days Duration/Length of Stay: 9/27 Treatment/Interventions: Cognitive remediation/compensation;Internal/external aids;Speech/Language facilitation;Functional tasks;Dysphagia/aspiration precaution training;Patient/family education;Therapeutic Activities;Therapeutic Exercise   Daily Session Skilled Therapeutic Interventions: Skilled ST treatment focused on cognitive goals. SLP  facilitated session by providing +2 assist for peri care at bed level  d/t incontinence of bowel. Pt performed bed mobility by rolling side-to-side with min A. SLP facilitated functional discussion regarding anticipatory awareness of needs currently and at discharge. Pt exhibited appropriate awareness of deficits, as well as realistic anticipation of needs and recovery. Pt engaged in discussion regarding medication management. Pt was aware he took a blood pressure medication at prior level but unable to recall name. Pt engaged in brief problem solving scenarios pertaining to medications with min A verbal cues for reasoning and mental flexibility. Pt sustained attention throughout session for 10 minute intervals with sup-to-min A verbal redirection cues for topic maintenance. Pt implemented speech intelligibility strategies at the mod I level to achieve >90% intelligibility by reducing rate of speech with improved self monitoring and correction.  Patient was left in bed with alarm activated and immediate needs within reach at end of session. Continue per current plan of care.       General    Pain  None/denied  Therapy/Group: Individual Therapy  Patty Sermons 08/01/2022, 4:40 PM

## 2022-08-01 NOTE — Patient Care Conference (Signed)
Inpatient RehabilitationTeam Conference and Plan of Care Update Date: 08/01/2022   Time: 10:52 AM    Patient Name: Daniel [name]      Medical Record Number: [id number]  Date of Birth: 1946-02-16 Sex: Male         Room/Bed: 4M10C/4M10C-01 Payor Info: Payor: [employer] MEDICARE / Plan: [employer] MEDICARE HMO/PPO / Product Type: *No Product type* /    Admit Date/Time:  07/24/2022 10:57 PM  Primary Diagnosis:  CVA (cerebral vascular accident) [facility])  Hospital Problems: Principal Problem:   CVA (cerebral vascular accident) [facility]) Active Problems:   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 Alysia Penna Social Worker Present: Erlene Quan, BSW Nurse Present: Dorien Chihuahua, RN PT Present: Page Spiro, PT OT Present: Willeen Cass, OT;Mary Jabier Gauss, COTA SLP Present: Sherren Kerns, SLP PPS Coordinator present : Gunnar Fusi, SLP     Current Status/Progress Goal Weekly Team Focus  Bowel/Bladder   continent of b/b. LBM 9/12  remain continent of b/b  remain continent of b/b   Swallow/Nutrition/ Hydration   Dys 2 with no significant difficulty, Supervision  Dys 3-regular, Supervision  trials of Dys 3 solids, pt fearful of upgraded textures   ADL's   pt completes bathing from shower level with MINA, MIN A for UB dressing, MOD A for LB dressing via sit>stand, MINA for squat pivots to w/c. pt able to stand pivot to shower seat and toilet with MOD A with grab bars. 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  supervision goals  BADL reeducation, LUE AAROM, LUE e stim, LUE NMR   Mobility   bed mobility mod A - difficulty with sequencing, squat pivot and sit to stand transfers mod/max A, gait +2 max A for balance, L LE mobiliy, and trunk control. Severe difficult with sequencing during gait and bed mobility, L knee blocking  required due to buckling, heavy R UE support on wall rail during gait, right lateral lean of trunk.  dynamic sitting SPV, dynamic standing min A, bed mobility SPV, transfers CGA, gait CGA 124f with LRAD, stairs min A 4 steps B HRs  bed mobility, transfer training, gait training, postural awareness, L LE strengthening, stair training.   Communication   Supervision-Mod I for speech intelligibility  Supervision  BOSS strategies for speech intelligibility   Safety/Cognition/ Behavioral Observations  Min A  Supervision A  memory, problem solving, awareness, attention   Pain   denies pain  remain pain free  assess pain qshift   Skin   Skin intact. scabbed abrasions on hands and arms. ecchymosis bilateral on legs and arms  Keep skin intact and free from skin breakdown  Assess skin qshift     Discharge Planning:  Discharging home with assistance from daughter. 24/7. Family working on ramp   Team Discussion: Patient with significant pectoral tone; Botox recommended per MD for tightness.  Progress limited by lack of awareness of pelvis in space, proprioception.  Also has a fear of choking with attention deficits, poor awareness and problem solving issues.  Patient on target to meet rehab goals: yes, currently needs min  assist for bathing and upper body dressing and mod assist for lower body care. Completes squat pivot to the right. Stand pivot  and sit - stand with mod assist. Needs supervision - min assit for basic tasks and support for IADLs. Goals for discharge set for supervision  overall.  *See Care Plan and progress notes for long and short-term goals.   Revisions to Treatment Plan:  SLP downgraded goals to supervision   Teaching Needs: Safety, medications, dietary modifications, transfers, toileting, etc.  Current Barriers to Discharge: Decreased caregiver support and Home enviroment access/layout  Possible Resolutions to Barriers: Family education HH follow up services DME: [facility]       Medical Summary Current Status: Increasing Left pectoralis tone, pain with resting hand splint  Barriers to Discharge: Medical stability   Possible Resolutions to Barriers/Weekly Focus: Dysphagia with ongoing therapy , fear of choking   Continued Need for Acute Rehabilitation Level of Care: The patient requires daily medical management by a physician with specialized training in physical medicine and rehabilitation for the following reasons: Direction of a multidisciplinary physical rehabilitation program to maximize functional independence : Yes Medical management of patient stability for increased activity during participation in an intensive rehabilitation regime.: Yes Analysis of laboratory values and/or radiology reports with any subsequent need for medication adjustment and/or medical intervention. : Yes   I attest that I was present, lead the team conference, and concur with the assessment and plan of the team.   Dorien Chihuahua B 08/01/2022, 1:49 PM

## 2022-08-01 NOTE — Progress Notes (Signed)
Physical Therapy Session Note  Patient Details  Name: Daniel [name] MRN: [id number] Date of Birth: Feb 24, 1946  Today's Date: 08/01/2022 PT Individual Time: 5573-2202 PT Individual Time Calculation (min): 58 min   Short Term Goals: Week 2:   Week 1:  PT Short Term Goal 1 (Week 1): Pt will perform supine<>sit with mod assist consistently PT Short Term Goal 2 (Week 1): Pt will perform sit<>stands using LRAD with mod assist PT Short Term Goal 3 (Week 1): Pt will perform bed<>chair transfers using LRAD with mod assist PT Short Term Goal 4 (Week 1): Pt will ambulate at least 70f with +2 mod assist  Skilled Therapeutic Interventions/Progress Updates: Pt presented in w/c agreeable to therapy. Pt noted to be in sacral sit and once PTA removed tray had pt lean forward and with PTA blocking L knee pt was able to scoot back in w/c. Nsg arrived to administer meds and once completed pt transported to rehab gym hallway for time management. PTA donned ace bandage for DF assist and pt was able to scoot forward in w/c with minA on L hip and mod multimodal cues for weight shifting. With use of wall rail pt stood with modA and ambulated 311fwith modA. Pt was able to demonstrate initiation of hip flexion to advance LLE with PTA providing facilitation for weight shifting over L side and blocking L knee to decrease buckling. Pt did require max cues to engage LLE  into TKE but did note some hyperextension. After seated rest pt performed same activity for an additional 25t in same manner and PTA providing additional facilitation for hip rotation when advancing LLE. Pt with noted fatigue after second bout but pleased with what he did today and stated felt better than yesterday. Pt then propelled inside rehab gym with PTA providing minA and max cues initially for turning and hemi-technique. Pt performed anterior scooting in same manner as prior and performed squat pivot to high/low mat to R with modA. With mirror feedback pt  was able to reposition himself to neutral with minA, then using mirror performed lateral scoots to R requiring minA. PTA providing cues for hand placement, slight lean to L to offload R hip and cues to push through BLE. Pt worked on attaining and maintaining midline with mirror feedback before working on scooting to L using same cues with minA. Pt then performed squat pivot to L with modA to return to w/c. Pt was able to propel back to room with supervision and min intermittent verbal cues to maintain straight trajectory but did not require cues for hemi technique. Pt requesting to return to bed once in room. Performed squat pivot to R with light modA. Transferred sit to supine (via sidelying) with modA. Two nurses arrived for skin check and pt was able to roll to L with CGA and to R with modA to allow RN's to check sacral area. Pt returned to supine and pillows placed under pt's LUE. Pt positioned to comfort and left with bed alarm on, call bell within reach and needs met.      Therapy Documentation Precautions:  Precautions Precautions: Fall, Other (comment) Precaution Comments: L hemiplegia, poor postural control Restrictions Weight Bearing Restrictions: No General:   Vital Signs:  Pain:   Mobility:   Locomotion :    Trunk/Postural Assessment :    Balance:   Exercises:   Other Treatments:      Therapy/Group: Individual Therapy  Haile Bosler 08/01/2022, 12:28 PM

## 2022-08-01 NOTE — Progress Notes (Signed)
Occupational Therapy Session Note  Patient Details  Name: Daniel [name] MRN: [id number] Date of Birth: 01/09/1946  Today's Date: 08/01/2022 OT Individual Time: 8676-1950 session 1 OT Individual Time Calculation (min): 30 min  Session 2: 1300-1345   Short Term Goals: Week 1:  OT Short Term Goal 1 (Week 1): Patient will sit at edge of bed with only intermittent min assist in preparation for a level surface transfer to wheelchair or commode OT Short Term Goal 1 - Progress (Week 1): Met OT Short Term Goal 2 (Week 1): Patient will don pull over shirt with min assist and verbal cueing after set up OT Short Term Goal 2 - Progress (Week 1): Met OT Short Term Goal 3 (Week 1): Patient will don pants over left foot with mod assistance OT Short Term Goal 3 - Progress (Week 1): Met OT Short Term Goal 4 (Week 1): Patient will maintain static sittin gbalance while on commode with only intermittent min assistance OT Short Term Goal 4 - Progress (Week 1): Met OT Short Term Goal 5 (Week 1): Patient will bathe upper body with min assist  Skilled Therapeutic Interventions/Progress Updates:  Session 1: Pt greeted supine in bed, pt agreeable to OT intervention. Pt completed supine>sit with MINA. Pt completed UB bathing from EOB with MINA to wash underneath LUE. Pt donned shirt with MINA. Pt donned pants with MOD A needing assist to thread LLE and pull pants up to waist line on L side. Pt completed sit>stand holding on to back of recliner with MINA. Pt completed lateral scoot to w/c to R side with MINA. Pt completed seated grooming tasks at sink with MIN A to set- up oral care.  Pt left seated in w/c with alarm belt activated and all needs within reach, MD also present.  Session 2:  Pt greeted supine in bed needing assist opening meal tray items, alerted NT that pt needs to be set- up assist for meals. Pt set- up for lunch from up right position in bed with pt agreeable to work on estim to LUE while pt ate lunch.    1:1 NMES applied to L digit flexors at the below settings:    Ratio 1:3 Rate 35 pps Waveform- Asymmetric Ramp 1.0 Pulse 300 Intensity- 12 Duration -   10 mins  Pt with full wrist flexion able to achieve digit flexion in 3rd and 4th fingers to work on grasping compliant cube and dropping cube when channel idle. No adverse skin reactions or pain noted during or after treatment.  Pt left supine in bed with bed alarm activated and all needs within reach.    Therapy Documentation Precautions:  Precautions Precautions: Fall, Other (comment) Precaution Comments: L hemiplegia, poor postural control Restrictions Weight Bearing Restrictions: No   Pain: no pain reported during either session   Therapy/Group: Individual Therapy  Precious Haws 08/01/2022, 12:12 PM

## 2022-08-01 NOTE — Plan of Care (Signed)
Goals advanced due to steady progress  Problem: RH Swallowing Goal: LTG Patient will consume least restrictive diet using compensatory strategies with assistance (SLP) Description: LTG:  Patient will consume least restrictive diet using compensatory strategies with assistance (SLP) Flowsheets (Taken 08/01/2022 1610) LTG: Pt Patient will consume least restrictive diet using compensatory strategies with assistance of (SLP): Modified Independent   Problem: RH Expression Communication Goal: LTG Patient will increase speech intelligibility (SLP) Description: LTG: Patient will increase speech intelligibility at word/phrase/conversation level with cues, % of the time (SLP) Flowsheets (Taken 08/01/2022 1610) LTG: Patient will increase speech intelligibility (SLP): Modified Independent

## 2022-08-01 NOTE — Progress Notes (Signed)
PROGRESS NOTE   Subjective/Complaints:  No issues overnite except could not tolerate resting hand splint due to thumb pain   ROS- denies CP, SOB, N/V/D, abdominal pain, HA, +urinary incontinence  Objective:   No results found. No results for input(s): "WBC", "HGB", "HCT", "PLT" in the last 72 hours. No results for input(s): "NA", "K", "CL", "CO2", "GLUCOSE", "BUN", "CREATININE", "CALCIUM" in the last 72 hours.  Intake/Output Summary (Last 24 hours) at 08/01/2022 0833 Last data filed at 08/01/2022 0807 Gross per 24 hour  Intake 920 ml  Output --  Net 920 ml         Physical Exam: Vital Signs Blood pressure 129/72, pulse (!) 56, temperature 98.1 F (36.7 C), resp. rate 17, height '5\' 10"'$  (1.778 m), weight 79.8 kg, SpO2 94 %.   General: No acute distress Mood and affect are appropriate Heart: Regular rate and rhythm no rubs murmurs or extra sounds Lungs: Clear to auscultation, breathing unlabored, no rales or wheezes Abdomen: Positive bowel sounds, soft nontender to palpation, nondistended Extremities: No clubbing, cyanosis, or edema Skin: No evidence of breakdown, no evidence of rash   Neurologic: Cranial nerves II through XII intact, motor strength is 5/5 in RIght deltoid, bicep, tricep, grip, hip flexor, knee extensors, ankle dorsiflexor and plantar flexor 2- triceps o/w 0/5 LUE, 3- HF and KE , 2- hamstrings on Left o/w 0/5  Sensory exam normal sensation to light touch and proprioception in bilateral upper and lower extremities Tone- MAS 3 in Left pec-mildly increased vs prior  Musculoskeletal: Full range of motion in all 4 extremities. No joint swelling, Left shoulder  pain - abd and ER , mild to moderate with ROM L wrist no effusion , erythema     Assessment/Plan: 1. Functional deficits which require 3+ hours per day of interdisciplinary therapy in a comprehensive inpatient rehab setting. Physiatrist is  providing close team supervision and 24 hour management of active medical problems listed below. Physiatrist and rehab team continue to assess barriers to discharge/monitor patient progress toward functional and medical goals  Care Tool:  Bathing    Body parts bathed by patient: Left arm, Chest, Abdomen, Front perineal area, Buttocks, Right upper leg, Left upper leg, Right lower leg, Left lower leg, Face   Body parts bathed by helper: Right arm     Bathing assist Assist Level: Minimal Assistance - Patient > 75%     Upper Body Dressing/Undressing Upper body dressing   What is the patient wearing?: Pull over shirt    Upper body assist Assist Level: Minimal Assistance - Patient > 75%    Lower Body Dressing/Undressing Lower body dressing      What is the patient wearing?: Pants, Underwear/pull up     Lower body assist Assist for lower body dressing: Moderate Assistance - Patient 50 - 74%     Toileting Toileting    Toileting assist Assist for toileting: Maximal Assistance - Patient 25 - 49% Assistive Device Comment: bedpan   Transfers Chair/bed transfer  Transfers assist  Chair/bed transfer activity did not occur: Safety/medical concerns  Chair/bed transfer assist level: Minimal Assistance - Patient > 75% (squat pivot to R side)  Locomotion Ambulation   Ambulation assist   Ambulation activity did not occur: Safety/medical concerns  Assist level: 2 helpers (mod A of 1 and +2 w/c follow) Assistive device: Other (comment) (R hallway rail) Max distance: 52f   Walk 10 feet activity   Assist  Walk 10 feet activity did not occur: Safety/medical concerns  Assist level: Maximal Assistance - Patient 25 - 49% Assistive device: Other (comment) (right wall hand railing)   Walk 50 feet activity   Assist Walk 50 feet with 2 turns activity did not occur: Safety/medical concerns         Walk 150 feet activity   Assist Walk 150 feet activity did not occur:  Safety/medical concerns         Walk 10 feet on uneven surface  activity   Assist Walk 10 feet on uneven surfaces activity did not occur: Safety/medical concerns         Wheelchair     Assist Is the patient using a wheelchair?: Yes Type of Wheelchair: Manual    Wheelchair assist level: Dependent - Patient 0%      Wheelchair 50 feet with 2 turns activity    Assist        Assist Level: Dependent - Patient 0%   Wheelchair 150 feet activity     Assist      Assist Level: Dependent - Patient 0%   Blood pressure 129/72, pulse (!) 56, temperature 98.1 F (36.7 C), resp. rate 17, height '5\' 10"'$  (1.778 m), weight 79.8 kg, SpO2 94 %.  Medical Problem List and Plan: 1. Functional deficits secondary to right CR infarction with progressive left-sided weakness             -patient may  shower             -ELOS/Goals: 8-10 days supervision- team conf in am   Increasing tone Left pectoralis, discussed with OT, will do ROM for now consider Botox if pain/tone  worsens - discuss with team and daughter   Continue CIR 2.  Antithrombotics: -DVT/anticoagulation:  Pharmaceutical: Lovenox             -antiplatelet therapy: Aspirin 81 mg daily and Plavix 75 mg day x3 weeks (end 9/22) then aspirin alone 3. Pain Management: Tylenol as needed 4. Mood/Behavior/Sleep: Provide emotional support             -antipsychotic agents: N/A 5. Neuropsych/cognition: This patient is capable of making decisions on his own behalf. 6. Skin/Wound Care: Routine skin checks 7. Fluids/Electrolytes/Nutrition: Routine in and outs with follow-up chemistries Discussed with patient importance of fluid intake to prevent dehydration, Will ask nursing to encourage fluids 8.  Permissive hypertension.  Decrease Losartan to '25mg'$ , continue this dose Vitals:   07/31/22 1836 08/01/22 0500  BP: (!) 143/79 129/72  Pulse: (!) 58 (!) 56  Resp: 18 17  Temp: 98.1 F (36.7 C) 98.1 F (36.7 C)  SpO2: 97% 94%     9.  Hyperlipidemia.  continue Crestor  10.  COnstipation add senna  -Order sorbitol   LOS: 8 days A FACE TO FACE EVALUATION WAS PERFORMED  ACharlett Blake9/13/2023, 8:33 AM

## 2022-08-01 NOTE — Progress Notes (Signed)
Patient ID: Daniel [name], male   DOB: 1946/05/23, 76 y.o.   MRN: [id number]  Team Conference Report to Patient/Family  Team Conference discussion was reviewed with the patient and caregiver, including goals, any changes in plan of care and target discharge date.  Patient and caregiver express understanding and are in agreement.  The patient has a target discharge date of 08/15/22.  Sw met with patient and called patient daughter at bedside. Provide patient and daughter with conference updates. Both pleased, daughter requesting DME reccs ASAP. No additional questions or concerns.  Dyanne Iha 08/01/2022, 1:25 PM

## 2022-08-02 NOTE — Progress Notes (Addendum)
PROGRESS NOTE   Subjective/Complaints:  Updated daughter on medical issues after team conf yesterday  Left shoulder pain, increasing tone as per OT   ROS- denies CP, SOB, N/V/D, abdominal pain, HA, +urinary incontinence  Objective:   No results found. No results for input(s): "WBC", "HGB", "HCT", "PLT" in the last 72 hours. No results for input(s): "NA", "K", "CL", "CO2", "GLUCOSE", "BUN", "CREATININE", "CALCIUM" in the last 72 hours.  Intake/Output Summary (Last 24 hours) at 08/02/2022 0738 Last data filed at 08/02/2022 0725 Gross per 24 hour  Intake 894 ml  Output 200 ml  Net 694 ml         Physical Exam: Vital Signs Blood pressure (!) 101/55, pulse (!) 57, temperature 98.3 F (36.8 C), temperature source Oral, resp. rate 18, height '5\' 10"'$  (1.778 m), weight 79.8 kg, SpO2 92 %.   General: No acute distress Mood and affect are appropriate Heart: Regular rate and rhythm no rubs murmurs or extra sounds Lungs: Clear to auscultation, breathing unlabored, no rales or wheezes Abdomen: Positive bowel sounds, soft nontender to palpation, nondistended Extremities: No clubbing, cyanosis, or edema Skin: No evidence of breakdown, no evidence of rash   Neurologic: Cranial nerves II through XII intact, motor strength is 5/5 in RIght deltoid, bicep, tricep, grip, hip flexor, knee extensors, ankle dorsiflexor and plantar flexor 2- triceps o/w 0/5 LUE, 3- HF and KE , 2- hamstrings on Left o/w 0/5  Sensory exam normal sensation to light touch and proprioception in bilateral upper and lower extremities Tone- MAS 3 in Left pec-mildly increased vs prior  Musculoskeletal: Full range of motion in all 4 extremities. No joint swelling, Left shoulder  pain - abd and ER , mild to moderate with ROM L wrist no effusion , erythema     Assessment/Plan: 1. Functional deficits which require 3+ hours per day of interdisciplinary therapy in a  comprehensive inpatient rehab setting. Physiatrist is providing close team supervision and 24 hour management of active medical problems listed below. Physiatrist and rehab team continue to assess barriers to discharge/monitor patient progress toward functional and medical goals  Care Tool:  Bathing    Body parts bathed by patient: Left arm, Chest, Abdomen, Front perineal area, Buttocks, Right upper leg, Left upper leg, Right lower leg, Left lower leg, Face   Body parts bathed by helper: Right arm     Bathing assist Assist Level: Minimal Assistance - Patient > 75%     Upper Body Dressing/Undressing Upper body dressing   What is the patient wearing?: Pull over shirt    Upper body assist Assist Level: Minimal Assistance - Patient > 75%    Lower Body Dressing/Undressing Lower body dressing      What is the patient wearing?: Pants, Underwear/pull up     Lower body assist Assist for lower body dressing: Moderate Assistance - Patient 50 - 74%     Toileting Toileting    Toileting assist Assist for toileting: Maximal Assistance - Patient 25 - 49% Assistive Device Comment: bedpan   Transfers Chair/bed transfer  Transfers assist  Chair/bed transfer activity did not occur: Safety/medical concerns  Chair/bed transfer assist level: Minimal Assistance - Patient > 75% (  squat pivot to R side)     Locomotion Ambulation   Ambulation assist   Ambulation activity did not occur: Safety/medical concerns  Assist level: 2 helpers (mod A of 1 and +2 w/c follow) Assistive device: Other (comment) (R hallway rail) Max distance: 78f   Walk 10 feet activity   Assist  Walk 10 feet activity did not occur: Safety/medical concerns  Assist level: Maximal Assistance - Patient 25 - 49% Assistive device: Other (comment) (right wall hand railing)   Walk 50 feet activity   Assist Walk 50 feet with 2 turns activity did not occur: Safety/medical concerns         Walk 150 feet  activity   Assist Walk 150 feet activity did not occur: Safety/medical concerns         Walk 10 feet on uneven surface  activity   Assist Walk 10 feet on uneven surfaces activity did not occur: Safety/medical concerns         Wheelchair     Assist Is the patient using a wheelchair?: Yes Type of Wheelchair: Manual    Wheelchair assist level: Dependent - Patient 0%      Wheelchair 50 feet with 2 turns activity    Assist        Assist Level: Dependent - Patient 0%   Wheelchair 150 feet activity     Assist      Assist Level: Dependent - Patient 0%   Blood pressure (!) 101/55, pulse (!) 57, temperature 98.3 F (36.8 C), temperature source Oral, resp. rate 18, height '5\' 10"'$  (1.778 m), weight 79.8 kg, SpO2 92 %.  Medical Problem List and Plan: 1. 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  Antithrombotics: -DVT/anticoagulation:  Pharmaceutical: Lovenox             -antiplatelet therapy: Aspirin 81 mg daily and Plavix 75 mg day x3 weeks (end 9/22) then aspirin alone 3. Pain Management: Tylenol as needed 4. Mood/Behavior/Sleep: Provide emotional support             -antipsychotic agents: N/A 5. Neuropsych/cognition: This patient is capable of making decisions on his own behalf. 6. Skin/Wound Care: Routine skin checks 7. Fluids/Electrolytes/Nutrition: Routine in and outs with follow-up chemistries Discussed with patient importance of fluid intake to prevent dehydration, Will ask nursing to encourage fluids 8.  Permissive hypertension.  Decrease Losartan to '25mg'$ , continue this dose Vitals:   08/01/22 1936 08/02/22 0505  BP: 132/81 (!) 101/55  Pulse: 71 (!) 57  Resp: 20 18  Temp: 98.3  F (36.8 C) 98.3 F (36.8 C)  SpO2: 94% 92%    9.  Hyperlipidemia.  continue Crestor  10.  COnstipation add senna  -Order sorbitol  11.  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 This includes bleeding, bruising, infection, excessive weakness, or  medication side effects. Discussed with pt and daughter  Needle: 25g 1.5" needle  Number of units per muscle Pectoralis50U x 3 Waste 50U  All injections were done after obtaining appropriate EMG activity and after negative drawback for blood. The patient tolerated the procedure well. LOS: 9 days A FACE TO FACE EVALUATION WAS PERFORMED  Charlett Blake 08/02/2022, 7:38 AM

## 2022-08-02 NOTE — Progress Notes (Signed)
Speech Language Pathology Daily Session Note  Patient Details  Name: Daniel [name] MRN: [id number] Date of Birth: March 10, 1946  Today's Date: 08/02/2022 SLP Individual Time: 1400-1445 SLP Individual Time Calculation (min): 45 min  Short Term Goals: Week 2: SLP Short Term Goal 1 (Week 2): Patient will consume current diet with minimal s/sx of aspiration and mod I for implementation of safe swallowing precautions and strategies SLP Short Term Goal 2 (Week 2): Patient will implement speech intelligiblity strategies at the sentence level with mod I in order to achieve >90% intelligibility SLP Short Term Goal 3 (Week 2): Patient will complete mildly complex problem solving tasks with min verbal/visual cues SLP Short Term Goal 4 (Week 2): Patient will utilize external memory aids to recall novel information with sup A verbal cues SLP Short Term Goal 5 (Week 2): Pt will sustain attention to functional tasks for 10 min duration given sup A verbal redirection cues  Skilled Therapeutic Interventions: Skilled ST treatment focused on cognitive goals. SLP facilitated the session with a novel card game targeting memory, attention, and reasoning skills. Pt completed task with min A fading to supervision A verbal cues to monitor and correct errors, and for utilization of external memory aid for recall of task rules and procedures. Pt sustained attention for this task for 25 minute duration with verbal redirection x1. Pt implemented speech intelligibility strategies in a conversational context with mod I to achieve >90% intelligibility. Patient was left in bed with alarm activated and immediate needs within reach at end of session. Continue per current plan of care.      Pain  None/denied  Therapy/Group: Individual Therapy  Patty Sermons 08/02/2022, 2:45 PM

## 2022-08-02 NOTE — Progress Notes (Signed)
Physical Therapy Session Note  Patient Details  Name: Daniel [name] MRN: [id number] Date of Birth: 1946/09/08  Today's Date: 08/02/2022 PT Individual Time: 6761-9509 PT Individual Time Calculation (min): 72 min   Short Term Goals: Week 1:  PT Short Term Goal 1 (Week 1): Pt will perform supine<>sit with mod assist consistently PT Short Term Goal 1 - Progress (Week 1): Met PT Short Term Goal 2 (Week 1): Pt will perform sit<>stands using LRAD with mod assist PT Short Term Goal 2 - Progress (Week 1): Met PT Short Term Goal 3 (Week 1): Pt will perform bed<>chair transfers using LRAD with mod assist PT Short Term Goal 3 - Progress (Week 1): Met PT Short Term Goal 4 (Week 1): Pt will ambulate at least 19f with +2 mod assist PT Short Term Goal 4 - Progress (Week 1): Met Week 2:  PT Short Term Goal 1 (Week 2): Pt will perform supine<>sit with min assist consistently PT Short Term Goal 2 (Week 2): Pt will perform sit<> stand with min assist consistently PT Short Term Goal 3 (Week 2): Pt will perform squat pivot transfer with min assist consistently PT Short Term Goal 4 (Week 2): Pt will ambulate +628fwith mod assist using LRAD (+2 WC as needed for safety). PT Short Term Goal 5 (Week 2): Pt will initiate stair training.  Skilled Therapeutic Interventions/Progress Updates:    Pt greeted supine in bed with HOB elevated and agreeable to therapy. No c/o pain.   Flattened HOB for bed mobility and pt requires mod assist for trunk control and L UE and LE management and use of R bed rail. Min/mod assist needed sitting EOB due to posterior and left lean with cues given to lean fwd to avoid lying back down on bed. Total assist to don shoes for time management.  Blocked practice of squat pivot from EOB <> WC x4 with heavy mod assist required initially for trunk management. SPT blocking L knee due to potential for instability with each transfer. Cues given for correct sequencing, leaning direction, limb  placement, and use of extremities to push and pt able to progress to light mod assist due to increased use of limbs.   [facility] and [facility] main [facility].   Sit <>stand from WC x 4 with min/mod assist between ambulation trials with cues for fwd trunk lean.  Static standing requires mod/max assist for trunk management due to shoulders shifting right and pelvis shifting left. Pt able to correct, but only for a minimal amount of time. Cues needed to decrease trunk lean, weight shift, and perform L TKE.   Gait training performed with pt amb [id number] with rest breaks in between due to fatigue. Pt required + 2 assist with one therapist providing moderate assist via R arm over therapist shoulder and SPT providing L knee blocking due to L knee buckling or hyperextension and min/mod assist for L leg swing progression and L foot placement to increase step width and decrease toe out. Heavy tactile and verbal cues given to weight shift onto weightbearing LE, L TKE during stance, correct posture due to pt falling into a fwd flexed posture though out ambulation. Pt able to progress and place L foot with improved strength and accuracy today as compared to previous sessions   Step tap attempted with mirror feedback for improved weight shifting performance and pt required +2 mod/max assist for balance, trunk management, and L LE blocking.  Pt able to perform x 3 step taps with the right LE on a 5 inch step before reporting pain in L elbow and need to sit down.   Pt left seated in recliner with chair alarm on, L UE supported, call bell in reach, and all needs met.   Therapy Documentation Precautions:  Precautions Precautions: Fall, Other (comment) Precaution Comments: L hemiplegia, poor postural control Restrictions Weight Bearing Restrictions: No    Therapy/Group: Individual Therapy  Kayleen Memos 08/02/2022, 7:47 AM

## 2022-08-02 NOTE — Evaluation (Signed)
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 year old  right-handed male with history of hypertension as well as former tobacco use.  Per chart review patient lives with his daughter.  1 level home 3 steps to entry.  Independent driving prior to admission.  Presented 07/19/2022 with left-sided weakness and dizziness of acute onset.  No nausea vomiting or fever reported.  Blood pressure 205/120.  CT/MRI showed small acute infarct right centrum semiovale.  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  MRA showed no large vessel occlusion.  Admission chemistry unremarkable except glucose 103, alcohol negative, urine drug screen negative.  Echocardiogram with ejection fraction of 55 to 60% no wall motion abnormalities grade 1 diastolic dysfunction.  Neurology follow-up placed on aspirin as well as Plavix for CVA prophylaxis.  Hospital course increasing left-sided weakness 07/23/2022 with MRI completed showing increased size of acute infarct involving the right corona radiata and basal ganglia.  MRI findings discussed with neurology no specific changes recommended and remains on low-dose aspirin with Plavix x3 weeks followed by aspirin alone.  Placed on Lovenox for DVT prophylaxis.  Monitoring of  permissive hypertension patient on losartan 50 mg prior to admission.  Tolerating a regular consistency diet.  Patient with nonspecific chest pain while eating breakfast troponin negative EKG mild sinus bradycardia.  Chest x-ray NAD.  Therapy evaluations completed due to patient's left-sided weakness and decreased functional mobility was admitted for a comprehensive rehab program  Patient transferred to CIR on 07/24/2022 .   Pt presents with decreased activity tolerance, decreased functional mobility, decreased balance, decreased midline orientation, L inattention decreased attention, decreased awareness, decreased problem solving, decreased safety awareness, and decreased memory Limiting pt's independence with leisure/community pursuits.  Plan  Min 1 TR session >20 minutes during LOS  Recommendations for other services: None   Discharge Criteria: Patient will be discharged from TR if patient refuses treatment 3 consecutive times without medical reason.  If treatment goals not met, if there is a change in medical status, if patient makes no progress towards goals or if patient is discharged from hospital.  The above assessment, treatment plan, treatment alternatives and goals were discussed and mutually agreed upon: by patient  [place] 08/02/2022, 3:57 PM

## 2022-08-02 NOTE — Progress Notes (Signed)
Occupational Therapy Session Note  Patient Details  Name: Daniel [name] MRN: [id number] Date of Birth: 1946-02-01  Today's Date: 08/02/2022 OT Individual Time: 0354-6568 OT Individual Time Calculation (min): 63 min  12 mins missed; MD providing botox injection    Short Term Goals: Week 2:  OT Short Term Goal 1 (Week 2): pt will complete UB dressing from EOB with CGA and CGA for dynamic sitting balance OT Short Term Goal 2 (Week 2): pt will complete 1 part of LB dressing with MIN A OT Short Term Goal 3 (Week 2): pt will incorporate LUE into ADLs with < 3 cues during functional mobility and transfers  Skilled Therapeutic Interventions/Progress Updates:  Pt greeted supine in bed. Pt completed supine>sit with MIN A to L side of bed. Pt completed squat pivot to w/c to R side with MINA. Pt transported into bathroom to complete shower. Pt completed squat pivot to shower seat to L side with MODA using grab bar. Pt completed bathing with overall MIN A, used grab on L side to support LUE and wash underneath L arm. Pt able to stand in shower with MIN A while pt washed buttock in standing. Pt completed squat pivot out of shower to R side with MIN A to w/c. Pt completed dressing from w/c with MOD A for UB dressing as pt had button up shirt today needing + assist for buttons. Pt able to don brief and pants with MOD A needing assist to thread LLE and pull pants up to waist line on L side in standing.  Pt completed grooming tasks seated at sink with MINA.  Total A to don teds and shoes. Total A transport to gym where remainder of session focus on LUE/LLE NMR.  Pt completed sit>stand at [facility] with LUE support on EOM at axillary with pt instructed to WB through LUE to reach forward to retrieve bean bags and place in bucket on R side of mat table. Pt completed 2 trials with overall MOD for upright posture,  body mechanics and overall body awareness.  Pt transported back to room in w/c with total A where pt completed  squat pivot back to EOB with MINA. Pt completed supine>sit with MOD A needing assist to elevate BLEs. Pt left supine in bed with all needs within reach and MD entering to complete botox injections.   Therapy Documentation Precautions:  Precautions Precautions: Fall, Other (comment) Precaution Comments: L hemiplegia, poor postural control Restrictions Weight Bearing Restrictions: No General: General OT Amount of Missed Time: 12 Minutes  Pain: No pain reported during session    Therapy/Group: Individual Therapy  Corinne Ports [facility] 08/02/2022, 12:02 PM

## 2022-08-03 NOTE — Progress Notes (Signed)
Physical Therapy Session Note  Patient Details  Name: Daniel [name] MRN: [id number] Date of Birth: February 23, 1946  Today's Date: 08/03/2022 PT Individual Time: 0915-1030 PT Individual Time Calculation (min): 75 min   Short Term Goals: Week 2:  PT Short Term Goal 1 (Week 2): Pt will perform supine<>sit with min assist consistently PT Short Term Goal 2 (Week 2): Pt will perform sit<> stand with min assist consistently PT Short Term Goal 3 (Week 2): Pt will perform squat pivot transfer with min assist consistently PT Short Term Goal 4 (Week 2): Pt will ambulate +34f with mod assist using LRAD (+2 WC as needed for safety). PT Short Term Goal 5 (Week 2): Pt will initiate stair training.  Skilled Therapeutic Interventions/Progress Updates: Pt presented in bed agreeable to therapy. Pt motivated to show PTA he is now able to move LLE against gravity. PTA donned TED hose and threaded pants total A. Pt was able to perform bridge and pull pants over hips with PTA supporting B and pt using RUE. Pt then performed supine to sit via sidelying with mod (very close to min)A. PTA donned shoes to time management and performed squat pivot transfer to L with modA. Pt transported to day room and set up in Lite Gait for BWS ambulation. Pt required max facilitation for weight shifting to R WITHOUT having pt pull on Lite Gait handle. Pt initally ambulated x127f with pt able to advance LLE but max multimodal cues for sequencing and engaging LLE. Pt noted to have significant L lean and would take significantly larger steps. After seated rest PTA obtained cover for L shoe and pt was able to ambulate an additional 2015fith improved technique. PTA behind pt providing max multimodal cues for weight shifting to R  when advancing LLE then pausing, engaging LLE THEN stepping with RLE. When pt provided with step by step instructions pt demonstrated significantly improved technique however continued to require max cues to shift weight  to R. Pt with notable fatigue therefore gait ended and pt transported to high/low mat. Performed squat pivot transfer to mat modA and participated in Sit to stand with PTA on L side blocking L knee and with mirror feedback. PTA facilitating weight shifting on L. PTA did note that pt demonstrated improved postural control in unsupported sitting than when last seen by this PTA. Pt performed squat pviot back to w/c and transported back to room. Pt requested to return to bed at end of session due to fatigue. Performed squat pivot transfer with MIN A to bed and required minA for sit to supine primarily for LLE management. Pt was able to reposition to comfort and left with bed alarm on, call bell within reach and needs met.      Therapy Documentation Precautions:  Precautions Precautions: Fall, Other (comment) Precaution Comments: L hemiplegia, poor postural control Restrictions Weight Bearing Restrictions: No General:   Vital Signs:  Pain:   Mobility:   Locomotion :    Trunk/Postural Assessment :    Balance:   Exercises:   Other Treatments:      Therapy/Group: Individual Therapy  Durrell Barajas 08/03/2022, 12:05 PM

## 2022-08-03 NOTE — Progress Notes (Signed)
Speech Language Pathology Daily Session Note  Patient Details  Name: Daniel [name] MRN: [id number] Date of Birth: Jun 14, 1946  Today's Date: 08/03/2022 SLP Individual Time: 1455-1535 SLP Individual Time Calculation (min): 40 min  Short Term Goals: Week 2: SLP Short Term Goal 1 (Week 2): Patient will consume current diet with minimal s/sx of aspiration and mod I for implementation of safe swallowing precautions and strategies SLP Short Term Goal 2 (Week 2): Patient will implement speech intelligiblity strategies at the sentence level with mod I in order to achieve >90% intelligibility SLP Short Term Goal 3 (Week 2): Patient will complete mildly complex problem solving tasks with min verbal/visual cues SLP Short Term Goal 4 (Week 2): Patient will utilize external memory aids to recall novel information with sup A verbal cues SLP Short Term Goal 5 (Week 2): Pt will sustain attention to functional tasks for 10 min duration given sup A verbal redirection cues  Skilled Therapeutic Interventions: Skilled ST treatment focused on cognitive and swallowing goals. Pt was sleeping in bed on arrival and roused to min verbal stimuli. SLP initiated treatment by inquiring on pt's tolerance of dysphagia 2 textures and thin liquids. It is important to note that pt continues to describe meals using language such as "I can't eat it" referring to taste of food rather than texture. Pt also referred to a drink in this way, by stating "I just can't drink it" (Dr Malachi Bonds) because beverage was not cold enough. Pt expressed he has particular food/drink preferences. Pt agreeable to dysphagia 3 PO trials for ongoing efforts for diet advancement. Pt consumed with timely and effective mastication with minimal oral residuals and without overt s/sx of aspiration. Pt did not have any complaints of difficulty masticating dys 3 textures and did not exhibit any anxious behaviors as observed during previous trials. Pt agreeable to  consideration of diet advancement to dysphagia 3 diet. Recommend dysphagia 3 trial tray during upcoming session(s) as schedule allows prior to advancement. Pt verbalized agreement.   SLP facilitated functional discussion re: anticipation of needs at discharge. Pt exhibited good awareness of deficits, effective anticipation of needs, and consideration of various beneficial adaptive equipment to support safety.   Pt was known to sustain attention for today's tasks for 15-20 minute intervals with sup A verbal redirection cues, and alternated attention well between different topics.   Pt required occasional supervision A verbal cues fading to mod I to implement speech intelligibility strategies at the conversation level to achieve >90% intelligibility. Slightly decreased intelligibility at onset of session may attribute to sleepiness from waking up from nap.   Patient was left in bed with alarm activated and immediate needs within reach at end of session. Continue per current plan of care.      Pain None/denied  Therapy/Group: Individual Therapy  Patty Sermons 08/03/2022, 3:05 PM

## 2022-08-03 NOTE — Progress Notes (Signed)
PROGRESS NOTE   Subjective/Complaints:  Doing "about the same"- can move leg more now- LUE still  "Slow" to move-  Phone has no cord.    ROS-  Pt denies SOB, abd pain, CP, N/V/C/D, and vision changes   Objective:   No results found. No results for input(s): "WBC", "HGB", "HCT", "PLT" in the last 72 hours. No results for input(s): "NA", "K", "CL", "CO2", "GLUCOSE", "BUN", "CREATININE", "CALCIUM" in the last 72 hours.  Intake/Output Summary (Last 24 hours) at 08/03/2022 0908 Last data filed at 08/03/2022 0741 Gross per 24 hour  Intake 339 ml  Output 525 ml  Net -186 ml        Physical Exam: Vital Signs Blood pressure (!) 135/101, pulse 98, temperature 98.3 F (36.8 C), temperature source Oral, resp. rate 18, height '5\' 10"'$  (1.778 m), weight 79.8 kg, SpO2 94 %.    General: awake, alert, appropriate, supine in bed; NAD HENT: conjugate gaze; oropharynx moist CV: regular rate; no JVD Pulmonary: CTA B/L; no W/R/R- good air movement GI: soft, NT, ND, (+)BS Psychiatric: appropriate Neurological: Ox3 Some movement in LLE and not much in LUE  Neurologic: Cranial nerves II through XII intact, motor strength is 5/5 in RIght deltoid, bicep, tricep, grip, hip flexor, knee extensors, ankle dorsiflexor and plantar flexor 2- triceps o/w 0/5 LUE, 3- HF and KE , 2- hamstrings on Left o/w 0/5  Sensory exam normal sensation to light touch and proprioception in bilateral upper and lower extremities Tone- MAS 3 in Left pec-mildly increased vs prior  Musculoskeletal: Full range of motion in all 4 extremities. No joint swelling, Left shoulder  pain - abd and ER , mild to moderate with ROM L wrist no effusion , erythema     Assessment/Plan: 1. Functional deficits which require 3+ hours per day of interdisciplinary therapy in a comprehensive inpatient rehab setting. Physiatrist is providing close team supervision and 24 hour management  of active medical problems listed below. Physiatrist and rehab team continue to assess barriers to discharge/monitor patient progress toward functional and medical goals  Care Tool:  Bathing    Body parts bathed by patient: Left arm, Chest, Abdomen, Front perineal area, Buttocks, Right upper leg, Left upper leg, Right lower leg, Left lower leg, Face   Body parts bathed by helper: Right arm     Bathing assist Assist Level: Minimal Assistance - Patient > 75%     Upper Body Dressing/Undressing Upper body dressing   What is the patient wearing?: Pull over shirt    Upper body assist Assist Level: Minimal Assistance - Patient > 75%    Lower Body Dressing/Undressing Lower body dressing      What is the patient wearing?: Pants, Underwear/pull up     Lower body assist Assist for lower body dressing: Moderate Assistance - Patient 50 - 74%     Toileting Toileting    Toileting assist Assist for toileting: Maximal Assistance - Patient 25 - 49% Assistive Device Comment: bedpan   Transfers Chair/bed transfer  Transfers assist  Chair/bed transfer activity did not occur: Safety/medical concerns  Chair/bed transfer assist level: Minimal Assistance - Patient > 75% (squat pivot to R side)  Locomotion Ambulation   Ambulation assist   Ambulation activity did not occur: Safety/medical concerns  Assist level: 2 helpers (mod A of 1 and +2 w/c follow) Assistive device: Other (comment) (R hallway rail) Max distance: 38f   Walk 10 feet activity   Assist  Walk 10 feet activity did not occur: Safety/medical concerns  Assist level: Maximal Assistance - Patient 25 - 49% Assistive device: Other (comment) (right wall hand railing)   Walk 50 feet activity   Assist Walk 50 feet with 2 turns activity did not occur: Safety/medical concerns         Walk 150 feet activity   Assist Walk 150 feet activity did not occur: Safety/medical concerns         Walk 10 feet on  uneven surface  activity   Assist Walk 10 feet on uneven surfaces activity did not occur: Safety/medical concerns         Wheelchair     Assist Is the patient using a wheelchair?: Yes Type of Wheelchair: Manual    Wheelchair assist level: Dependent - Patient 0%      Wheelchair 50 feet with 2 turns activity    Assist        Assist Level: Dependent - Patient 0%   Wheelchair 150 feet activity     Assist      Assist Level: Dependent - Patient 0%   Blood pressure (!) 135/101, pulse 98, temperature 98.3 F (36.8 C), temperature source Oral, resp. rate 18, height '5\' 10"'$  (1.778 m), weight 79.8 kg, SpO2 94 %.  Medical Problem List and Plan: 1. 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  Antithrombotics: -DVT/anticoagulation:  Pharmaceutical: Lovenox             -antiplatelet therapy: Aspirin 81 mg daily and Plavix 75 mg day x3 weeks (end 9/22) then aspirin alone 3. Pain Management: Tylenol as needed 4. Mood/Behavior/Sleep: Provide emotional support             -antipsychotic agents: N/A 5. Neuropsych/cognition: This patient is capable of making decisions on his own behalf. 6. Skin/Wound Care: Routine skin checks 7. Fluids/Electrolytes/Nutrition: Routine in and outs with follow-up chemistries Discussed with patient importance of fluid intake to prevent dehydration, Will ask nursing to encourage fluids 8.  Permissive hypertension.  Decrease Losartan to '25mg'$ , continue this dose  9/15- BP controlled- cont' regimen Vitals:   08/02/22 1914 08/03/22 0506  BP: 115/68 (!) 135/101  Pulse: 64 98  Resp: 18 18  Temp: 98.7 F (37.1 C) 98.3 F (36.8 C)  SpO2: 94% 94%    9.   Hyperlipidemia.  continue Crestor  10.  COnstipation add senna  -Order sorbitol  9/15- LBM 9/13- if no BM by tomorrow- will need sorbitol again.  11.  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 This includes bleeding, bruising, infection, excessive weakness, or medication side effects. Discussed with pt and daughter  Needle: 25g 1.5" needle  Number of units per muscle Pectoralis50U x 3 Waste 50U  All injections were done after obtaining appropriate EMG activity and after negative drawback for blood. The patient tolerated the procedure well. LOS: 10 days A FACE TO FACE EVALUATION WAS PERFORMED  Zoila Ditullio 08/03/2022, 9:08 AM

## 2022-08-03 NOTE — Progress Notes (Signed)
Occupational Therapy Session Note  Patient Details  Name: Daniel [name] MRN: [id number] Date of Birth: 1946/09/10  Today's Date: 08/03/2022 OT Individual Time: 1131-1200 session 1 OT Individual Time Calculation (min): 29 min  Session 2: [id number]   Short Term Goals: Week 2:  OT Short Term Goal 1 (Week 2): pt will complete UB dressing from EOB with CGA and CGA for dynamic sitting balance OT Short Term Goal 2 (Week 2): pt will complete 1 part of LB dressing with MIN A OT Short Term Goal 3 (Week 2): pt will incorporate LUE into ADLs with < 3 cues during functional mobility and transfers  Skilled Therapeutic Interventions/Progress Updates:  Pt greeted supine in bed, pt  agreeable to OT intervention.  Worked on LUE ROM using estim: 1:1 NMES applied to L bicep at the below settings:    Ratio 1:3 Rate 35 pps Waveform- Asymmetric Ramp 1.0 Pulse 300 Intensity- 21 Duration -   15 mins No pain or adverse reactions noted after session.  Great return noted at L bicep able to complete full ROM elbow flexion/extension.  Pt left supine in bed with bed alarm activated and all needs within reach.               Session 2:  Pt greeted supine in bed, pt agreeable to OT intervention. Pt completed supine>sit with MOD A needing assist to elevate trunk into sitting and scoot hips to EOB. Pt completed squat pivot to w/c to R side with MIN A. Total A transport to gym in w/c where pt completed squat pivot transfer to EOM to R side with MIN A.  Session focus on supine/sidelying AROM/AAROM/PROM to facilitate improved ROM in LUE and decreased pain. Pt able to complete horizontal ABD/ADD in supine with MIN- MOD A with L elbow supported. Pt with motor impersistencies noted to activate all muscles when trying to isolate one joint, worked on trying to actively relax L pec as  pt tends to over activate L pec when working on isolate movements in L elbow.  Pt also able to transition to sidelying with LUE placed on  therapy with pt able to work on active shoulder flexion/extension and scapular protraction/retraction.        Pt completed supine>sit from sidelying on mat with MIINA. Pt completed x2 squat pivot transfers to end session from EOM>w/c and w/c>EOB with MINA as pt with improved ability to shift weight anteriorly to scoot hips.  Pt left supine in bed with LUE positioned in abduction.  Bed alarm activated and all needs within reach.          Therapy Documentation Precautions:  Precautions Precautions: Fall, Other (comment) Precaution Comments: L hemiplegia, poor postural control Restrictions Weight Bearing Restrictions: No   Pain: unrated pain reported in LUE, rest breaks, repositioning and light stretching provided as needed.     Therapy/Group: Individual Therapy  Corinne Ports [facility] 08/03/2022, 12:30 PM

## 2022-08-04 NOTE — Progress Notes (Signed)
Physical Therapy Session Note  Patient Details  Name: Daniel [name] MRN: [id number] Date of Birth: 11-27-1945  Today's Date: 08/04/2022 PT Individual Time: 1134-1206 PT Individual Time Calculation (min): 32 min   Short Term Goals: Week 2:  PT Short Term Goal 1 (Week 2): Pt will perform supine<>sit with min assist consistently PT Short Term Goal 2 (Week 2): Pt will perform sit<> stand with min assist consistently PT Short Term Goal 3 (Week 2): Pt will perform squat pivot transfer with min assist consistently PT Short Term Goal 4 (Week 2): Pt will ambulate +41f with mod assist using LRAD (+2 WC as needed for safety). PT Short Term Goal 5 (Week 2): Pt will initiate stair training.  Skilled Therapeutic Interventions/Progress Updates:    Pt greeted supine in bed and agreeable to therapy. Having some shoulder soreness from being in bed too long.   Supine> sit with mod A from SPT for trunk and L LE management with pt also utilizing bed railing. Static and dynamic sitting requiring mod assist at first for trunk management due to posterior lean, but improved to CGA when cued to lean forward continually. Donned TED hose and shoes and threaded pants with total assist from SPT for time management. Sit<> stand with min/mod assist +2 for balance with cues for L foot placement and max assist with pulling up pants once standing.  Squat pivot to the [facility] for [facility] management and cues for fwd trunk lean and left foot placement. Pt improving in utilizing B LE's and R UE to assist in transfers.  [facility]   Amb x 43fwith mod assist + 2. One therapist providing R HH assist and SPT providing assist at the trunk and L knee to stabilize against buckling and hyperextension. Pt requires cues to weight shift to the R to ease L LE swing through, cues to perform L TKE at beginning of L stance phase. Improvements in weight shifting noted as  compared to at time of eval. SPT assisting pt with placement of L foot at beginning of stance phase due to pt adducting hip too much. Amb distance limited by fatigue.   Transported back to room for time.  Squat pivot [facility] to the left with light mod assist for trunk management and cues for fwd lean and left foot placement. Pt left sitting in recliner, L arm elevated via pillow, chair alarm on, call bell in reach, and all needs met.   Therapy Documentation Precautions:  Precautions Precautions: Fall, Other (comment) Precaution Comments: L hemiplegia, poor postural control Restrictions Weight Bearing Restrictions: No    Therapy/Group: Individual Therapy  AnKayleen MemosSPT 08/04/2022, 12:18 PM

## 2022-08-05 DIAGNOSIS — I639 Cerebral infarction, unspecified: Secondary | ICD-10-CM

## 2022-08-05 MED ORDER — TRAZODONE HCL 50 MG PO TABS
75.0000 mg | ORAL_TABLET | Freq: Every day | ORAL | Status: DC
  Administered 2022-08-05 – 2022-08-14 (×10): 75 mg via ORAL
  Filled 2022-08-05 (×10): qty 2

## 2022-08-05 NOTE — Progress Notes (Addendum)
PROGRESS NOTE   Subjective/Complaints:  Patient seen at bedside. No events overnight. C/o waking up in the middle of the night, after 2-3 hours of sleep.   LBM 9/16x2     08/05/2022    3:48 AM 08/04/2022    6:16 PM 08/04/2022    1:16 PM  Vitals with BMI  Systolic [phone]  Diastolic 75 69 76  Pulse 54 60 95     ROS: Denies fevers, chills, N/V, abdominal pain, constipation, diarrhea, SOB, cough, chest pain, new weakness or paraesthesias.    Objective:   No results found. No results for input(s): "WBC", "HGB", "HCT", "PLT" in the last 72 hours. No results for input(s): "NA", "K", "CL", "CO2", "GLUCOSE", "BUN", "CREATININE", "CALCIUM" in the last 72 hours.  Intake/Output Summary (Last 24 hours) at 08/05/2022 0842 Last data filed at 08/05/2022 0630 Gross per 24 hour  Intake 354 ml  Output 1175 ml  Net -821 ml        Physical Exam: Vital Signs reviewed as above.  Constitutional: No apparent distress. Appropriate appearance for age.  HENT: Atraumatic, normocephalic. Eyes: PERRL. No apparent EOM deficits.  Cardiovascular: RRR, no murmurs/rub/gallops. No Edema.   Respiratory: CTAB. No rales, rhonchi, or wheezing. On RA.  Abdomen: + bowel sounds, normoactive, nondistended  Skin: C/D/I. No apparent lesions.  MSK: +LUE weakness Neurologic exam:  Cognition: AAO to person, place, time and event.   PE from prior encounter: General: awake, alert, appropriate, supine in bed; NAD HENT: conjugate gaze; oropharynx moist CV: regular rate; no JVD Pulmonary: CTA B/L; no W/R/R- good air movement GI: soft, NT, ND, (+)BS Psychiatric: appropriate Neurological: Ox3 Some movement in LLE and not much in LUE   Neurologic: Cranial nerves II through XII intact, motor strength is 5/5 in RIght deltoid, bicep, tricep, grip, hip flexor, knee extensors, ankle dorsiflexor and plantar flexor 2- triceps o/w 0/5 LUE, 3- HF and KE , 2-  hamstrings on Left o/w 0/5  Sensory exam normal sensation to light touch and proprioception in bilateral upper and lower extremities Tone- MAS 3 in Left pec-mildly increased vs prior  Musculoskeletal: Full range of motion in all 4 extremities. No joint swelling, Left shoulder  pain - abd and ER , mild to moderate with ROM L wrist no effusion , erythema        Assessment/Plan: 1. Functional deficits which require 3+ hours per day of interdisciplinary therapy in a comprehensive inpatient rehab setting. Physiatrist is providing close team supervision and 24 hour management of active medical problems listed below. Physiatrist and rehab team continue to assess barriers to discharge/monitor patient progress toward functional and medical goals  Care Tool:  Bathing    Body parts bathed by patient: Left arm, Chest, Abdomen, Front perineal area, Buttocks, Right upper leg, Left upper leg, Right lower leg, Left lower leg, Face   Body parts bathed by helper: Right arm     Bathing assist Assist Level: Minimal Assistance - Patient > 75%     Upper Body Dressing/Undressing Upper body dressing   What is the patient wearing?: Pull over shirt    Upper body assist Assist Level: Minimal Assistance - Patient > 75%  Lower Body Dressing/Undressing Lower body dressing      What is the patient wearing?: Pants, Underwear/pull up     Lower body assist Assist for lower body dressing: Moderate Assistance - Patient 50 - 74%     Toileting Toileting    Toileting assist Assist for toileting: Maximal Assistance - Patient 25 - 49% Assistive Device Comment: bedpan   Transfers Chair/bed transfer  Transfers assist  Chair/bed transfer activity did not occur: Safety/medical concerns  Chair/bed transfer assist level: Minimal Assistance - Patient > 75% (squat pivot to R side)     Locomotion Ambulation   Ambulation assist   Ambulation activity did not occur: Safety/medical concerns  Assist  level: 2 helpers (mod A of 1 and +2 w/c follow) Assistive device: Other (comment) (R hallway rail) Max distance: 60f   Walk 10 feet activity   Assist  Walk 10 feet activity did not occur: Safety/medical concerns  Assist level: Maximal Assistance - Patient 25 - 49% Assistive device: Other (comment) (right wall hand railing)   Walk 50 feet activity   Assist Walk 50 feet with 2 turns activity did not occur: Safety/medical concerns         Walk 150 feet activity   Assist Walk 150 feet activity did not occur: Safety/medical concerns         Walk 10 feet on uneven surface  activity   Assist Walk 10 feet on uneven surfaces activity did not occur: Safety/medical concerns         Wheelchair     Assist Is the patient using a wheelchair?: Yes Type of Wheelchair: Manual    Wheelchair assist level: Dependent - Patient 0%      Wheelchair 50 feet with 2 turns activity    Assist        Assist Level: Dependent - Patient 0%   Wheelchair 150 feet activity     Assist      Assist Level: Dependent - Patient 0%   Blood pressure 114/75, pulse (!) 54, temperature 98.4 F (36.9 C), temperature source Oral, resp. rate 18, height '5\' 10"'$  (1.778 m), weight 79.8 kg, SpO2 95 %.  Assessment and plan: 1. 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  Antithrombotics: -DVT/anticoagulation:  Pharmaceutical: Lovenox             -antiplatelet therapy: Aspirin 81 mg daily and Plavix 75 mg day x3 weeks (end 9/22) then aspirin alone 3. Pain Management: Tylenol as needed 4. Mood/Behavior/Sleep: Provide emotional support             -antipsychotic agents: N/A              - 9/17 - inc trazodone form 50 to 75 mg qhs  5. Neuropsych/cognition: This patient is capable of making decisions on his own behalf. 6. Skin/Wound Care: Routine skin checks 7. Fluids/Electrolytes/Nutrition: Routine in and outs with follow-up chemistries Discussed with patient importance of fluid intake to prevent dehydration, Will ask nursing to  encourage fluids 8.  Permissive hypertension.  Decrease Losartan to '25mg'$ , continue this dose             9/15- BP controlled- cont' regimen             9/17 - BP well controlled    08/05/2022    3:48 AM 08/04/2022    6:16 PM 08/04/2022    1:16 PM  Vitals with BMI  Systolic [phone]  Diastolic 75 69 76  Pulse 54 60 95      9.  Hyperlipidemia.  continue Crestor  10.  COnstipation add senna             -Order sorbitol             9/15- LBM 9/13- if no BM by tomorrow- will need sorbitol again.              9/16 - x2 BM   11.  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

## 2022-08-05 NOTE — Progress Notes (Signed)
Occupational Therapy Session Note  Patient Details  Name: KEIN CARLBERG MRN: [id number] Date of Birth: 1946-09-01  Today's Date: 08/05/2022 OT Individual Time: 1345-1430 OT Individual Time Calculation (min): 45 min    Short Term Goals: Week 1:  OT Short Term Goal 1 (Week 1): Patient will sit at edge of bed with only intermittent min assist in preparation for a level surface transfer to wheelchair or commode OT Short Term Goal 1 - Progress (Week 1): Met OT Short Term Goal 2 (Week 1): Patient will don pull over shirt with min assist and verbal cueing after set up OT Short Term Goal 2 - Progress (Week 1): Met OT Short Term Goal 3 (Week 1): Patient will don pants over left foot with mod assistance OT Short Term Goal 3 - Progress (Week 1): Met OT Short Term Goal 4 (Week 1): Patient will maintain static sittin gbalance while on commode with only intermittent min assistance OT Short Term Goal 4 - Progress (Week 1): Met OT Short Term Goal 5 (Week 1): Patient will bathe upper body with min assist Week 2:  OT Short Term Goal 1 (Week 2): pt will complete UB dressing from EOB with CGA and CGA for dynamic sitting balance OT Short Term Goal 2 (Week 2): pt will complete 1 part of LB dressing with MIN A OT Short Term Goal 3 (Week 2): pt will incorporate LUE into ADLs with < 3 cues during functional mobility and transfers Week 3:     Skilled Therapeutic Interventions/Progress Updates:    1:1 NMR: Pt was in bed when arrived. Addressed bed mobility fostering normal patterns of movement not just big momentum to move; also how to control his movement. Donned paper pants sitting EOB with min to mod A for dynamic balance and attention to balance during the task with multimodal cues. Pt transferred to the left into w/c with min  A with facilitation to maintain forward weight shift.   In the gym continued to pratice transfers to the left with control and maintaining forward weight shift. Sitting on the mat with  mirror for visual feedback continued to work on postural control with controlled weight shifts without use of UE. Then transitioned into sit to stand with control and ability to self correct lean to the left without adjusting feet. Pt then reported toileting needs- transfer to and from toilet with still facilitation of weight shift and postural control. Toileting in standing with environmental cues to keep weight shift to the right while OT assisted with toileting steps.   Returned to the gym and transitioned back to the mat again to the left. Focus on use of left Ue to assist back into sitting from a sidelying position on the mat with A for UE's positioning. Pt able to push through left UE with support to return to sitting position.   Left in the room with family - in the recliner.   Therapy Documentation Precautions:  Precautions Precautions: Fall, Other (comment) Precaution Comments: L hemiplegia, poor postural control Restrictions Weight Bearing Restrictions: No  Pain:  Reported sleeping funny on his left Ue and was painful but not now    Therapy/Group: Individual Therapy  Willeen [facility] 08/05/2022, 2:47 PM

## 2022-08-06 NOTE — Progress Notes (Signed)
Occupational Therapy Session Note  Patient Details  Name: Daniel [name] MRN: [id number] Date of Birth: 1946/09/10  Today's Date: 08/06/2022 OT Individual Time: 0955-1100 OT Individual Time Calculation (min): 65 min  and Today's Date: 08/06/2022 OT Missed Time: 10 Minutes Missed Time Reason: Other (comment) (therapist running late from previous session)   Short Term Goals: Week 2:  OT Short Term Goal 1 (Week 2): pt will complete UB dressing from EOB with CGA and CGA for dynamic sitting balance OT Short Term Goal 2 (Week 2): pt will complete 1 part of LB dressing with MIN A OT Short Term Goal 3 (Week 2): pt will incorporate LUE into ADLs with < 3 cues during functional mobility and transfers  Skilled Therapeutic Interventions/Progress Updates:  Pt greeted seated in w/c,pt  agreeable to OT intervention. Session focus on LUE NMR, AAROM, functional mobility, standing balance and increasing overall activity tolerance. Pt reports ADL needs had been met, total A transport in w/c to gym where pt completed squat pivot transfer from w/c> EOM to R side with CGA.  Pt transitioned sitting> sidelying with MOD A needing assist to elevate BLEs to EOM. Worked on LUE AAROM in sidelying with LUE positioned on side table with pillow case placed underneath LUE to decrease friction. Pt able to complete sidelying shoulder flexion/extension 2-/5. Pt also able to work on scapular protraction/retraction from sidelying 2-/5 with an emphasis on relaxing accessory muscles as pt turning on all muscles to try to activate LUE.  Pt transitioned back to sitting EOB with MODA. Worked on sit>stands with overall MINA with visual aid provided to work on midline orientation and dynamic standing balance with pt instructed to reach out of BOS with RUE to retrieve clothespin from mirror with an emphasis on lateral weight shifts to activate L quad and sustain activation.  Pt completed squat pivot back to w/c to Lside with MIN A needing MAX  cues to shift weight anteriorly during transfer.  Pt transported back to room in w/ c with total A where pt completed squat pivot back to bed with CGA to R side.  Pt left supine in bed with bed alarm activated and all needs within reach.           Therapy Documentation Precautions:  Precautions Precautions: Fall, Other (comment) Precaution Comments: L hemiplegia, poor postural control Restrictions Weight Bearing Restrictions: No General: General OT Amount of Missed Time: 10 Minutes    Pain: no pain reported during session     Therapy/Group: Individual Therapy  Corinne [facility] 08/06/2022, 12:03 PM

## 2022-08-06 NOTE — Progress Notes (Signed)
Physical Therapy Session Note  Patient Details  Name: Daniel [name] MRN: [id number] Date of Birth: 03-Aug-1946  Today's Date: 08/06/2022 PT Individual Time: 0807-0905 PT Individual Time Calculation (min): 58 min   Short Term Goals: Week 1:  PT Short Term Goal 1 (Week 1): Pt will perform supine<>sit with mod assist consistently PT Short Term Goal 1 - Progress (Week 1): Met PT Short Term Goal 2 (Week 1): Pt will perform sit<>stands using LRAD with mod assist PT Short Term Goal 2 - Progress (Week 1): Met PT Short Term Goal 3 (Week 1): Pt will perform bed<>chair transfers using LRAD with mod assist PT Short Term Goal 3 - Progress (Week 1): Met PT Short Term Goal 4 (Week 1): Pt will ambulate at least 61f with +2 mod assist PT Short Term Goal 4 - Progress (Week 1): Met Week 2:  PT Short Term Goal 1 (Week 2): Pt will perform supine<>sit with min assist consistently PT Short Term Goal 2 (Week 2): Pt will perform sit<> stand with min assist consistently PT Short Term Goal 3 (Week 2): Pt will perform squat pivot transfer with min assist consistently PT Short Term Goal 4 (Week 2): Pt will ambulate +687fwith mod assist using LRAD (+2 WC as needed for safety). PT Short Term Goal 5 (Week 2): Pt will initiate stair training.  Skilled Therapeutic Interventions/Progress Updates:  Patient supine in bed on entrance to room. Patient alert and agreeable to PT session. Requires dressing prior to leaving room for therapy.   Patient with no pain complaint at start of session.  Therapeutic Activity: Bed Mobility: Donned TED hose with MaxA prior to initiating bed mobility. Pt performed supine --> sit with pt able to bring BLE toward EOB with vc. Unable to push from bed surface and requires HHA with modA to bring UB to seated position on EOB. VC/ tc required for technique. Pants threaded onto feet with pt able to assist by lifting feet from floor. Transfers: Pt performed sit<>stand and stand pivot transfers  throughout session with MinA for initiation. CGA to maintain standing static balance.  Provided verbal cues for technique throughout session including forward lean, forward scoot, BLE placement and R vs L hand placement. .  Gait Training:  Short distance ambulation performed in room in order to assess pt's L knee mobility and strength. Pt demos more potential for buckle than uncontrolled hyperextension. Attempt to locate Thusane sprystep Plus GRAFO for pt to trial, however none available. Request sent to orthotist for mentioned AFO in pt's size and length. Trialed medium sized ottobock walk-on with lateral support and then [employer] AFO with medial support and increased rigidity and shin support. Although height only reaches mid lower leg, Thusane AFO provided improved stability with pt able to control knee extension to prevent movement in GR. Pt ambulated 38 ft with each AFO donned using hallway HR to RUE in order to promote improved UB stability to assess LE needs in AFO. VC/ tc throughout for increased L hip/ knee flexion for improved foot clearance, increased abd for lateral foot placement, and attention to knee position during WB to prevent uncontrolled hyperextension.  Neuromuscular Re-ed: NMR facilitated during session with focus on standing balance. Pt guided in  sit<>stand to no AD in order to improve body mechanics and weightshifting. No AD used. Performed with HHA. Stand pivot performed with HHA and focus on foot placement instead of twisting in order to reduce torque on knee joints. Improved from [facility]  overall with no AD.  NMR performed for improvements in motor control and coordination, balance, sequencing, judgement, and self confidence/ efficacy in performing all aspects of mobility at highest level of independence.   Patient seated upright in w/c at end of session with brakes locked, belt alarm set, and all needs within reach. Oriented to current time and time of next PT  session.    Therapy Documentation Precautions:  Precautions Precautions: Fall, Other (comment) Precaution Comments: L hemiplegia, poor postural control Restrictions Weight Bearing Restrictions: No General:   Vital Signs:   Pain: Pain Assessment Pain Scale: 0-10 Pain Score: 0-No pain  Therapy/Group: Individual Therapy  Alger Simons PT, DPT, CSRS 08/06/2022, 10:26 AM

## 2022-08-06 NOTE — Progress Notes (Signed)
PROGRESS NOTE   Subjective/Complaints:  No issues overnite , in WC starting PT session , no shoulder and wrist pain c/os  LBM 9/16x2     08/06/2022    5:14 AM 08/05/2022    7:03 PM 08/05/2022    3:00 PM  Vitals with BMI  Systolic [phone]  Diastolic 70 74 63  Pulse 67 57 97     ROS: Denies fevers, chills, N/V, abdominal pain, constipation, diarrhea, SOB, cough, chest pain, new weakness or paraesthesias.    Objective:   No results found. No results for input(s): "WBC", "HGB", "HCT", "PLT" in the last 72 hours. No results for input(s): "NA", "K", "CL", "CO2", "GLUCOSE", "BUN", "CREATININE", "CALCIUM" in the last 72 hours.  Intake/Output Summary (Last 24 hours) at 08/06/2022 0903 Last data filed at 08/06/2022 0756 Gross per 24 hour  Intake 820 ml  Output 300 ml  Net 520 ml         Physical Exam: Vital Signs reviewed as above.   General: No acute distress Mood and affect are appropriate Heart: Regular rate and rhythm no rubs murmurs or extra sounds Lungs: Clear to auscultation, breathing unlabored, no rales or wheezes Abdomen: Positive bowel sounds, soft nontender to palpation, nondistended Extremities: No clubbing, cyanosis, or edema     Neurologic: Cranial nerves II through XII intact, motor strength is 5/5 in RIght deltoid, bicep, tricep, grip, hip flexor, knee extensors, ankle dorsiflexor and plantar flexor 2- triceps o/w 0/5 LUE, 3- HF and KE , 2- hamstrings on Left o/w 0/5  Sensory exam normal sensation to light touch and proprioception in bilateral upper and lower extremities Tone- MAS 2 in Left pec-mildly decreased vs prior  Musculoskeletal: Full range of motion in all 4 extremities. No joint swelling, Left shoulder  pain - improved with abd, ext rotation        Assessment/Plan: 1. Functional deficits which require 3+ hours per day of interdisciplinary therapy in a comprehensive inpatient rehab  setting. Physiatrist is providing close team supervision and 24 hour management of active medical problems listed below. Physiatrist and rehab team continue to assess barriers to discharge/monitor patient progress toward functional and medical goals  Care Tool:  Bathing    Body parts bathed by patient: Left arm, Chest, Abdomen, Front perineal area, Buttocks, Right upper leg, Left upper leg, Right lower leg, Left lower leg, Face   Body parts bathed by helper: Right arm     Bathing assist Assist Level: Minimal Assistance - Patient > 75%     Upper Body Dressing/Undressing Upper body dressing   What is the patient wearing?: Pull over shirt    Upper body assist Assist Level: Minimal Assistance - Patient > 75%    Lower Body Dressing/Undressing Lower body dressing      What is the patient wearing?: Pants, Underwear/pull up     Lower body assist Assist for lower body dressing: Moderate Assistance - Patient 50 - 74%     Toileting Toileting    Toileting assist Assist for toileting: Maximal Assistance - Patient 25 - 49% Assistive Device Comment: bedpan   Transfers Chair/bed transfer  Transfers assist  Chair/bed transfer activity did not occur:  Safety/medical concerns  Chair/bed transfer assist level: Minimal Assistance - Patient > 75% (squat pivot to R side)     Locomotion Ambulation   Ambulation assist   Ambulation activity did not occur: Safety/medical concerns  Assist level: 2 helpers (mod A of 1 and +2 w/c follow) Assistive device: Other (comment) (R hallway rail) Max distance: 56f   Walk 10 feet activity   Assist  Walk 10 feet activity did not occur: Safety/medical concerns  Assist level: Maximal Assistance - Patient 25 - 49% Assistive device: Other (comment) (right wall hand railing)   Walk 50 feet activity   Assist Walk 50 feet with 2 turns activity did not occur: Safety/medical concerns         Walk 150 feet activity   Assist Walk 150 feet  activity did not occur: Safety/medical concerns         Walk 10 feet on uneven surface  activity   Assist Walk 10 feet on uneven surfaces activity did not occur: Safety/medical concerns         Wheelchair     Assist Is the patient using a wheelchair?: Yes Type of Wheelchair: Manual    Wheelchair assist level: Dependent - Patient 0%      Wheelchair 50 feet with 2 turns activity    Assist        Assist Level: Dependent - Patient 0%   Wheelchair 150 feet activity     Assist      Assist Level: Dependent - Patient 0%   Blood pressure 116/70, pulse 67, temperature 97.6 F (36.4 C), temperature source Oral, resp. rate 18, height '5\' 10"'$  (1.778 m), weight 79.8 kg, SpO2 96 %.  Assessment and plan: 1. 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  Antithrombotics: -DVT/anticoagulation:  Pharmaceutical: Lovenox             -antiplatelet therapy: Aspirin 81 mg daily and Plavix 75 mg day x3 weeks (end 9/22) then aspirin alone 3. Pain Management: Tylenol as needed 4. Mood/Behavior/Sleep: Provide emotional support             -antipsychotic agents: N/A             - 9/17 - inc trazodone form 50 to 75 mg qhs  5. Neuropsych/cognition: This patient is capable of making decisions on his own behalf. 6. Skin/Wound Care: Routine skin checks 7. Fluids/Electrolytes/Nutrition: Routine in and outs with follow-up chemistries Discussed with patient importance of fluid intake to prevent dehydration, Will ask nursing to encourage fluids 8.  Permissive hypertension.  Decrease Losartan to '25mg'$ , continue this dose             9/15- BP controlled- cont' regimen              9/17 - BP well controlled    08/06/2022    5:14 AM 08/05/2022    7:03 PM 08/05/2022    3:00 PM  Vitals with BMI  Systolic [id number]  Diastolic 70 74 63  Pulse 67 57 97      9.  Hyperlipidemia.  continue Crestor  10.  COnstipation add sennaS 2 po BID              -Order sorbitol             9/15- LBM 9/13- if no BM by tomorrow- will need sorbitol again.              9/16 - x2 BM   11.  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

## 2022-08-06 NOTE — Progress Notes (Signed)
Speech Language Pathology Daily Session Note  Patient Details  Name: Daniel [name] MRN: [id number] Date of Birth: Sep 16, 1946  Today's Date: 08/06/2022 SLP Individual Time: 4827-0786 SLP Individual Time Calculation (min): 41 min  Short Term Goals: Week 2: SLP Short Term Goal 1 (Week 2): Patient will consume current diet with minimal s/sx of aspiration and mod I for implementation of safe swallowing precautions and strategies SLP Short Term Goal 2 (Week 2): Patient will implement speech intelligiblity strategies at the sentence level with mod I in order to achieve >90% intelligibility SLP Short Term Goal 3 (Week 2): Patient will complete mildly complex problem solving tasks with min verbal/visual cues SLP Short Term Goal 4 (Week 2): Patient will utilize external memory aids to recall novel information with sup A verbal cues SLP Short Term Goal 5 (Week 2): Pt will sustain attention to functional tasks for 10 min duration given sup A verbal redirection cues  Skilled Therapeutic Interventions: Skilled ST treatment focused on cognitive goals. Pt received semi reclined in bed on arrival. SLP facilitated session by providing education on current medications. Prior to education, pt was unfamiliar with most current medications, with the exception of "something for blood pressure." Following education, pt was generally aware of medications by purpose with min A verbal cues. SLP facilitated medication label comprehension and problem solving task with overall sup-to-min A verbal cues for verbal reasoning and medication safety. Pt utilized therapy schedule as external memory aid to recall therapists names with sup A verbal cues. Pt exhibited some difficulty reading due to small font size. Pt did not recall any redirections for attention for duration of session. Patient was left in bed with alarm activated and immediate needs within reach at end of session. Continue per current plan of care.      Pain Pain  Assessment Pain Scale: 0-10 Pain Score: 0-No pain  Therapy/Group: Individual Therapy  Patty Sermons 08/06/2022, 2:24 PM

## 2022-08-07 MED ORDER — SENNOSIDES-DOCUSATE SODIUM 8.6-50 MG PO TABS
1.0000 | ORAL_TABLET | Freq: Two times a day (BID) | ORAL | Status: DC
  Administered 2022-08-07 – 2022-08-15 (×15): 1 via ORAL
  Filled 2022-08-07 (×16): qty 1

## 2022-08-07 NOTE — Progress Notes (Signed)
Physical Therapy Session Note  Patient Details  Name: Daniel [name] MRN: [id number] Date of Birth: 20-Apr-1946  Today's Date: 08/07/2022 PT Individual Time: 1304-1402 and 1602 to 1713 PT Individual Time Calculation (min): 58 min and 71 min  Short Term Goals: Week 2:  PT Short Term Goal 1 (Week 2): Pt will perform supine<>sit with min assist consistently PT Short Term Goal 2 (Week 2): Pt will perform sit<> stand with min assist consistently PT Short Term Goal 3 (Week 2): Pt will perform squat pivot transfer with min assist consistently PT Short Term Goal 4 (Week 2): Pt will ambulate +49f with mod assist using LRAD (+2 WC as needed for safety). PT Short Term Goal 5 (Week 2): Pt will initiate stair training.  Skilled Therapeutic Interventions/Progress Updates:     Session 1:  Pt greeted supine in bed and agreeable to therapy. No c/o pain.   Therapeutic activity:  Practiced rolling in bed to right and left x 2 with bed rail and no physical assist. Minimal cues needed.   x 2 without bed rails with no physical assist needed and moderate cues for hand placement and momentum.  SL on left >sit with heavy mod assist for trunk and LE management. Pt had decreased ability to push up with R arm while lying on the left arm.  Once sitting pt had one LOB falling to the left and required mod assist to recover. CGA/SBA needed with static and dynamic sitting while SPT donned shoes and left Thuasne Sprystep Plus GRAFO.  Stand pivot transfer to the right from EOB to [facility]with mod assist for balance and heavy cues for foot placement and weight shifting.  Sit <>stand from mat table to RW with min assist, hand splint and mirror feedback performed x 3 with cues to straighten L knee, weight shift appropriately to position feet, and cues to stand erect.   Gait training:  Amb 75 ft with 2 turns and 2 helpers. Pt putting right arm over PT shoulders and SPT guarding at trunk and L knee to prevent buckling or hyper  extension. Applied Thuasne Sprystep Plus GRAFO to L LE and pt tolerated well with decreased L knee TKE and buckling. Pt continues required heavy cuing to properly weight shift in order to clear swing through bilaterally. L foot toe out due to hip ER from decreased swing through and placement ability. Cues also given to slow cadence due to pt rushing and not performing steps properly.  Cues needed during turning for foot placement and weight shifting.   [facility] and [facility], call bell in reach, and all needs met.   Session 2:  [facility].   Transported pt down to main therapy gym [facility]  Pt wearing Thuasne Sprystep Plus GRAFO on L LE during session.   Transfers:  X 10 sit<> stand transfers with min assist 5 trials from WC to RW, 5 trials from mat table to RW. Initially heavy cues for fwd trunk flexion to to initiate standing and when preparing to sit. Pt responded well to the verbal cuing to bring nose to knees and required min/mod cues for last few transfers with improved overall form and less assistance needed. Pt also required occasional cues to decrease speed of stand in order to improve balance upon standing.   X1 stand pivot [facility]  at end of session requiring mod assist for balance and cuing for weight shifting and bilat foot placement.   Gait training:  41f x 6 with mod A + 2. PT guiding RW and weight shifting, SPT manually facilitating correct L foot placement due to pt having trouble with L swing through and dragging toe when attempting. Toe out with hip ER due to toe drag. Pt continues to require verbal and tactile cues to shift weight over R LE to improve swing through of L LE. Pt improved with reps and required decreased cues for correction. Pt unable to progress and control RW alone and requires mod assist for steering and propulsion.   Turning performed with every gait trial and pt requires cuing for weight shifting and foot placement due to pt not fully weight shifting onto R LE and, as a result, having difficulty initiating movement with L LE. Pt follows all cues well.   Transported pt back [facility].   Upon arrival to room daughter and granddaughter are present. Discussed home set up with daughter. Confirmed ramp installation, discussed obtaining hallway and doorway measurements for WC clearance, and HH PT. Daughter verbalized understanding.   Pt left seated in recliner with call bell in reach, seatbelt alarm on, family present, and all needs met.   Therapy Documentation Precautions:  Precautions Precautions: Fall, Other (comment) Precaution Comments: L hemiplegia, poor postural control Restrictions Weight Bearing Restrictions: No     Therapy/Group: Individual Therapy  AKayleen Memos9/19/2023, 7:43 AM

## 2022-08-07 NOTE — Progress Notes (Signed)
PROGRESS NOTE   Subjective/Complaints:  No issues overnite , shoulder pain somewhat better  ROS- loose stools occ incont, no abd pain , no nausea      08/07/2022    3:47 AM 08/06/2022    6:23 PM 08/06/2022    1:07 PM  Vitals with BMI  Systolic [phone]  Diastolic 75 72 69  Pulse 59 61 65     ROS: Denies fevers, chills, N/V, abdominal pain, constipation, diarrhea, SOB, cough, chest pain, new weakness or paraesthesias.    Objective:   No results found. No results for input(s): "WBC", "HGB", "HCT", "PLT" in the last 72 hours. No results for input(s): "NA", "K", "CL", "CO2", "GLUCOSE", "BUN", "CREATININE", "CALCIUM" in the last 72 hours.  Intake/Output Summary (Last 24 hours) at 08/07/2022 0821 Last data filed at 08/07/2022 0743 Gross per 24 hour  Intake 1140 ml  Output 825 ml  Net 315 ml         Physical Exam: Vital Signs reviewed as above.   General: No acute distress Mood and affect are appropriate Heart: Regular rate and rhythm no rubs murmurs or extra sounds Lungs: Clear to auscultation, breathing unlabored, no rales or wheezes Abdomen: Positive bowel sounds, soft nontender to palpation, nondistended Extremities: No clubbing, cyanosis, or edema     Neurologic: Cranial nerves II through XII intact, motor strength is 5/5 in RIght deltoid, bicep, tricep, grip, hip flexor, knee extensors, ankle dorsiflexor and plantar flexor 2- triceps o/w 0/5 LUE, 3- HF and KE , 2- hamstrings on Left o/w 0/5  Sensory exam normal sensation to light touch and proprioception in bilateral upper and lower extremities Tone- MAS 2 in Left pec-mildly decreased vs prior  Musculoskeletal: Full range of motion in all 4 extremities. No joint swelling, Left shoulder  pain - improved with abd, ext rotation        Assessment/Plan: 1. Functional deficits which require 3+ hours per day of interdisciplinary therapy in a comprehensive  inpatient rehab setting. Physiatrist is providing close team supervision and 24 hour management of active medical problems listed below. Physiatrist and rehab team continue to assess barriers to discharge/monitor patient progress toward functional and medical goals  Care Tool:  Bathing    Body parts bathed by patient: Left arm, Chest, Abdomen, Front perineal area, Buttocks, Right upper leg, Left upper leg, Right lower leg, Left lower leg, Face   Body parts bathed by helper: Right arm     Bathing assist Assist Level: Minimal Assistance - Patient > 75%     Upper Body Dressing/Undressing Upper body dressing   What is the patient wearing?: Pull over shirt    Upper body assist Assist Level: Minimal Assistance - Patient > 75%    Lower Body Dressing/Undressing Lower body dressing      What is the patient wearing?: Pants, Underwear/pull up     Lower body assist Assist for lower body dressing: Moderate Assistance - Patient 50 - 74%     Toileting Toileting    Toileting assist Assist for toileting: Maximal Assistance - Patient 25 - 49% Assistive Device Comment: bedpan   Transfers Chair/bed transfer  Transfers assist  Chair/bed transfer activity did  not occur: Safety/medical concerns  Chair/bed transfer assist level: Minimal Assistance - Patient > 75% (squat pivot to R side)     Locomotion Ambulation   Ambulation assist   Ambulation activity did not occur: Safety/medical concerns  Assist level: 2 helpers (mod A of 1 and +2 w/c follow) Assistive device: Other (comment) (R hallway rail) Max distance: 38f   Walk 10 feet activity   Assist  Walk 10 feet activity did not occur: Safety/medical concerns  Assist level: Maximal Assistance - Patient 25 - 49% Assistive device: Other (comment) (right wall hand railing)   Walk 50 feet activity   Assist Walk 50 feet with 2 turns activity did not occur: Safety/medical concerns         Walk 150 feet  activity   Assist Walk 150 feet activity did not occur: Safety/medical concerns         Walk 10 feet on uneven surface  activity   Assist Walk 10 feet on uneven surfaces activity did not occur: Safety/medical concerns         Wheelchair     Assist Is the patient using a wheelchair?: Yes Type of Wheelchair: Manual    Wheelchair assist level: Dependent - Patient 0%      Wheelchair 50 feet with 2 turns activity    Assist        Assist Level: Dependent - Patient 0%   Wheelchair 150 feet activity     Assist      Assist Level: Dependent - Patient 0%   Blood pressure 125/75, pulse (!) 59, temperature 98.4 F (36.9 C), temperature source Oral, resp. rate 18, height '5\' 10"'$  (1.778 m), weight 79.8 kg, SpO2 96 %.  Assessment and plan: 1. 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  Antithrombotics: -DVT/anticoagulation:  Pharmaceutical: Lovenox             -antiplatelet therapy: Aspirin 81 mg daily and Plavix 75 mg day x3 weeks (end 9/22) then aspirin alone 3. Pain Management: Tylenol as needed 4. Mood/Behavior/Sleep: Provide emotional support             -antipsychotic agents: N/A             - 9/17 - inc trazodone form 50 to 75 mg qhs  5. Neuropsych/cognition: This patient is capable of making decisions on his own behalf. 6. Skin/Wound Care: Routine skin checks 7. Fluids/Electrolytes/Nutrition: Routine in and outs with follow-up chemistries Discussed with patient importance of fluid intake to prevent dehydration, Will ask nursing to encourage fluids 8.  Permissive hypertension.  Decrease Losartan to '25mg'$ , continue this dose              9/15- BP controlled- cont' regimen             9/17 - BP well controlled    08/07/2022    3:47 AM 08/06/2022    6:23 PM 08/06/2022    1:07 PM  Vitals  with BMI  Systolic [phone]  Diastolic 75 72 69  Pulse 59 61 65      9.  Hyperlipidemia.  continue Crestor  10.  COnstipation add sennaS 2 po BID              -Order sorbitol             9/15- LBM 9/13- if no BM by tomorrow- will need sorbitol again.              9/16 - x2 BM   11.  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

## 2022-08-07 NOTE — Progress Notes (Signed)
Occupational Therapy Session Note  Patient Details  Name: Daniel [name] MRN: [id number] Date of Birth: 03-04-1946  Today's Date: 08/07/2022 OT Individual Time: 1435-1515 OT Individual Time Calculation (min): 40 min    Short Term Goals: Week 2:  OT Short Term Goal 1 (Week 2): pt will complete UB dressing from EOB with CGA and CGA for dynamic sitting balance OT Short Term Goal 2 (Week 2): pt will complete 1 part of LB dressing with MIN A OT Short Term Goal 3 (Week 2): pt will incorporate LUE into ADLs with < 3 cues during functional mobility and transfers  Skilled Therapeutic Interventions/Progress Updates:    Patient received seated in wheelchair - watching videos on his tablet.  Patient agreeable to OT.  Transported patient to gym to address postural control, stand balance, and neuromuscular reeducation to LUE.  In sitting patient initially not able to activate left arm - but with emphasis on shifting weight to left - patient able to increase tension in his arm toward shoulder flexion, elbow flexion.  In supine patient able to isolate elbow flexion and extension following facilitation.  Transitioned to sitting to address sit to and from standing in midline - narrowing base of support, and encouraging use of LLE.  Worked on shifting weight onto active LLE to allow sidestepping with RLE.  Patient needed frequent cueing for head and trunk alignment in standing.   Patient transported back to room and he opted to remain up in wheelchair.  Safety belt in place and engaged, and call bell / personal items in reach.    Therapy Documentation Precautions:  Precautions Precautions: Fall, Other (comment) Precaution Comments: L hemiplegia, poor postural control Restrictions Weight Bearing Restrictions: No  Pain: Pain Assessment Pain Score: 0-No pain     Therapy/Group: Individual Therapy  Mariah Milling 08/07/2022, 3:21 PM

## 2022-08-07 NOTE — Progress Notes (Signed)
Occupational Therapy Session Note  Patient Details  Name: Daniel [name] MRN: [id number] Date of Birth: 08-Jan-1946  Today's Date: 08/07/2022 OT Individual Time: 9233-0076 OT Individual Time Calculation (min): 60 min    Short Term Goals: Week 2:  OT Short Term Goal 1 (Week 2): pt will complete UB dressing from EOB with CGA and CGA for dynamic sitting balance OT Short Term Goal 2 (Week 2): pt will complete 1 part of LB dressing with MIN A OT Short Term Goal 3 (Week 2): pt will incorporate LUE into ADLs with < 3 cues during functional mobility and transfers  Skilled Therapeutic Interventions/Progress Updates:    Pt received in bed agreeable to a shower.  Pt worked on bed mobility with cues for managing his LUE as he rolled onto his R side.  Min A to push up to sit.  He transferred to w/c with min A.  Pt brushed his "gums" at the sink.  Pt taken to shower. In the shower, he had a BSC set up but w/c was not able to get extremely close to seat due to bars of commode. Pt cued to lean forward and make sure he fully pivots hips to seat to his L with R hand on bar.  Pt instead "dove in" and then needed max A to ensure he safely landed on seat.  (After shower placed shower seat instead of BSC to allow a closer placement of wc).  In shower pt was able to maintain sit balance and used the long handled sponge to reach under R arm, feet and back.  He used cloth on his R thigh to rub R forearm on to wash it.  He washed front perineal area and had A with bottom.  After drying off and coming out of shower, completed transfer to his R with close Supervision!  Recalled hemi dressing techniques well, min A to get shirt sleeve started over arm.   Then pt completed dressing. He needed min A to start left toes but then completed donning over feet.  Worked on standing using R hand on bed rail 2x (once for briefs, once for pants). He can stand with close S to enable caregiver to pull pants over hips.  Attempted to have pt  let go of rail to pull pants up but he was not able to balance safely even with mod A as he started to lean back.  Pt sat back down and completed a squat pivot to his R to bed with CGA.  Cues for forward lean to scoot hips back on bed vs trying to thrust his hips back.   Pt moved to supine with min A to maintain alignment as pt tends to lean back.   Pt settled bed with L arm supported by pillows.  Bed alarm set and all needs met.   Therapy Documentation Precautions:  Precautions Precautions: Fall, Other (comment) Precaution Comments: L hemiplegia, poor postural control Restrictions Weight Bearing Restrictions: No  Pain: Pain Assessment Pain Scale: 0-10 Pain Score: 0-No pain    Therapy/Group: Individual Therapy  [place] 08/07/2022, 12:03 PM

## 2022-08-08 ENCOUNTER — Ambulatory Visit: Payer: Medicare HMO | Admitting: Family Medicine

## 2022-08-08 NOTE — Progress Notes (Deleted)
    SUBJECTIVE:   CHIEF COMPLAINT / HPI:   * CVA (cerebral vascular accident) [facility]) Patient presented to the ED with trouble walking and left lower extremity weakness. MRI showed small acute infarct right centrum semiovale.  Neurology was consulted and recommended 3 weeks DAPT x3 weeks with ASA continuing after, patient was also started on statin.  A few days later, he began to have worsening left lower extremity weakness and new onset right upper extremity weakness.  A repeat MRI was done that showed extension of the original infarct.  Neurology had no change of management and allowed for permissive hypertension for 24 to 48 hours with gradual lowering within 3 to 5 days. Patient was restarted on home Losartan. PT/OT recommended CIR and patient was discharged for physical rehab.   Essential hypertension Patient remained hypertensive during his hospital stay due to permissive hypertension for his stroke.  Neurology recommended present hypertension for 24 to 48 hours.  He was discharged to CIR, where his blood pressure is to be monitored on his home Losartan   Follow up: Monitor blood pressure. Patient was discharged with home Losartan and goal of blood pressure <130/80 as long as patient is able to tolerate. DAPT with ASA 81 and Plavix '75mg'$  x3 weeks (end 9/22) followed by ASA alone.  Rosuvastatin started in the hospital, ensure compliance Follow up outpatient with neurology.    PERTINENT  PMH / PSH: ***  OBJECTIVE:   There were no vitals taken for this visit.  ***  ASSESSMENT/PLAN:   No problem-specific Assessment & Plan notes found for this encounter.     Lind Covert, MD [place]

## 2022-08-08 NOTE — Patient Care Conference (Signed)
Inpatient RehabilitationTeam Conference and Plan of Care Update Date: 08/08/2022   Time: 10:47 AM    Patient Name: Daniel [name]      Medical Record Number: [id number]  Date of Birth: [birth year] Sex: Male         Room/Bed: 4M10C/4M10C-01 Payor Info: Payor: [employer] MEDICARE / Plan: [employer] MEDICARE HMO/PPO / Product Type: *No Product type* /    Admit Date/Time:  07/24/2022 10:57 PM  Primary Diagnosis:  CVA (cerebral vascular accident) [facility])  Hospital Problems: Principal Problem:   CVA (cerebral vascular accident) [facility]) Active Problems:   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 [facility])    Expected Discharge Date: Expected Discharge Date: 08/15/22  Team Members Present: Physician leading conference: Dr. Alysia Penna Social Worker Present: Erlene Quan, BSW Nurse Present: Dorien Chihuahua, RN PT Present: Page Spiro, PT OT Present: Precious Haws, COTA;Other (comment) Antony Salmon OT) SLP Present: Sherren Kerns, SLP PPS Coordinator present : Gunnar Fusi, SLP     Current Status/Progress Goal Weekly Team Focus  Bowel/Bladder   Continent/incontinent of b/b. LBM: 9/19  Offer/Assist toileting q 2-4 hrs as needed to encourage continence  Assess toileting needs q shift & PRN   Swallow/Nutrition/ Hydration   dys 2 diet with no signfiicant difficulty, supervision A  Dys 3-regular, Supervision  dysphagia 3 trial tray, pt fearful of upgraded textures   ADL's   min A with bathing only for bottom, min UB to start shirt over L arm, mod A LB dressing, min squat pivots, able to hold stand balance with close S with R hand on bar for support.  Max A with stand balance with no UE support.  No LUE movement.  min A with shower transfer,bathing, LB dressing, toileting; CGA toilet transfer and sit to stand  ADL training, LUE NMR, estim, balance, functional mobility, cognition   Mobility   AFO consult. supine >sit mod A, stand pivot transfer min/mod A, sit to stand min A, began  amb with RW and [place] Sprystep Plus GRAFO performed 10f x 6 with mod A +2 for balance, weight shift, and L LE placement.  dynamic sitting SPV, dynamic standing min A, bed mobility SPV, transfers CGA, gait CGA 1061fwith LRAD, stairs min A 4 steps B HRs  AFO consult, amb with RW, gait training, transfer training, bed mobility, balance, stair training.   Communication   mod I-to-sup A for speech intelligibility  mod I  speech intelligibility strategies   Safety/Cognition/ Behavioral Observations  min A  sup A  memory with external aids, problem solving, selective attention, awareness   Pain   Pt denies pain.  Remain pain free.  Assess pain q shift & PRN   Skin   Bruising to bilat. abd & arms  Remain free from skin breakdown.  Assess skin q shift & PRN     Discharge Planning:  Discharging back home with daughter to assist able to provide up to [place]7 supervision avaliable   Team Discussion: Patient with left hemiparesis post CVA. HTN controlled per MD and pain in shoulder improved post Botox.  Patient on target to meet rehab goals: yes, currently needs mi assist for bathing and upper body dressing with mod assist for lower body dressing. Completes squat pivot with min assist. Completes sit - stand with min assist and can ambulate up to 4030with mod assist using a RW. Goals for discharge set for CGA overall, with min assist for steps.  *See Care Plan and progress notes for  long and short-term goals.   Revisions to Treatment Plan:  AFO consult D3 diet trials   Teaching Needs: Safety, medications, dietary modifications, transfers, toileting, etc.  Current Barriers to Discharge: Decreased caregiver support and Home enviroment access/layout; ramp installation in the works.  Possible Resolutions to Barriers: Family education HH follow up services DME: hospital bed, SS, BSC, WC, RW     Medical Summary Current Status: Left pec tone starting to improve post Botox,  Barriers to  Discharge: Medical stability;Other (comments)  Barriers to Discharge Comments: spasticity management Possible Resolutions to Barriers/Weekly Focus: AFO consult is pending with orthotist, cont ROM for Left shoulder tone , family training prior to d/c next week   Continued Need for Acute Rehabilitation Level of Care: The patient requires daily medical management by a physician with specialized training in physical medicine and rehabilitation for the following reasons: Direction of a multidisciplinary physical rehabilitation program to maximize functional independence : Yes Medical management of patient stability for increased activity during participation in an intensive rehabilitation regime.: Yes Analysis of laboratory values and/or radiology reports with any subsequent need for medication adjustment and/or medical intervention. : Yes   I attest that I was present, lead the team conference, and concur with the assessment and plan of the team.   Dorien Chihuahua B 08/08/2022, 3:07 PM

## 2022-08-08 NOTE — Progress Notes (Signed)
Occupational Therapy Session Note  Patient Details  Name: Daniel [name] MRN: [id number] Date of Birth: 10/21/1946  Today's Date: 08/08/2022 OT Individual Time: [phone] OT Individual Time Calculation (min): 70 min    Short Term Goals: Week 2:  OT Short Term Goal 1 (Week 2): pt will complete UB dressing from EOB with CGA and CGA for dynamic sitting balance OT Short Term Goal 2 (Week 2): pt will complete 1 part of LB dressing with MIN A OT Short Term Goal 3 (Week 2): pt will incorporate LUE into ADLs with < 3 cues during functional mobility and transfers  Skilled Therapeutic Interventions/Progress Updates:  Pt greeted supine in bed, pt agreeable to OT intervention.pt completed supine>sit with MOD A to L side needing assist to maneuver BLEs to EOB and elevate trunk into sitting. Pt donned pants from EOB with overall MIN A, pt needed assist to thread LLE but able to stand with RW with MIN A and pull pants up to waist up line on both side with MIN A for balance. Pt completed squat pivot transfer from EOb>recliner with MIN A to R side. Total A transport to gym in w/c.  1:1 NMES applied to L wrist extensors at the below settings:    Ratio 1:3 Rate 35 pps Waveform- Asymmetric Ramp 1.0 Pulse 300 Intensity- 27 Duration -   10 mins No pain or adverse reactions noted after treatment.    NMR standing task where pt instructed to sit>stand at hi lo table with Rw with MIN A with instructed to engage in standing checkers game with an emphasis on LLE NMR, pt with ability to activate L quad but presents with motor impersistencies unable to sustain muscle contraction in L quad for longer than 10 secs. Worked on [employer] through LUE  for NMR to reach dynamically to game board, pt required up to [facility] for dynamic standing balance.  Pt transported back to room with total A where pt left up in w/c with alarm belt activated and all needs within reach.  Therapy Documentation Precautions:  Precautions Precautions:  Fall, Other (comment) Precaution Comments: L hemiplegia, poor postural control Restrictions Weight Bearing Restrictions: No    Pain: no pain reported during session.     Therapy/Group: Individual Therapy  Corinne Ports [facility] 08/08/2022, 10:26 AM

## 2022-08-08 NOTE — Progress Notes (Signed)
Recreational Therapy Session Note  Patient Details  Name: Daniel [name] MRN: [id number] Date of Birth: 1946/11/05 Today's Date: 08/08/2022  Pain: no c/o Skilled Therapeutic Interventions/Progress Updates: Goal:  Pt will maintain dynamic standing balance with mod assist for mod complex TR task.  Session focused on activity tolerance, dynamic standing balance, L knee control during co-treat with PT.  Pt stood with mod assist fading to intermittent contact guard assist for corn hole activity.  Pt stood reaching outside BOS in all directions.    Therapy/Group: Co-Treatment  Activity Level:   [name] 08/08/2022, 3:01 PM

## 2022-08-08 NOTE — Progress Notes (Signed)
Orthopedic Tech Progress Note Patient Details:  Daniel [name] Jan 02, 1946 [id number]  Called in order to [facility] for an AFO CONSULT   Patient ID: Daniel [name], male   DOB: June 28, 1946, 76 y.o.   MRN: [id number]  [name] 08/08/2022, 10:57 AM

## 2022-08-08 NOTE — Progress Notes (Signed)
Physical Therapy Session Note  Patient Details  Name: Daniel [name] MRN: [id number] Date of Birth: 10/14/1946  Today's Date: 08/08/2022 PT Individual Time: 8676-7209 PT Individual Time Calculation (min): 56 min   Short Term Goals: Week 1:  PT Short Term Goal 1 (Week 1): Pt will perform supine<>sit with mod assist consistently PT Short Term Goal 1 - Progress (Week 1): Met PT Short Term Goal 2 (Week 1): Pt will perform sit<>stands using LRAD with mod assist PT Short Term Goal 2 - Progress (Week 1): Met PT Short Term Goal 3 (Week 1): Pt will perform bed<>chair transfers using LRAD with mod assist PT Short Term Goal 3 - Progress (Week 1): Met PT Short Term Goal 4 (Week 1): Pt will ambulate at least 4f with +2 mod assist PT Short Term Goal 4 - Progress (Week 1): Met Week 2:  PT Short Term Goal 1 (Week 2): Pt will perform supine<>sit with min assist consistently PT Short Term Goal 2 (Week 2): Pt will perform sit<> stand with min assist consistently PT Short Term Goal 3 (Week 2): Pt will perform squat pivot transfer with min assist consistently PT Short Term Goal 4 (Week 2): Pt will ambulate +630fwith mod assist using LRAD (+2 WC as needed for safety). PT Short Term Goal 5 (Week 2): Pt will initiate stair training.  Skilled Therapeutic Interventions/Progress Updates:  Patient seated in recliner on entrance to room. Patient alert and agreeable to PT session. Co treat this session with Recreation Therapist. ThFlorence CannerprystepPlus GRAFO donned for this session.   Patient with no pain complaint at start of session.  Therapeutic Activity: Transfers: Pt performed sit<>stand and stand pivot transfers throughout session with overall heavy minA initially and improving to light MinA by end of session. Pt demonstrates understanding of movements and setup required for stand pivot transfers recliner <> w/c and so does not require vc for technique using no AD. However with sit<>stand transfers during  standing balance challenge, vc provided for forward scoot, more posterior positioning of LLE prior to rise to stand.   Gait Training/ NMR:  Pre-gait training focus in order to improve LLE advancement during gait. Using RW, color disc placed on floor anterior to L foot. Pt guided in hip/ knee flexion with increasing muscle involvement for foot clearance anterior then posterior and off of disc. Initially pt extends UB, neck and trunk to lift knee up. PT support changed to prevent L shoulder raise and trunk extension. Focus shifting to high knee performance and increased L quad activation following AAROM with pt inititiating movement and then able to perform AROM. Return to hip flexion with knee extension to bring L foot to disc with improved performance. Pt also able to better initiate hip flexion then knee flexion to step back from disc.  Neuromuscular Re-ed: NMR facilitated during session with focus on standing balance and proprioception. Pt guided in bean bag retrieval from differing heights and distances followed by reactive balance training with toss of bag to target 15 ft away. Variable physical assist provided to pt to maintain balance throughout. Up to [facility] post/ ant and L sided LOB. One instance of MaxA required to correct post LOB. Pt allowed to "fail" and noted initiation of balance correction with vc. Provided with questioning cues re: balance. RT holds bean bags just at reach of pt's BOS to R and L as well as overhead and to just below waist height. No AD used. Stands with L knee flexed until provided with  vc for knee extension or standing up tall. With significant reach to R side, pt's L quad produces significant activation with L knee full extension to assist with reach to target bag. With throwing of bags, fair balance noted with Min up to mod assist required to maintain. L knee flexion throughout.  NMR performed for improvements in motor control and coordination, balance, sequencing,  judgement, and self confidence/ efficacy in performing all aspects of mobility at highest level of independence.   Patient seated in recliner at end of session with brakes locked, belt alarm set, and all needs within reach.  Therapy Documentation Precautions:  Precautions Precautions: Fall, Other (comment) Precaution Comments: L hemiplegia, poor postural control Restrictions Weight Bearing Restrictions: No General:   Vital Signs:   Pain: Light pain noted in neck following bodily extension compensatory movement in high knee performance. Resolves with seated rest.    Therapy/Group: Individual Therapy  Alger Simons PT, DPT, CSRS 08/08/2022, 2:01 PM

## 2022-08-08 NOTE — Progress Notes (Signed)
PROGRESS NOTE   Subjective/Complaints:  No issues overnite , slept well, getting some gravity eliminated shoulder abd with OT   ROS- loose stools occ incont, no abd pain , no nausea      08/07/2022   11:07 PM 08/07/2022    6:27 PM 08/07/2022    2:20 PM  Vitals with BMI  Weight 174 lbs 13 oz    BMI 92.33    Systolic  007 622  Diastolic  75 75  Pulse  80 106       Objective:   No results found. No results for input(s): "WBC", "HGB", "HCT", "PLT" in the last 72 hours. No results for input(s): "NA", "K", "CL", "CO2", "GLUCOSE", "BUN", "CREATININE", "CALCIUM" in the last 72 hours.  Intake/Output Summary (Last 24 hours) at 08/08/2022 0843 Last data filed at 08/08/2022 0814 Gross per 24 hour  Intake 660 ml  Output 400 ml  Net 260 ml         Physical Exam: Vital Signs reviewed as above.   General: No acute distress Mood and affect are appropriate Heart: Regular rate and rhythm no rubs murmurs or extra sounds Lungs: Clear to auscultation, breathing unlabored, no rales or wheezes Abdomen: Positive bowel sounds, soft nontender to palpation, nondistended Extremities: No clubbing, cyanosis, or edema     Neurologic: Cranial nerves II through XII intact, motor strength is 5/5 in RIght deltoid, bicep, tricep, grip, hip flexor, knee extensors, ankle dorsiflexor and plantar flexor 2- triceps o/w 0/5 LUE, 3- HF and KE , 2- hamstrings on Left o/w 0/5  Sensory exam normal sensation to light touch and proprioception in bilateral upper and lower extremities Tone- MAS 2 in Left pec-mildly decreased vs prior  Musculoskeletal: Full range of motion in all 4 extremities. No joint swelling, Left shoulder  pain - improved with abd, ext rotation        Assessment/Plan: 1. Functional deficits which require 3+ hours per day of interdisciplinary therapy in a comprehensive inpatient rehab setting. Physiatrist is providing close team  supervision and 24 hour management of active medical problems listed below. Physiatrist and rehab team continue to assess barriers to discharge/monitor patient progress toward functional and medical goals  Care Tool:  Bathing    Body parts bathed by patient: Left arm, Chest, Abdomen, Front perineal area, Buttocks, Right upper leg, Left upper leg, Right lower leg, Left lower leg, Face   Body parts bathed by helper: Right arm     Bathing assist Assist Level: Minimal Assistance - Patient > 75%     Upper Body Dressing/Undressing Upper body dressing   What is the patient wearing?: Pull over shirt    Upper body assist Assist Level: Minimal Assistance - Patient > 75%    Lower Body Dressing/Undressing Lower body dressing      What is the patient wearing?: Pants, Underwear/pull up     Lower body assist Assist for lower body dressing: Moderate Assistance - Patient 50 - 74%     Toileting Toileting    Toileting assist Assist for toileting: Maximal Assistance - Patient 25 - 49% Assistive Device Comment: bedpan   Transfers Chair/bed transfer  Transfers assist  Chair/bed transfer activity  did not occur: Safety/medical concerns  Chair/bed transfer assist level: Minimal Assistance - Patient > 75% (squat pivot to R side)     Locomotion Ambulation   Ambulation assist   Ambulation activity did not occur: Safety/medical concerns  Assist level: 2 helpers Assistive device: Walker-rolling Max distance: 70f   Walk 10 feet activity   Assist  Walk 10 feet activity did not occur: Safety/medical concerns  Assist level: 2 helpers Assistive device: Walker-rolling   Walk 50 feet activity   Assist Walk 50 feet with 2 turns activity did not occur: Safety/medical concerns         Walk 150 feet activity   Assist Walk 150 feet activity did not occur: Safety/medical concerns         Walk 10 feet on uneven surface  activity   Assist Walk 10 feet on uneven surfaces  activity did not occur: Safety/medical concerns         Wheelchair     Assist Is the patient using a wheelchair?: Yes Type of Wheelchair: Manual    Wheelchair assist level: Dependent - Patient 0%      Wheelchair 50 feet with 2 turns activity    Assist        Assist Level: Dependent - Patient 0%   Wheelchair 150 feet activity     Assist      Assist Level: Dependent - Patient 0%   Blood pressure 123/75, pulse 80, temperature 98 F (36.7 C), resp. rate 17, height _0  (1.778 m), weight 79.3 kg, SpO2 95 %.  Assessment and plan: 1. 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 Formulized individual treatment plan based on medical history, underlying problem and comorbidities.  2.  Antithrombotics: -DVT/anticoagulation:  Pharmaceutical: Lovenox             -antiplatelet therapy: Aspirin 81 mg daily and Plavix 75 mg day x3 weeks (end 9/22) then aspirin alone 3. Pain Management: Tylenol as needed 4. Mood/Behavior/Sleep: Provide emotional support             -antipsychotic agents: N/A             - 9/17 - inc trazodone form 50 to 75 mg qhs  5. Neuropsych/cognition: This patient is capable of making decisions on his own behalf. 6. Skin/Wound Care: Routine skin checks 7. Fluids/Electrolytes/Nutrition: Routine in and outs with follow-up chemistries Discussed with patient importance of fluid intake to prevent dehydration, Will ask nursing to encourage fluids 8.  Permissive hypertension.  Decrease Losartan to 296m continue this dose              9/15- BP controlled- cont' regimen             9/17 - BP well controlled    08/07/2022   11:07 PM 08/07/2022    6:27 PM 08/07/2022    2:20 PM  Vitals  with BMI  Weight 174 lbs 13 oz    BMI 48.14    Systolic  439 265  Diastolic  75 75  Pulse  80 106      9.  Hyperlipidemia.  continue Crestor  10.  COnstipation add sennaS 2 po BID              -Order sorbitol             9/15- LBM 9/13- if no BM by tomorrow- will need sorbitol again.              9/16 - x2 BM   11.  Left shoulder pain related to spasticity -should take effect ~1wk              - Xeomin 150U injection 9/15  LOS: 15 days A FACE TO [place] E Latiffany Harwick 08/08/2022, 8:43 AM

## 2022-08-08 NOTE — Progress Notes (Signed)
Patient ID: Daniel [name], male   DOB: 1946-03-30, 76 y.o.   MRN: [id number]  Team Conference Report to Patient/Family  Team Conference discussion was reviewed with the patient and caregiver, including goals, any changes in plan of care and target discharge date.  Patient and caregiver express understanding and are in agreement.  The patient has a target discharge date of 08/15/22.   Sw spoke with patient daughter and provided team conference updates. Family education scheduled on Monday and Tuesday 9-12. Sw informed patient daughter of DME needed.   Dyanne Iha 08/08/2022, 1:46 PM

## 2022-08-08 NOTE — Progress Notes (Signed)
Physical Therapy Session Note  Patient Details  Name: Daniel [name] MRN: [id number] Date of Birth: 1945/11/26  Today's Date: 08/08/2022 PT Individual Time: 1106-1203 PT Individual Time Calculation (min): 57 min   Short Term Goals: Week 2:  PT Short Term Goal 1 (Week 2): Pt will perform supine<>sit with min assist consistently PT Short Term Goal 2 (Week 2): Pt will perform sit<> stand with min assist consistently PT Short Term Goal 3 (Week 2): Pt will perform squat pivot transfer with min assist consistently PT Short Term Goal 4 (Week 2): Pt will ambulate +18f with mod assist using LRAD (+2 WC as needed for safety). PT Short Term Goal 5 (Week 2): Pt will initiate stair training.  Skilled Therapeutic Interventions/Progress Updates:    [facility] with no c/o pain.   Pt transported down to main therapy gym for time management.  Pt wearing L Thuasne Sprystep Plus GRAFO and L leg lifting assistance strap thoughout session today.  Continued practice with sit <> stand transfers x8 from various surfaces such as mat table, standard chair with no arm rests, and WC. Pt required min assist for balance and trunk control with sit <> stand transfers to RW and cues needed for fwd trunk lean, and left foot placement. Pt able to strap left hand into RW hand splint independently, however requires assistance to unstrap. Pt uses right hand to place left hand into splint. SPT supports elbow during transfers. Pt showing improved understanding of fwd flexing at the hip and trunk prior to standing up and sitting down.   Gait training  Four trials total. 481fx 2 and 8024f 1 with RW and mod assist +2 with PT directing RW and SPT assisting at trunk and with manual facilitation of L LE.  80 ft x 1 with mod assist + 1 for balance, RW management, and L LE swing phase facilitation.  Pt demos decreased postural awareness and delayed ability to recognize when L foot has not stepped  through or when pt is losing balance. Heavy cues needed for first two trials for weight shifting over R LE to ease L LE swing through, progressing to moderate cues with last two trials. Cues also needed to decrease step width of R LE to increase ease with weight shifting over R LE and pt able to correct well with last two trials of gait. Pt also demos a fwd pelvic lean occasionally that requires cues and assistance for correction.  Turning performed during gait with mod assist and RW. Pt needing cues for sequencing, foot placement, and posture. Directed pt to abduct L LE more while bringing R LE around and pt able to perform with assistance. Manual assistance also needed with back stepping of L LE.   Static standing balance challenged today with mod assist for balance and mirror feedback. Pt has increased difficulty with midline standing, equal weightbearing, and ability to place B LE's in a stance for optimal balance. Cues needed to correct feet and manual facilitation required for L foot mobility.   Pt left sitting up in recliner, seat alarm on, call bell in reach, and all needs met.    Therapy Documentation Precautions:  Precautions Precautions: Fall, Other (comment) Precaution Comments: L hemiplegia, poor postural control Restrictions Weight Bearing Restrictions: No   Therapy/Group: Individual Therapy  AnnKayleen MemosPT 08/08/2022, 7:47 AM

## 2022-08-08 NOTE — Plan of Care (Signed)
Goal advanced due to improvement Problem: RH Attention Goal: LTG Patient will demonstrate this level of attention during functional activites (SLP) Description: LTG:  Patient will will demonstrate this level of attention during functional activites (SLP) Flowsheets (Taken 08/08/2022 1050) Patient will demonstrate during cognitive/linguistic activities the attention type of: Selective

## 2022-08-09 NOTE — Progress Notes (Signed)
Physical Therapy Weekly Progress Note  Patient Details  Name: Daniel [name] MRN: [id number] Date of Birth: [birth year]  Beginning of progress report period: August 01, 2022 End of progress report period: August 09, 2022  Today's Date: 08/09/2022 PT Individual Time: 4098-1191 PT Individual Time Calculation (min): 80 min   Patient has met 4 of 5 short term goals.    Daniel [name] is progressing well with therapy evident by improving strength, endurance, balance, midline orientation, and coordination. Pt has progressed to performing bed mobility with min assist, sit to stand transfers and squat pivot transfers with min assist, gait with mod assistance +2. Assistance needed for balance, trunk management and L UE and LE management. Pt improving in weight shifting to step and is requiring less assistance with L LE swing through during gait. He would continue to benefit from skilled CIR level therapy to further decrease assistance needed for mobility, increase independence, and decrease fall risk. Planning to DC home with 24 hour support from his daughter and family education scheduled 9/25 and 9/26.   Patient continues to demonstrate the following deficits muscle weakness and muscle paralysis, decreased cardiorespiratoy endurance, impaired timing and sequencing, unbalanced muscle activation, decreased coordination, and decreased motor planning,  , decreased midline orientation, decreased safety awareness,  , and decreased sitting balance, decreased standing balance, decreased postural control, and decreased balance strategies and therefore will continue to benefit from skilled PT intervention to increase functional independence with mobility.  Patient not progressing toward long term goals.  See goal revision..  Continue plan of care.   PT Short Term Goals Week 1:  PT Short Term Goal 1 (Week 1): Pt will perform supine<>sit with mod assist consistently PT Short Term Goal 1 - Progress (Week 1):  Met PT Short Term Goal 2 (Week 1): Pt will perform sit<>stands using LRAD with mod assist PT Short Term Goal 2 - Progress (Week 1): Met PT Short Term Goal 3 (Week 1): Pt will perform bed<>chair transfers using LRAD with mod assist PT Short Term Goal 3 - Progress (Week 1): Met PT Short Term Goal 4 (Week 1): Pt will ambulate at least 68f with +2 mod assist PT Short Term Goal 4 - Progress (Week 1): Met Week 2:  PT Short Term Goal 1 (Week 2): Pt will perform supine<>sit with min assist consistently PT Short Term Goal 2 (Week 2): Pt will perform sit<> stand with min assist consistently PT Short Term Goal 2 - Progress (Week 2): Met PT Short Term Goal 3 (Week 2): Pt will perform squat pivot transfer with min assist consistently PT Short Term Goal 3 - Progress (Week 2): Met PT Short Term Goal 4 (Week 2): Pt will ambulate +693fwith mod assist using LRAD (+2 WC as needed for safety). PT Short Term Goal 4 - Progress (Week 2): Progressing toward goal PT Short Term Goal 5 (Week 2): Pt will initiate stair training. PT Short Term Goal 5 - Progress (Week 2): Met Week 3:  PT Short Term Goal 1 (Week 3): = to LTGs based on ELOS  Skilled Therapeutic Interventions/Progress Updates:    Pt greeted supine in bed and agreeable to therapy. No c/o pain.   Supine> sit with min assist for trunk control and pt heavy use of right hand on hospital bed rail to pull self up and remain upright due to heavy posterior lean. Heavy cuing for fwd trunk lean to maintain balance on EOB.  Blocked practice of squat pivot transfers x 6. Pt required  min assist for trunk management and mod cuing for fwd trunk lean and leaning in opposite direction of butt. Tactile cues to right hand to push from bed and on bilateral glutes to lift butt off EOB and scoot. L knee blocked. Pt able to perform x3 to the right and x3 to the left with min assist. Pt able to place right hand independently when transfering to the right from [facility] to EOB.   Pt  transported down to main therapy gym for time management.  Thuasne Sprystep Plus GRAFO on L LE, L toe cap, and L leg lifting brace.   Sit <> stand throughout session from [facility] and mat table to RW with min assist. Pt doing well with sequencing and needs cues for bilateral LE placement. Minimal cues needed for fwd bending, which is an improvement from previous session. Pt also able to place L hand onto RW splint and strap down independently.   Placed 3#AW on L ankle for increased NMR during first trial of gait.   Amb trial 1: 40x2 ft with RW and mod assist + 2. SPT with mod/max assist for L LE placement and weight shifting and PT directing RW. Pt improving with weight shifting onto right LE and swing through of L LE, though still requires cuing both tactile and verbal. Min-mod manual facilitation and placement of L LE with pt presenting with L toe out and hip ER due to toe drag.  During first trial distance was limited by L ankle pain thought to be from brace. SPT and PT removed brace and checked skin noting blanchable redness on posterior heel. Adjusted brace in shoe and redonned. Removed ankle weight.  Pt wore for second set of 40 ft with no c/o pain.   Step taps x 3  with L LE performed with bilateral UE support on HR, 6 inch step, and min assist for balance and mod assist for L LE management. Manual facilitation of weight shift and cuing given for hip flexion with step tap on and hip flexion with knee flexion to bring foot back down. Pt continuing to struggle with weight shifting at hips and tends to laterally flex trunk and shoulders to the right. Pt reporting pain in ankle again. Changed AFO to Sprystep MAX GRAFO.    Last trial of gait: 80 ft with 3 musketeer assist. Min assistance given for L LE management due to improved swing through and placement, min assist given for right weight shifting. Cues to correct anterior pelvic shifting to improve balance and CoG. Pt continuing to struggle with right  weight shifting and control of L LE, but improving from evaluation.   Transported pt back to room via [facility] for time management.   Pt left sitting up in recliner with posey belt, call bell in reach, daughters present, and all needs met.   Therapy Documentation Precautions:  Precautions Precautions: Fall, Other (comment) Precaution Comments: L hemiplegia, poor postural control Restrictions Weight Bearing Restrictions: No    Therapy/Group: Individual Therapy  Kayleen Memos, SPT 08/09/2022, 3:06 PM

## 2022-08-09 NOTE — Plan of Care (Signed)
  Problem: RH Swallowing Goal: LTG Patient will consume least restrictive diet using compensatory strategies with assistance (SLP) Description: LTG:  Patient will consume least restrictive diet using compensatory strategies with assistance (SLP) Outcome: Completed/Met Goal: LTG Pt will demonstrate functional change in swallow as evidenced by bedside/clinical objective assessment (SLP) Description: LTG: Patient will demonstrate functional change in swallow as evidenced by bedside/clinical objective assessment (SLP) Outcome: Completed/Met   Problem: RH Expression Communication Goal: LTG Patient will increase speech intelligibility (SLP) Description: LTG: Patient will increase speech intelligibility at word/phrase/conversation level with cues, % of the time (SLP) Outcome: Completed/Met   Problem: RH Problem Solving Goal: LTG Patient will demonstrate problem solving for (SLP) Description: LTG:  Patient will demonstrate problem solving for basic/complex daily situations with cues  (SLP) Outcome: Completed/Met   Problem: RH Memory Goal: LTG Patient will demonstrate ability for day to day (SLP) Description: LTG:   Patient will demonstrate ability for day to day recall/carryover during cognitive/linguistic activities with assist  (SLP) Outcome: Completed/Met Goal: LTG Patient will use memory compensatory aids to (SLP) Description: LTG:  Patient will use memory compensatory aids to recall biographical/new, daily complex information with cues (SLP) Outcome: Completed/Met   Problem: RH Attention Goal: LTG Patient will demonstrate this level of attention during functional activites (SLP) Description: LTG:  Patient will will demonstrate this level of attention during functional activites (SLP) Outcome: Completed/Met   Problem: RH Awareness Goal: LTG: Patient will demonstrate awareness during functional activites type of (SLP) Description: LTG: Patient will demonstrate awareness during functional  activites type of (SLP) Outcome: Completed/Met

## 2022-08-09 NOTE — Progress Notes (Signed)
Speech Language Pathology Discharge Summary  Patient Details  Name: Daniel [name] MRN: [id number] Date of Birth: 22-Mar-1946  Date of Discharge from SLP service:August 09, 2022  Today's Date: 08/09/2022 SLP Individual Time: 0800-0900 SLP Individual Time Calculation (min): 60 min  Skilled Therapeutic Interventions:  Skilled ST treatment focused on cognitive and swallowing goals. Pt received awake in bed on arrival and in good spirits. SLP facilitated dysphagia 3 trial tray with effective and timely mastication, adequate oral clearance, minimal oral residuals, and delayed cough response x1 which may not have attributed to PO consumption due to delayed nature. No further s/sx concerning for airway invasion noted during meal. Pt denied any difficulty and appeared much more comfortable with consuming advanced textures this date. Pt also supports improved oral and facial sensation on left side which has improved his awareness of bolus. Pt exhibited occasional anterior spillage on weak/left side in which he independently cleared. Pt also independent with implementation of slow rate of consumption, small bite sizes, and repositioning self during meal as needed. SLP recommends diet advancement to dysphagia 3 textures. Anticipate pt will be able to progress to regular textures at discharge per pt's comfort level and tolerance. Further f/u for dysphagia is not necessary at this time.   Pt sustained attention for duration of session within a mildly distracting environment without need for verbal redirection. Pt utilized therapy schedule as external aid with mod I to recall his schedule today. Pt continues to implement speech intelligibility strategies with mod I at the conversation level and was perceived as >95% intelligible.   Per pt and daughter, pt is likely at baseline function from cognitive-linguistic, speech, and and swallowing perspectives. All goals have been met. Plan to discharge from SLP services,  as further SLP intervention is not clinically indicated at this time. Pt verbalized agreement with plan. Patient was left in bed with alarm activated and immediate needs within reach at end of session.   Patient has met 8 of 8 long term goals.  Patient to discharge at overall Supervision level.  Reasons goals not met: All goals met   Clinical Impression/Discharge Summary: Pt has demonstrated excellent progress towards swallowing, speech, and cognitive-linguistic goals meeting 8 out of 8 long-term goals this admission. Patient is currently completing functional and mildly complex cognitive tasks with supervision A cues in regards to problem solving, and mod I-to-sup A for attention, memory, awareness; mod I for implementation of speech intelligibility strategies; independent with implementation of swallowing precautions and strategies.  Pt is currently consuming a dysphagia 3 diet with thin liquids per pt comfort and preference. Anticipate pt will be able to progress to regular textures at discharge per pt's comfort level and tolerance. Pt/family feel pt is at cognitive-communication and swallow baseline with no further needs identified at this time. Pt/family education completed. Care partner is independent to provide the necessary physical and cognitive assistance at discharge. Recommend supervision and assistance with completion of all iADL tasks to include medication and financial management. Follow up SLP services are not clinically indicated. Pt will have necessary assistance from daughter at discharge.   Care Partner:  Caregiver Able to Provide Assistance: Yes  Type of Caregiver Assistance: Cognitive;Physical  Recommendation:  None     Equipment: none   Reasons for discharge: Treatment goals met   Patient/Family Agrees with Progress Made and Goals Achieved: Yes    Patty Sermons 08/09/2022, 12:37 PM

## 2022-08-09 NOTE — Progress Notes (Signed)
Occupational Therapy Weekly Progress Note  Patient Details  Name: Daniel [name] MRN: [id number] Date of Birth: 10-26-1946  Beginning of progress report period: August 03, 2022 End of progress report period: August 09, 2022  Today's Date: 08/09/2022 OT Individual Time: 1415-9733 OT Individual Time Calculation (min): 91 min    Patient has met 3 of 3 short term goals. Pt currently requires MINA for squat pivot transfers, CGA for UB dressing, MIN A for LB dressing, MIN A for bathing from shower level, MOD A for 3/3 toileting tasks,  MIN A for sit>stands with  RW. Pt continues to present with impaired body awareness, impaired balance, LUE hemiparesis ( getting some movement back in gravity eliminate position at proximal shoulder and elbow). Family ed has been scheduled with DC date set for 9/27  Patient continues to demonstrate the following deficits: impaired timing and sequencing, abnormal tone, unbalanced muscle activation, motor apraxia, decreased coordination, and decreased motor planning, decreased midline orientation, decreased attention to left, and decreased motor planning, decreased initiation, decreased attention, decreased safety awareness, decreased memory, and delayed processing, and decreased sitting balance, decreased standing balance, decreased postural control, hemiplegia, and decreased balance strategies and therefore will continue to benefit from skilled OT intervention to enhance overall performance with BADL.  Patient progressing toward long term goals..  Continue plan of care.  OT Short Term Goals Week 1:  OT Short Term Goal 1 (Week 1): Patient will sit at edge of bed with only intermittent min assist in preparation for a level surface transfer to wheelchair or commode OT Short Term Goal 1 - Progress (Week 1): Met OT Short Term Goal 2 (Week 1): Patient will don pull over shirt with min assist and verbal cueing after set up OT Short Term Goal 2 - Progress (Week 1):  Met OT Short Term Goal 3 (Week 1): Patient will don pants over left foot with mod assistance OT Short Term Goal 3 - Progress (Week 1): Met OT Short Term Goal 4 (Week 1): Patient will maintain static sittin gbalance while on commode with only intermittent min assistance OT Short Term Goal 4 - Progress (Week 1): Met OT Short Term Goal 5 (Week 1): Patient will bathe upper body with min assist Week 2:  OT Short Term Goal 1 (Week 2): pt will complete UB dressing from EOB with CGA and CGA for dynamic sitting balance OT Short Term Goal 1 - Progress (Week 2): Met OT Short Term Goal 2 (Week 2): pt will complete 1 part of LB dressing with MIN A OT Short Term Goal 2 - Progress (Week 2): Met OT Short Term Goal 3 (Week 2): pt will incorporate LUE into ADLs with < 3 cues during functional mobility and transfers OT Short Term Goal 3 - Progress (Week 2): Met Week 3:  OT Short Term Goal 1 (Week 3): LTG= STG   Therapy Documentation Precautions:  Precautions Precautions: Fall, Other (comment) Precaution Comments: L hemiplegia, poor postural control Restrictions Weight Bearing Restrictions: No    Therapy/Group: Individual Therapy  Corinne Ports [facility] 08/09/2022, 4:09 PM

## 2022-08-09 NOTE — Progress Notes (Signed)
PROGRESS NOTE   Subjective/Complaints:  SLP trying D3 diet , pt a bit apprehensive about advancing diet No cough or SOB  ROS-no shoulder pain, has fear of choking but no actual episode      08/09/2022    5:07 AM 08/08/2022    7:30 PM 08/08/2022    2:03 PM  Vitals with BMI  Systolic [phone]  Diastolic 65 73 87  Pulse 59 70 89       Objective:   No results found. No results for input(s): "WBC", "HGB", "HCT", "PLT" in the last 72 hours. No results for input(s): "NA", "K", "CL", "CO2", "GLUCOSE", "BUN", "CREATININE", "CALCIUM" in the last 72 hours.  Intake/Output Summary (Last 24 hours) at 08/09/2022 0814 Last data filed at 08/09/2022 0640 Gross per 24 hour  Intake 500 ml  Output 625 ml  Net -125 ml         Physical Exam: Vital Signs reviewed as above.   General: No acute distress Mood and affect are appropriate Heart: Regular rate and rhythm no rubs murmurs or extra sounds Lungs: Clear to auscultation, breathing unlabored, no rales or wheezes Abdomen: Positive bowel sounds, soft nontender to palpation, nondistended Extremities: No clubbing, cyanosis, or edema     Neurologic: Cranial nerves II through XII intact, motor strength is 5/5 in RIght deltoid, bicep, tricep, grip, hip flexor, knee extensors, ankle dorsiflexor and plantar flexor 2- triceps o/w 0/5 LUE, 3- HF and KE , 3- hamstrings on Left 2/5 toe flexion Sensory exam normal sensation to light touch and proprioception in bilateral upper and lower extremities Tone- MAS 2 in Left pec-mildly decreased vs prior  Musculoskeletal: Full range of motion in all 4 extremities. No joint swelling, Left shoulder  no pain with ROM        Assessment/Plan: 1. Functional deficits which require 3+ hours per day of interdisciplinary therapy in a comprehensive inpatient rehab setting. Physiatrist is providing close team supervision and 24 hour management of active  medical problems listed below. Physiatrist and rehab team continue to assess barriers to discharge/monitor patient progress toward functional and medical goals  Care Tool:  Bathing    Body parts bathed by patient: Left arm, Chest, Abdomen, Front perineal area, Buttocks, Right upper leg, Left upper leg, Right lower leg, Left lower leg, Face   Body parts bathed by helper: Right arm     Bathing assist Assist Level: Minimal Assistance - Patient > 75%     Upper Body Dressing/Undressing Upper body dressing   What is the patient wearing?: Pull over shirt    Upper body assist Assist Level: Minimal Assistance - Patient > 75%    Lower Body Dressing/Undressing Lower body dressing      What is the patient wearing?: Pants, Underwear/pull up     Lower body assist Assist for lower body dressing: Moderate Assistance - Patient 50 - 74%     Toileting Toileting    Toileting assist Assist for toileting: Maximal Assistance - Patient 25 - 49% Assistive Device Comment: bedpan   Transfers Chair/bed transfer  Transfers assist  Chair/bed transfer activity did not occur: Safety/medical concerns  Chair/bed transfer assist level: Minimal Assistance - Patient >  75% (squat pivot to R side)     Locomotion Ambulation   Ambulation assist   Ambulation activity did not occur: Safety/medical concerns  Assist level: Moderate Assistance - Patient 50 - 74% Assistive device: Walker-rolling Max distance: 62f   Walk 10 feet activity   Assist  Walk 10 feet activity did not occur: Safety/medical concerns  Assist level: Moderate Assistance - Patient - 50 - 74% Assistive device: Walker-rolling   Walk 50 feet activity   Assist Walk 50 feet with 2 turns activity did not occur: Safety/medical concerns  Assist level: Moderate Assistance - Patient - 50 - 74% Assistive device: Walker-rolling    Walk 150 feet activity   Assist Walk 150 feet activity did not occur: Safety/medical  concerns         Walk 10 feet on uneven surface  activity   Assist Walk 10 feet on uneven surfaces activity did not occur: Safety/medical concerns         Wheelchair     Assist Is the patient using a wheelchair?: Yes Type of Wheelchair: Manual    Wheelchair assist level: Dependent - Patient 0%      Wheelchair 50 feet with 2 turns activity    Assist        Assist Level: Dependent - Patient 0%   Wheelchair 150 feet activity     Assist      Assist Level: Dependent - Patient 0%   Blood pressure 108/65, pulse (!) 59, temperature 97.9 F (36.6 C), temperature source Oral, resp. rate 14, height '5\' 10"'$  (1.778 m), weight 79.3 kg, SpO2 97 %.  Assessment and plan: 1. 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  Antithrombotics: -DVT/anticoagulation:  Pharmaceutical: Lovenox             -antiplatelet therapy: Aspirin 81 mg daily and Plavix 75 mg day x3 weeks (end 9/22) then aspirin alone 3. Pain Management: Tylenol as needed 4. Mood/Behavior/Sleep: Provide emotional support             -antipsychotic agents: N/A             - 9/17 - inc trazodone form 50 to 75 mg qhs  5. Neuropsych/cognition: This patient is capable of making decisions on his own behalf. 6. Skin/Wound Care: Routine skin checks 7. Fluids/Electrolytes/Nutrition: Routine in and outs with follow-up chemistries Discussed with patient importance of fluid intake to prevent dehydration, Will ask nursing to encourage fluids 8.  Permissive hypertension.  Decrease Losartan to '25mg'$ , continue this dose             9/15- BP controlled- cont' regimen             9/17 - BP well controlled    08/09/2022    5:07 AM 08/08/2022    7:30 PM 08/08/2022    2:03 PM  Vitals with BMI  Systolic [id number] Diastolic 65 73 87  Pulse 59 70 89       9.  Hyperlipidemia.  continue Crestor  10.  COnstipation add sennaS 2 po BID              -Order sorbitol  9/15- LBM 9/13- if no BM by tomorrow- will need sorbitol again.              9/16 - x2 BM   11.  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

## 2022-08-09 NOTE — Progress Notes (Signed)
Occupational Therapy Session Note  Patient Details  Name: Daniel [name] MRN: [id number] Date of Birth: 05/02/1946  Today's Date: 08/09/2022 OT Individual Time: 1027-2536 OT Individual Time Calculation (min): 91 min    Short Term Goals: Week 2:  OT Short Term Goal 1 (Week 2): pt will complete UB dressing from EOB with CGA and CGA for dynamic sitting balance OT Short Term Goal 2 (Week 2): pt will complete 1 part of LB dressing with MIN A OT Short Term Goal 3 (Week 2): pt will incorporate LUE into ADLs with < 3 cues during functional mobility and transfers  Skilled Therapeutic Interventions/Progress Updates:  Pt greeted supine in bed, pt  agreeable to OT intervention. Session focus on BADL reeducation, functional mobility, dynamic standing balance, LUE NMR and decreasing overall caregiver burden.pt completed supine>sit with MIN A to L side. Tp completed UB dressing from EOB with CGA with MIN verbal cues for sequencing and hemi strategies. Pt donned pants from EOB with MIN A needing assist to thread LLE but able to stand with MIN A and pull pants up to waist line.  Pt completed squat pivot transfers throughout session with MINA.  Total A transport to gym where pt completed various NMR tasks to increase LUE ROM. Pt instructed to roll small weighted ball R<>L and forward/backwards on side table with LUE to facilitate NMR to LUE and work on horizontal shoulder ABD/ADD and scapular protraction/retraction. Of note pt was able to transition supinated wrist to pronated while sitting EOM with L hand on side table! Pt transitioned into sidelying where UE rangers with utilized to work on shoulder flexion/extension from gravity eliminated position and as well as scpaular protraction/retraction with L wrist supported and MIN A provided. Pt was also able to place LUE on OTA shoulder with pt able to push into therapists shoulder with 2-/5. Additionally worked on dynamic standing balance and midline orientation  with pt standing from EOM with Rw with [place] and instructed to remove squigz from mirror + return to midline. Pt required up to MIN A for balance with MOD cues need to try to maintain L quad activation.  Ended session worked on blocked squat pivot transfer from EOM<>w/c to R and L side with pt having most difficulty shifting weight anteriorly during transfer but abel to complete with MINA.   Pt left up in w/c with all needs within reach.                  Therapy Documentation Precautions:  Precautions Precautions: Fall, Other (comment) Precaution Comments: L hemiplegia, poor postural control Restrictions Weight Bearing Restrictions: No   Pain: No pain reported during session    Therapy/Group: Individual Therapy  Corinne Ports [facility] 08/09/2022, 12:24 PM

## 2022-08-09 NOTE — Plan of Care (Signed)
  Problem: RH Ambulation Goal: LTG Patient will ambulate in home environment (PT) Description: LTG: Patient will ambulate in home environment, # of feet with assistance (PT). Outcome: Not Met (add Reason) Flowsheets (Taken 08/09/2022 1919) LTG: Pt will ambulate in home environ  assist needed:: (Pt will not be a safe functional ambulator with family in the home by time of DC.) -- Note: Pt will not be a safe functional ambulator with family in the home by time of DC.    Problem: RH Bed Mobility Goal: LTG Patient will perform bed mobility with assist (PT) Description: LTG: Patient will perform bed mobility with assistance, with/without cues (PT). Flowsheets (Taken 08/09/2022 1919) LTG: Pt will perform bed mobility with assistance level of: (downgraded based on pt progress) Minimal Assistance - Patient > 75% Note: downgraded based on pt progress   Problem: RH Bed to Chair Transfers Goal: LTG Patient will perform bed/chair transfers w/assist (PT) Description: LTG: Patient will perform bed to chair transfers with assistance (PT). Flowsheets (Taken 08/09/2022 1919) LTG: Pt will perform Bed to Chair Transfers with assistance level: (downgraded based on pt progress) Minimal Assistance - Patient > 75% Note: downgraded based on pt progress   Problem: RH Car Transfers Goal: LTG Patient will perform car transfers with assist (PT) Description: LTG: Patient will perform car transfers with assistance (PT). Flowsheets (Taken 08/09/2022 1919) LTG: Pt will perform car transfers with assist:: (downgraded based on pt progress) Minimal Assistance - Patient > 75% Note: downgraded based on pt progress   Problem: RH Ambulation Goal: LTG Patient will ambulate in controlled environment (PT) Description: LTG: Patient will ambulate in a controlled environment, # of feet with assistance (PT). Flowsheets (Taken 08/09/2022 1919) LTG: Pt will ambulate in controlled environ  assist needed:: (downgraded based on pt  progress) Moderate Assistance - Patient 50 - 74% LTG: Ambulation distance in controlled environment: 35f using LRAD with therapist Note: downgraded based on pt progress    Problem: RH Wheelchair Mobility Goal: LTG Patient will propel w/c in home environment (PT) Description: LTG: Patient will propel wheelchair in home environment, # of feet with assistance (PT). Flowsheets (Taken 08/09/2022 1924) LTG: Pt will propel w/c in home environ  assist needed:: (Goal added to allow pt increased independence with functional mobility.) Supervision/Verbal cueing LTG: Propel w/c distance in home environment: [id number]: Goal added to allow pt increased independence with functional mobility.    Problem: RH Wheelchair Mobility Goal: LTG Patient will propel w/c in controlled environment (PT) Description: LTG: Patient will propel wheelchair in controlled environment, # of feet with assist (PT) Flowsheets (Taken 08/09/2022 1924) LTG: Pt will propel w/c in controlled environ  assist needed:: (Goal added to allow pt increased independence with functional mobility.) Supervision/Verbal cueing LTG: Propel w/c distance in controlled environment: [id number]: Goal added to allow pt increased independence with functional mobility.

## 2022-08-10 NOTE — Progress Notes (Signed)
Physical Therapy Session Note  Patient Details  Name: Daniel [name] MRN: [id number] Date of Birth: Apr 15, 1946  Today's Date: 08/10/2022 PT Individual Time: 1302-1400 PT Individual Time Calculation (min): 58 min   Short Term Goals: Week 2:  PT Short Term Goals - Week 2 PT Short Term Goal 1 (Week 2): Pt will perform supine<>sit with min assist consistently PT Short Term Goal 2 (Week 2): Pt will perform sit<> stand with min assist consistently PT Short Term Goal 2 - Progress (Week 2): Met PT Short Term Goal 3 (Week 2): Pt will perform squat pivot transfer with min assist consistently PT Short Term Goal 3 - Progress (Week 2): Met PT Short Term Goal 4 (Week 2): Pt will ambulate +22f with mod assist using LRAD (+2 WC as needed for safety). PT Short Term Goal 4 - Progress (Week 2): Progressing toward goal PT Short Term Goal 5 (Week 2): Pt will initiate stair training. PT Short Term Goal 5 - Progress (Week 2): Met PT Short Term Goals - Week 3 PT Short Term Goal 1 (Week 3): = to LTGs based on ELOS  Skilled Therapeutic Interventions/Progress Updates:  Patient seated upright in w/c on entrance to room. Patient alert and agreeable to PT session.   Patient with no pain complaint at start of session.  Therapeutic Activity: Transfers: Pt performed sit<>stand and stand pivot transfers throughout session with Min/ modA for posterior bias initially and improving to CGA throughout session. MinA provided for standing balance. Provided verbal cues for forward scoot, forward lean, and reaching back to w/c armrest prior to descent to sit.   Gait Training:  Pt ambulated 40 ft x3 using R side HR with MinA for balance and vc/ tc to LLE for increased hip/ knee flexion to improve toe clearance. CPO assessment of Sprystep Plus and Max wit determination of use of Max with soft heel lift added to shoe under AFO. Mild improvement noted in uncontrolled hyperextension. CPO and PT in agreement that pt will improve  quad control with time and continued therapies. CPO also recommending toe cap to L shoe and takes shoe on pt's agreement and to bring back corrected shoe this afternoon. Pt happy and relates - "you can take it as long as you fix it!"  Wheelchair Mobility:  Pt propelled wheelchair 150 feet around two corners to R and L with supervision following education re: hemi-technique. Pt then challenged with steering with slalom of 6 cones placed 5 feet apart in hallway. Pt educated on pivot point of chair with first pass through of cones. Pt then improves over 3 more passes of course with fewer touches of cones. Good clearance on R with increased cues to clear on L. Cones then adjusted to 4 feet apart with pt able to perform 2 passes with increased touches/ move of cones especially to L side but improvement noted from first pass-through to second.   Patient seated upright in w/c at end of session with brakes locked, belt alarm set, and all needs within reach. Pt oriented to time and time of next session with OT.   Therapy Documentation Precautions:  Precautions Precautions: Fall, Other (comment) Precaution Comments: L hemiplegia, poor postural control Restrictions Weight Bearing Restrictions: No General:   Vital Signs:  Pain: No complaint of pain this session.   Therapy/Group: Individual Therapy  JAlger SimonsPT, DPT, CSRS 08/10/2022, 6:57 PM

## 2022-08-10 NOTE — Progress Notes (Signed)
Occupational Therapy Session Note  Patient Details  Name: Daniel [name] MRN: [id number] Date of Birth: 07/02/1946  Today's Date: 08/10/2022 OT Individual Time: 0920-1030 & 1424 - 1513 OT Individual Time Calculation (min): 70 min & 49 min    Short Term Goals: Week 3:  OT Short Term Goal 1 (Week 3): LTG= STG  First Session (1448 - 1856) - Skilled Therapeutic Interventions/Progress Updates:  Pt awake in bed upon OT arrival to the room. Pt reports, "My arm was hurting this morning." Pt in agreement for OT session.  Therapy Documentation Precautions:  Precautions Precautions: Fall, Other (comment) Precaution Comments: L hemiplegia, poor postural control Restrictions Weight Bearing Restrictions: No Vital Signs: Please see "Flowsheet" for most recent vitals charted by nursing staff.  Pain: Pain Assessment Pain Scale: 0-10 Pain Score: 0-No pain  ADL: Upper Body Bathing: Supervision/safety (Pt able to bathe all UB parts using hemi-techniques and LH sponge to bathe RUE while seated on BSC in the shower.) Where Assessed-Upper Body Bathing: Shower Lower Body Bathing: Contact guard (Pt able to bathe all LB parts while seated on BSC using LH sponge to bathe B feet and using opening in BSC with anterior access to bathe peri-areas and buttocks with CGA to ensure sitting balance.) Where Assessed-Lower Body Bathing: Shower Upper Body Dressing: Supervision/safety (Pt able to doff and don a pull-over style shirt while seated in w/c with good recall of hemi-techniques with supervision for safety with sitting balance.) Where Assessed-Upper Body Dressing: Wheelchair Lower Body Dressing: Maximal assistance (Pt able to don pants while seated in w/c with assistance to fully thread BLE and pull pants up while standing with FWW for safety while minimal assist to ensure standing balance due to posterior leaning.) Where Assessed-Lower Body Dressing: Wheelchair Toileting:  (Pt declines need at this  time.) Social research officer, government: Moderate assistance, Minimal cueing (Pt able to perform SPT from w/c <> BSC in the shower with moderate assistance for SPT to the L and minimal assist for SPT to the R back to w/c with use of grab bars.) Social research officer, government Method: Stand pivot Youth worker: Grab bars (BSC in the shower) ADL Comments: Pt able to perform supine > sit at EOB transfer with minimal assistance to fully scoot hips to EOB. Pt able to perform SPT to the R from EOB > w/c with minimal assistance and blocking on LLE. Pt then able to perform bathing with CGA and UB dressing with supervision. Pt requires moderate - minimal assistance for SPT from w/c <> BSC in the shower with increased assist needed when pivoting to L side. Pt requires maximal assistance for LB dressing. Education provided to pt on need for family training and importance of pt managing LUE during ADLs to improve neuro-motor control.  Pt requested to stay in the w/c at end of session. Pt left sitting comfortably in the w/c with personal belongings and call light within reach, belt alarm placed and activated, and comfort needs attended to.   --------------------------------------------------------------------------------------------------------------------------------------------------------------------------------------------------------------------------  Second Session (1425 - 1513) - Skilled Therapeutic Interventions/Progress Updates:  Pt awake up in w/c upon OT arrival to the room. Pt reports, "That's the most I have moved this (L) arm since I have been here.," after using Saebo MAS. Pt in agreement for OT session.  Therapy Documentation Pain:  08/10/22 1424  Pain Assessment  Pain Scale 0-10  Pain Score 0    LUE Neuro Re-Education: Pt participates LUE neuro re-education in order to improve motor control on LUE which  is needed for increased independence with ADLs/mobility. Pt tolerates positioning LUE in  SaeboMAS. OT provides AAROM using Saebo MAS to ensure pt's understanding of movements that are going to be completed. Mirror placed in front of pt for visual biofeedback to improve neuro re-education principles. Pt able to perform 15 reps x 2 of the following movements in a gravity eliminated position while seated in the w/c: shoulder horizontal abd/ad and elbow flex ext. Pt able to perform full AROM in gravity eliminated position for shoulder abd/add, however, on 1st trial pt demo's ~50% AROM of elbow flex/ext in gravity eliminated position. Therefore, vibration was incorporated for increased proprioception to the muscle belly with verbal prompts to perform proper muscle activation with pt able to achieve full AROM with elbow flex/ext in gravity eliminated position using the Saebo. Pt responded well to vibration and reported no discomfort.   W/C Management: Pt noted to have LUE positioned on pillow while seated in w/c. OT retrieved a padded L arm tray for pt to improve proper positioning and placement of LUE while seated up in w/c. Pt reported increased comfort.   Functional Mobility & ADL's: Pt requested to return to bed at ned of session. Pt able to perform SPT to the R from w/c > EOB with minimal assistance. Pt requires moderate assistance to perform sit > supine with L entry to bed d/t requiring assistance to load BLE into bed. Pt requires moderate assistance to doff pants at bed level and maximal assistance to change brief at pt request (brief not soiled, just became wrinkled). OT provided assist to position LUE in safe and proper position in bed.    Pt returned to bed at end of session. Pt left resting comfortably in bed with personal belongings and call light within reach, bed alarm on and activated, bed in low position, 3 bed rails up, and comfort needs attended to.   Therapy/Group: Individual Therapy  Barbee Shropshire 08/10/2022, 4:54 PM

## 2022-08-10 NOTE — Progress Notes (Signed)
PROGRESS NOTE   Subjective/Complaints:  No issues overnite, pt states AFO trial with PT did not fit well.  Has orthotist eval today   ROS-no shoulder pain, has fear of choking but no actual episode, no breathing problems , LBM 9/21     08/10/2022    4:05 AM 08/09/2022    7:27 PM 08/09/2022    2:36 PM  Vitals with BMI  Systolic [phone]  Diastolic 59 70 71  Pulse 51 66 57       Objective:   No results found. No results for input(s): "WBC", "HGB", "HCT", "PLT" in the last 72 hours. No results for input(s): "NA", "K", "CL", "CO2", "GLUCOSE", "BUN", "CREATININE", "CALCIUM" in the last 72 hours.  Intake/Output Summary (Last 24 hours) at 08/10/2022 0737 Last data filed at 08/10/2022 0547 Gross per 24 hour  Intake 440 ml  Output 975 ml  Net -535 ml         Physical Exam: Vital Signs reviewed as above.   General: No acute distress Mood and affect are appropriate Heart: Regular rate and rhythm no rubs murmurs or extra sounds Lungs: Clear to auscultation, breathing unlabored, no rales or wheezes Abdomen: Positive bowel sounds, soft nontender to palpation, nondistended Extremities: No clubbing, cyanosis, or edema   Neurologic: Cranial nerves II through XII intact, motor strength is 5/5 in RIght deltoid, bicep, tricep, grip, hip flexor, knee extensors, ankle dorsiflexor and plantar flexor 2- triceps o/w 0/5 LUE, 3- HF and KE , 3- hamstrings on Left 2/5 toe flexion Trace LUE adduction  Sensory exam normal sensation to light touch and proprioception in bilateral upper and lower extremities Tone- MAS 2 in Left pec-mildly decreased vs prior  Musculoskeletal: Full range of motion in all 4 extremities. No joint swelling, Left shoulder  no pain with ROM        Assessment/Plan: 1. Functional deficits which require 3+ hours per day of interdisciplinary therapy in a comprehensive inpatient rehab setting. Physiatrist is  providing close team supervision and 24 hour management of active medical problems listed below. Physiatrist and rehab team continue to assess barriers to discharge/monitor patient progress toward functional and medical goals  Care Tool:  Bathing    Body parts bathed by patient: Left arm, Chest, Abdomen, Front perineal area, Buttocks, Right upper leg, Left upper leg, Right lower leg, Left lower leg, Face   Body parts bathed by helper: Right arm     Bathing assist Assist Level: Minimal Assistance - Patient > 75%     Upper Body Dressing/Undressing Upper body dressing   What is the patient wearing?: Pull over shirt    Upper body assist Assist Level: Contact Guard/Touching assist    Lower Body Dressing/Undressing Lower body dressing      What is the patient wearing?: Pants     Lower body assist Assist for lower body dressing: Minimal Assistance - Patient > 75%     Toileting Toileting    Toileting assist Assist for toileting: Maximal Assistance - Patient 25 - 49% Assistive Device Comment: bedpan   Transfers Chair/bed transfer  Transfers assist  Chair/bed transfer activity did not occur: Safety/medical concerns  Chair/bed transfer assist level:  Minimal Assistance - Patient > 75% (squat pivot to R side)     Locomotion Ambulation   Ambulation assist   Ambulation activity did not occur: Safety/medical concerns  Assist level: Moderate Assistance - Patient 50 - 74% Assistive device: Walker-rolling Max distance: 76f   Walk 10 feet activity   Assist  Walk 10 feet activity did not occur: Safety/medical concerns  Assist level: Moderate Assistance - Patient - 50 - 74% Assistive device: Walker-rolling   Walk 50 feet activity   Assist Walk 50 feet with 2 turns activity did not occur: Safety/medical concerns  Assist level: Moderate Assistance - Patient - 50 - 74% Assistive device: Walker-rolling    Walk 150 feet activity   Assist Walk 150 feet activity did  not occur: Safety/medical concerns         Walk 10 feet on uneven surface  activity   Assist Walk 10 feet on uneven surfaces activity did not occur: Safety/medical concerns         Wheelchair     Assist Is the patient using a wheelchair?: Yes Type of Wheelchair: Manual    Wheelchair assist level: Dependent - Patient 0%      Wheelchair 50 feet with 2 turns activity    Assist        Assist Level: Dependent - Patient 0%   Wheelchair 150 feet activity     Assist      Assist Level: Dependent - Patient 0%   Blood pressure (!) 109/59, pulse (!) 51, temperature 97.7 F (36.5 C), temperature source Oral, resp. rate 14, height '5\' 10"'$  (1.778 m), weight 79.3 kg, SpO2 97 %.  Assessment and plan: 1. 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  Antithrombotics: -DVT/anticoagulation:  Pharmaceutical: Lovenox             -antiplatelet therapy: Aspirin 81 mg daily and Plavix 75 mg day x3 weeks (end 9/22) then aspirin alone 3. Pain Management: Tylenol as needed 4. Mood/Behavior/Sleep: Provide emotional support             -antipsychotic agents: N/A             - 9/17 - inc trazodone form 50 to 75 mg qhs  5. Neuropsych/cognition: This patient is capable of making decisions on his own behalf. 6. Skin/Wound Care: Routine skin checks 7. Fluids/Electrolytes/Nutrition: Routine in and outs with follow-up chemistries Discussed with patient importance of fluid intake to prevent dehydration, Will ask nursing to encourage fluids, recheck BMET 9/25 8.  Permissive hypertension.  Decrease Losartan to '25mg'$ , continue this dose             9/15- BP controlled- cont' regimen             9/17 - BP well controlled    08/10/2022    4:05 AM 08/09/2022    7:27 PM 08/09/2022    2:36 PM  Vitals with BMI  Systolic [id number] Diastolic 59 70 71  Pulse 51 66 57      9.  Hyperlipidemia.  continue Crestor  10.  COnstipation add sennaS 2 po BID              -  Order sorbitol             9/15- LBM 9/13- if no BM by tomorrow- will need sorbitol again.              9/16 - x2 BM   11.  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

## 2022-08-10 NOTE — Progress Notes (Signed)
Physical Therapy Session Note  Patient Details  Name: Daniel [name] MRN: [id number] Date of Birth: [birth year]  Today's Date: 08/10/2022 PT Individual Time: 1030-1058 PT Individual Time Calculation (min): 28 min   Short Term Goals: Week 3:  PT Short Term Goal 1 (Week 3): = to LTGs based on ELOS  Skilled Therapeutic Interventions/Progress Updates: Pt presented in w/c with hand off from OT agreeable to therapy. Pt denies pain during session. Pt transported to ortho gym for time management. Performed stand pivot transfer with RW and modA to high/low mat. Pt required cues for scooting to EOC and hand placement. Pt also required facilitation for weight shifting when performing transfer and cues for safe management with RW. At mat performed Sit to stand with RW with minA and toe taps to target with LLE for forced use and coordination. Pt then performed stand pivot back to w/c in same manner as prior and propelled via hemi-technique back to room with supervision and increased time. Pt left in w/c at end of session with belt alarm on, call bell within reach and needs met.      Therapy Documentation Precautions:  Precautions Precautions: Fall, Other (comment) Precaution Comments: L hemiplegia, poor postural control Restrictions Weight Bearing Restrictions: No General:   Vital Signs:  Pain: Pain Assessment Pain Scale: 0-10 Pain Score: 0-No pain Mobility:   Locomotion :    Trunk/Postural Assessment :    Balance:   Exercises:   Other Treatments:      Therapy/Group: Individual Therapy  Jackqueline Aquilar 08/10/2022, 1:02 PM

## 2022-08-11 DIAGNOSIS — K5901 Slow transit constipation: Secondary | ICD-10-CM

## 2022-08-11 DIAGNOSIS — R252 Cramp and spasm: Secondary | ICD-10-CM

## 2022-08-11 NOTE — Progress Notes (Signed)
PROGRESS NOTE   Subjective/Complaints:  No issues today. Denies pain. Slept well  ROS: Patient denies fever, rash, sore throat, blurred vision, dizziness, nausea, vomiting, diarrhea, cough, shortness of breath or chest pain, joint or back/neck pain, headache, or mood change.     Objective:   No results found. No results for input(s): "WBC", "HGB", "HCT", "PLT" in the last 72 hours. No results for input(s): "NA", "K", "CL", "CO2", "GLUCOSE", "BUN", "CREATININE", "CALCIUM" in the last 72 hours.  Intake/Output Summary (Last 24 hours) at 08/11/2022 4562 Last data filed at 08/11/2022 0804 Gross per 24 hour  Intake 932 ml  Output 1200 ml  Net -268 ml        Physical Exam: Vital Signs reviewed as above.   Constitutional: No distress . Vital signs reviewed. HEENT: NCAT, EOMI, oral membranes moist Neck: supple Cardiovascular: RRR without murmur. No JVD    Respiratory/Chest: CTA Bilaterally without wheezes or rales. Normal effort    GI/Abdomen: BS +, non-tender, non-distended Ext: no clubbing, cyanosis, or edema Psych: pleasant and cooperative  Neurologic: Cranial nerves II through XII intact, motor strength is 5/5 in RIght deltoid, bicep, tricep, grip, hip flexor, knee extensors, ankle dorsiflexor and plantar flexor 2- triceps o/w 0/5 LUE, 3- HF and KE , 3- hamstrings on Left 2/5 toe flexion Trace LUE adduction  Sensory exam normal sensation to light touch and proprioception in bilateral upper and lower extremities Tone- MAS 1-2 in Left pec   Musculoskeletal: Full range of motion in all 4 extremities. No joint swelling, Left shoulder  no pain with ROM        Assessment/Plan: 1. Functional deficits which require 3+ hours per day of interdisciplinary therapy in a comprehensive inpatient rehab setting. Physiatrist is providing close team supervision and 24 hour management of active medical problems listed below. Physiatrist  and rehab team continue to assess barriers to discharge/monitor patient progress toward functional and medical goals  Care Tool:  Bathing    Body parts bathed by patient: Left arm, Chest, Abdomen, Front perineal area, Buttocks, Right upper leg, Left upper leg, Right lower leg, Left lower leg, Face   Body parts bathed by helper: Right arm     Bathing assist Assist Level: Minimal Assistance - Patient > 75%     Upper Body Dressing/Undressing Upper body dressing   What is the patient wearing?: Pull over shirt    Upper body assist Assist Level: Contact Guard/Touching assist    Lower Body Dressing/Undressing Lower body dressing      What is the patient wearing?: Pants     Lower body assist Assist for lower body dressing: Minimal Assistance - Patient > 75%     Toileting Toileting    Toileting assist Assist for toileting: Maximal Assistance - Patient 25 - 49% Assistive Device Comment: bedpan   Transfers Chair/bed transfer  Transfers assist  Chair/bed transfer activity did not occur: Safety/medical concerns  Chair/bed transfer assist level: Minimal Assistance - Patient > 75% (squat pivot to R side)     Locomotion Ambulation   Ambulation assist   Ambulation activity did not occur: Safety/medical concerns  Assist level: Moderate Assistance - Patient 50 - 74% Assistive device:  Walker-rolling Max distance: 17f   Walk 10 feet activity   Assist  Walk 10 feet activity did not occur: Safety/medical concerns  Assist level: Moderate Assistance - Patient - 50 - 74% Assistive device: Walker-rolling   Walk 50 feet activity   Assist Walk 50 feet with 2 turns activity did not occur: Safety/medical concerns  Assist level: Moderate Assistance - Patient - 50 - 74% Assistive device: Walker-rolling    Walk 150 feet activity   Assist Walk 150 feet activity did not occur: Safety/medical concerns         Walk 10 feet on uneven surface  activity   Assist Walk  10 feet on uneven surfaces activity did not occur: Safety/medical concerns         Wheelchair     Assist Is the patient using a wheelchair?: Yes Type of Wheelchair: Manual    Wheelchair assist level: Dependent - Patient 0%      Wheelchair 50 feet with 2 turns activity    Assist        Assist Level: Dependent - Patient 0%   Wheelchair 150 feet activity     Assist      Assist Level: Dependent - Patient 0%   Blood pressure 113/63, pulse (!) 50, temperature 97.7 F (36.5 C), temperature source Oral, resp. rate 18, height '5\' 10"'$  (1.778 m), weight 79.3 kg, SpO2 97 %.  Assessment and plan: 1. 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  Antithrombotics: -DVT/anticoagulation:  Pharmaceutical: Lovenox             -antiplatelet therapy: Aspirin 81 mg daily and Plavix 75 mg day x3 weeks (end 9/22) then aspirin alone 3. Pain Management: Tylenol as needed 4. Mood/Behavior/Sleep: Provide emotional support             -antipsychotic agents: N/A             - 9/17 - inc trazodone form 50 to 75 mg qhs --sleep improved 5. Neuropsych/cognition: This patient is capable of making decisions on his own behalf. 6. Skin/Wound Care: Routine skin checks 7. Fluids/Electrolytes/Nutrition: Routine in and outs with follow-up chemistries Discussed with patient importance of fluid intake to prevent dehydration, Will ask nursing to encourage fluids, recheck BMET 9/25 8.  Permissive hypertension.  Decrease Losartan to '25mg'$ , continue this dose             9/15- BP controlled- cont' regimen             9/23 bp well controlled    08/11/2022    4:14 AM 08/10/2022    7:23 PM 08/10/2022    2:07 PM  Vitals with BMI  Systolic [id number] Diastolic 63 62 76  Pulse 50 65 75      9.  Hyperlipidemia.   continue Crestor  10.  COnstipation add sennaS 2 po BID              -Order sorbitol             9/15- LBM 9/13- if no BM by tomorrow- will need sorbitol again.  9/21--had BM   11.  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

## 2022-08-11 NOTE — Progress Notes (Signed)
Physical Therapy Session Note  Patient Details  Name: Daniel [name] MRN: [id number] Date of Birth: [birth year]  Today's Date: 08/11/2022 PT Individual Time: 0233-4356 PT Individual Time Calculation (min): 68 min   Short Term Goals: Week 3:  PT Short Term Goal 1 (Week 3): = to LTGs based on ELOS  Skilled Therapeutic Interventions/Progress Updates:     Pt greeted supine in bed and agreeable to therapy. Pt reporting no pain.   Supine to sit with min assist for trunk and L LE management and pt using heavy support of bed railing with right hand. Pt has difficulty remaining upright due to heavy posterior lean and required mod assist once to bring trunk fwd due to posterior LOB. Max assist with donning TED hose, shoes, and L [place] max GRAFO.  Pt able to maintain static and dynamic sitting balance with CGA and cues to lean fwd to avoid falling posteriorly.  Sit <> stand to pull up pants with first trial pt performing too quickly and grabbing hold of bed railing with right hand to steady himself and min/mod assist from SPT for balance due to posterior lean. Education given to slow down ascent into standing for better balance and to not grab for items too quickly. Second trial of sit to stand was better with pt coming to stand slower with CGA/min assist to come to standing.  Mod assist for standing balance when pt is reaching down to pull up pants.   Squat pivot transfer to the left from EOB to [facility] with min/mod assist for trunk management. Pt leaning fwd adequately, but not using enough force through limbs to move from EOB to [facility] without assistance.   Pt transported to and from gyms via [employer] for time management.   X 4 trials of car transfers with stand pivot technique from [facility] to car seat. Mod assist given for balance with cues on sequencing, where to reach with right hand and directions for stepping with left foot. Pt continuing to grab for object when coming to stand before balance is steady due to  posterior lean to which pt is directed to sit back down, educated on corrections, and try again. Pt has better form with second trials.   WC mobility on ramp x 2 ascending and descending with min/mod assist. Mod assist to push WC given at beginning of ramp where there is small threshold and minimal assist given to ascend and descend rest of the ramp. Pt is CGA with turning at top of the ramp. Cues given for right hand and leg use to propel WC.   Gait training/NMR:  20 ft amb with quad cane in right hand and mod assist. Demonstration of step to pattern performed by SPT prior to pt amb. Mod assist given for balance, L LE swing through and L foot placement, and right weight shifting. Pt required heavy cues for sequencing and correct cane and right foot placement. Pt continues to demo decreased postural awareness and proprioception evident by posterior lean and insufficient right weight shift to increase ease with L LE swing through.    Pt left sitting up in [facility] with posey belt on, call bell in reach, dinner set up, and all needs met.   Therapy Documentation Precautions:  Precautions Precautions: Fall, Other (comment) Precaution Comments: L hemiplegia, poor postural control Restrictions Weight Bearing Restrictions: No  Therapy/Group: Individual Therapy  Kayleen Memos 08/11/2022, 4:13 PM

## 2022-08-12 NOTE — Progress Notes (Signed)
Physical Therapy Session Note  Patient Details  Name: Daniel [name] MRN: [id number] Date of Birth: [birth year]  Today's Date: 08/12/2022 PT Individual Time: 0930-1045 PT Individual Time Calculation (min): 75 min   Short Term Goals: Week 3:  PT Short Term Goal 1 (Week 3): = to LTGs based on ELOS  Skilled Therapeutic Interventions/Progress Updates:    Pt received seated in bed, agreeable to PT session. No complaints of pain this date. Assisted pt with donning knee-high TEDs and pants at bed level. Supine to sit with min A needed for some trunk elevation. Assisted pt with donning shoes and L GRAFO while seated EOB. Sit to stand and stand pivot transfers with min A throughout session, cues needed for upright trunk posture, to increase WB on LLE, and to take bigger step with LLE when transferring. Dependent transport via w/c to/from therapy gym for time and energy conservation. Sit to stand from elevated mat table in gym x 10 reps with min A, use of mirror for visual feedback for midline. Pt exhibits decreased WB on LLE with sit to stand, increased weight shift to the L with stand to sit. With use of mirror, manual cueing, and verbal feedback pt exhibits improved ability to maintain midline with transfer, does better with sit to stand vs stand to sit. Seated reaching outside BOS and across midline with RUE for targets with manual assist for WB through LLE and LUE. Pt with frequent LOB when leaning outside BOS to the L, able to recover with min A. Blocked practice of stand pivot transfers w/c to/from mat table with CGA to min A, focus as noted above with transfers. Assisted pt with changing his t-shirt once back in his room, min to mod A needed. Pt left seated in w/c in room with needs in reach, quick release belt and chair alarm in place.  Therapy Documentation Precautions:  Precautions Precautions: Fall, Other (comment) Precaution Comments: L hemiplegia, poor postural control Restrictions Weight  Bearing Restrictions: No      Therapy/Group: Individual Therapy   Excell Seltzer, PT, DPT, CSRS 08/12/2022, 12:11 PM

## 2022-08-12 NOTE — Discharge Summary (Signed)
Physician Discharge Summary  Patient ID: Daniel [name] MRN: [id number] DOB/AGE: 03/22/1946 76 y.o.  Admit date: 07/24/2022 Discharge date: 08/15/2022  Discharge Diagnoses:  Principal Problem:   CVA (cerebral vascular accident) [facility]) Active Problems:   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 (HCC) DVT prophylaxis Permissive hypertension Hyperlipidemia Constipation Left shoulder pain related to spasticity Tobacco use  Discharged Condition: Stable  Significant Diagnostic Studies: DG Wrist Complete Left  Result Date: 08/13/2022 CLINICAL DATA:  Pain EXAM: LEFT WRIST - COMPLETE 3+ VIEW COMPARISON:  None Available. FINDINGS: No recent fracture or dislocation is seen. There are subcortical cysts in distal ulna, lunate and scaphoid, possibly due to degenerative changes. Degenerative changes are noted in first carpometacarpal joint with joint space narrowing and bony spurs. Subcortical cysts are seen in the head and base of first metacarpal, possibly due to degenerative changes. Small bony spurs seen in first metacarpophalangeal joint. IMPRESSION: No recent fracture or dislocation is seen in left wrist. Degenerative changes in multiple joints as described in the body of the report. Electronically Signed   By: Elmer Picker M.D.   On: 08/13/2022 13:37   DG CHEST PORT 1 VIEW  Result Date: 07/24/2022 CLINICAL DATA:  Shortness of breath, dysphagia EXAM: PORTABLE CHEST 1 VIEW COMPARISON:  12/14/2006 FINDINGS: The heart size and mediastinal contours are within normal limits. Both lungs are clear. The visualized skeletal structures are unremarkable. IMPRESSION: No acute abnormality of the lungs in AP portable projection. Electronically Signed   By: Delanna Ahmadi M.D.   On: 07/24/2022 13:33   MR BRAIN WO CONTRAST  Result Date: 07/23/2022 CLINICAL DATA:  Neuro deficit, acute, stroke suspected. EXAM: MRI HEAD WITHOUT CONTRAST TECHNIQUE: Multiplanar, multiecho pulse sequences of the brain  and surrounding structures were obtained without intravenous contrast. COMPARISON:  Head CT 07/22/2022 and MRI 07/19/2022 FINDINGS: Brain: There is an acute infarct involving the posterior right corona radiata and extending into the posterior aspect of the right lentiform nucleus which has substantially enlarged from the prior MRI (now 3 cm in AP dimension). There is no associated hemorrhage. T2 hyperintensities elsewhere in the cerebral white matter bilaterally are unchanged and nonspecific but compatible with mild chronic small vessel ischemic disease. Chronic lacunar infarcts are again noted in the left parietal white matter and right thalamus. No mass, midline shift, or extra-axial fluid collection is identified. Mild cerebral atrophy is within normal limits for age. A few scattered chronic cerebral microhemorrhages are again seen. Vascular: Absent flow void in the distal right vertebral artery which reflects a change from the prior study, with the vessel appearing small but patent on the prior MRA. Other major intracranial vascular flow voids are preserved. Skull and upper cervical spine: Unremarkable bone marrow signal. Sinuses/Orbits: Bilateral cataract extraction. Mild mucosal thickening in the paranasal sinuses. Clear mastoid air cells. Other: None. IMPRESSION: 1. Increased size of acute infarct involving the right corona radiata and basal ganglia. 2. 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 3. Newly abnormal appearance of the nondominant distal right vertebral artery which may reflect slow flow or occlusion. Electronically Signed   By: Logan Bores M.D.   On: 07/23/2022 14:51   CT Head Wo Contrast  Result Date: 07/22/2022 CLINICAL DATA:  Follow-up examination for stroke. EXAM: CT HEAD WITHOUT CONTRAST TECHNIQUE: Contiguous axial images were obtained from the base of the skull through the vertex without intravenous contrast. RADIATION DOSE REDUCTION: This exam was  performed according to the departmental dose-optimization program which includes automated  exposure control, adjustment of the mA and/or kV according to patient size and/or use of iterative reconstruction technique. COMPARISON:  Prior MRI from 07/19/2022. FINDINGS: Brain: Age-related cerebral atrophy with chronic small vessel ischemic disease. Remote lacunar infarct noted at the right thalamus. Continued normal interval evolutionary changes noted about the previously identified infarct involving the right frontal corona radiata/centrum semi ovale, relatively similar in size as compared to prior MRI. No regional mass effect or associated hemorrhage. No other visible acute large vessel territory infarct. No intracranial hemorrhage. No mass lesion or midline shift. No hydrocephalus or extra-axial fluid collection. Vascular: No hyperdense vessel. Calcified atherosclerosis present at skull base. Skull: Scalp soft tissues and calvarium demonstrate no acute finding. Sinuses/Orbits: Globes normal soft tissues within normal limits. Scattered mucoperiosteal thickening present about the sphenoethmoidal and maxillary sinuses. Mastoid air cells remain clear. Other: None. IMPRESSION: 1. Continued normal interval evolutionary changes about the previously identified infarct involving the right frontal corona radiata/centrum semi ovale, relatively similar in size as compared to prior MRI. No regional mass effect or associated hemorrhage. 2. No other new acute intracranial abnormality. 3. Age-related cerebral atrophy with chronic small vessel ischemic disease. Electronically Signed   By: Jeannine Boga M.D.   On: 07/22/2022 23:17   VAS US CAROTID  Result Date: 07/20/2022 Carotid Arterial Duplex Study Patient Name:  Daniel [name]  Date of Exam:   07/20/2022 Medical Rec #: [id number]        Accession #:    [id number] Date of Birth: 08-Feb-1946         Patient Gender: M Patient Age:   76 years Exam Location:  [facility]  Procedure:      VAS US CAROTID Referring Phys: Janine Ores --------------------------------------------------------------------------------  Indications:  CVA and Other cardiovascular symptoms. Risk Factors: Hypertension, past history of smoking, prior CVA. Performing Technologist: Bobetta Lime BS, RVT  Examination Guidelines: A complete evaluation includes B-mode imaging, spectral Doppler, color Doppler, and power Doppler as needed of all accessible portions of each vessel. Bilateral testing is considered an integral part of a complete examination. Limited examinations for reoccurring indications may be performed as noted.  Right Carotid Findings: +----------+--------+--------+--------+-------------------------+--------+           PSV cm/sEDV cm/sStenosisPlaque Description       Comments +----------+--------+--------+--------+-------------------------+--------+ CCA Prox  56      17                                                +----------+--------+--------+--------+-------------------------+--------+ CCA Distal58      14                                                +----------+--------+--------+--------+-------------------------+--------+ ICA Prox  65      22              heterogenous and calcific         +----------+--------+--------+--------+-------------------------+--------+ ICA Distal51      15                                                +----------+--------+--------+--------+-------------------------+--------+ ECA  69      11              heterogenous and calcific         +----------+--------+--------+--------+-------------------------+--------+ +----------+--------+-------+----------------+-------------------+           PSV cm/sEDV cmsDescribe        Arm Pressure (mmHG) +----------+--------+-------+----------------+-------------------+ [id number]            Multiphasic, WNL                     +----------+--------+-------+----------------+-------------------+ +---------+--------+--+--------+-+---------+ VertebralPSV cm/s45EDV cm/s7Antegrade +---------+--------+--+--------+-+---------+  Left Carotid Findings: +----------+--------+--------+--------+-------------------------+--------+           PSV cm/sEDV cm/sStenosisPlaque Description       Comments +----------+--------+--------+--------+-------------------------+--------+ CCA Prox  78      11                                                +----------+--------+--------+--------+-------------------------+--------+ CCA Distal58      17                                                +----------+--------+--------+--------+-------------------------+--------+ ICA Prox  50      18              heterogenous and calcific         +----------+--------+--------+--------+-------------------------+--------+ ICA Distal64      27                                                +----------+--------+--------+--------+-------------------------+--------+ ECA       76      14              heterogenous                      +----------+--------+--------+--------+-------------------------+--------+ +----------+--------+--------+----------------+-------------------+           PSV cm/sEDV cm/sDescribe        Arm Pressure (mmHG) +----------+--------+--------+----------------+-------------------+ [id number]             Multiphasic, WNL                    +----------+--------+--------+----------------+-------------------+ +---------+--------+--+--------+--+---------+ VertebralPSV cm/s45EDV cm/s11Antegrade +---------+--------+--+--------+--+---------+   Summary: Right Carotid: Velocities in the right ICA are consistent with a 1-39% stenosis. Left Carotid: Velocities in the left ICA are consistent with a 1-39% stenosis. Vertebrals:  Bilateral vertebral arteries demonstrate antegrade flow. Subclavians: Normal flow  hemodynamics were seen in bilateral subclavian              arteries. *See table(s) above for measurements and observations.  Electronically signed by Antony Contras MD on 07/20/2022 at 2:41:01 PM.    Final    ECHOCARDIOGRAM COMPLETE  Result Date: 07/19/2022    ECHOCARDIOGRAM REPORT   Patient Name:   MY MADARIAGA Date of Exam: 07/19/2022 Medical Rec #:  [id number]       Height:       68.0 in Accession #:    [id number]      Weight:       188.4 lb Date of Birth:  1945/12/28  BSA:          1.992 m Patient Age:    54 years        BP:           165/81 mmHg Patient Gender: M               HR:           46 bpm. Exam Location:  Inpatient Procedure: 2D Echo Indications:    stroke  History:        Patient has no prior history of Echocardiogram examinations.                 Risk Factors:Hypertension.  Sonographer:    Johny Chess RDCS Referring Phys: [place]  1. Left ventricular ejection fraction, by estimation, is 55 to 60%. Left ventricular ejection fraction by PLAX is 58 %. The left ventricle has normal function. The left ventricle has no regional wall motion abnormalities. Left ventricular diastolic parameters are consistent with Grade I diastolic dysfunction (impaired relaxation). Elevated left ventricular end-diastolic pressure.  2. Right ventricular systolic function is normal. The right ventricular size is normal. There is normal pulmonary artery systolic pressure.  3. Left atrial size was mildly dilated.  4. The mitral valve is normal in structure. Trivial mitral valve regurgitation. No evidence of mitral stenosis.  5. The aortic valve is calcified. There is mild calcification of the aortic valve. There is mild thickening of the aortic valve. Aortic valve regurgitation is trivial. No aortic stenosis is present.  6. Aortic dilatation noted. There is mild dilatation of the ascending aorta, measuring 37 mm.  7. The inferior vena cava is normal in size with greater than 50% respiratory  variability, suggesting right atrial pressure of 3 mmHg. FINDINGS  Left Ventricle: Left ventricular ejection fraction, by estimation, is 55 to 60%. Left ventricular ejection fraction by PLAX is 58 %. The left ventricle has normal function. The left ventricle has no regional wall motion abnormalities. The left ventricular internal cavity size was normal in size. There is no left ventricular hypertrophy. Left ventricular diastolic parameters are consistent with Grade I diastolic dysfunction (impaired relaxation). Elevated left ventricular end-diastolic pressure. Right Ventricle: The right ventricular size is normal. No increase in right ventricular wall thickness. Right ventricular systolic function is normal. There is normal pulmonary artery systolic pressure. The tricuspid regurgitant velocity is 1.88 m/s, and  with an assumed right atrial pressure of 3 mmHg, the estimated right ventricular systolic pressure is 79.8 mmHg. Left Atrium: Left atrial size was mildly dilated. Right Atrium: Right atrial size was normal in size. Pericardium: There is no evidence of pericardial effusion. Mitral Valve: The mitral valve is normal in structure. There is mild thickening of the mitral valve leaflet(s). There is mild calcification of the mitral valve leaflet(s). Mild mitral annular calcification. Trivial mitral valve regurgitation. No evidence  of mitral valve stenosis. Tricuspid Valve: The tricuspid valve is normal in structure. Tricuspid valve regurgitation is trivial. No evidence of tricuspid stenosis. Aortic Valve: The aortic valve is calcified. There is mild calcification of the aortic valve. There is mild thickening of the aortic valve. Aortic valve regurgitation is trivial. No aortic stenosis is present. Aortic valve mean gradient measures 5.0 mmHg. Aortic valve peak gradient measures 9.7 mmHg. Aortic valve area, by VTI measures 1.66 cm. Pulmonic Valve: The pulmonic valve was normal in structure. Pulmonic valve  regurgitation is not visualized. No evidence of pulmonic stenosis. Aorta: Aortic dilatation noted. There is mild  dilatation of the ascending aorta, measuring 37 mm. Venous: The inferior vena cava is normal in size with greater than 50% respiratory variability, suggesting right atrial pressure of 3 mmHg. IAS/Shunts: No atrial level shunt detected by color flow Doppler.  LEFT VENTRICLE PLAX 2D LV EF:         Left            Diastology                ventricular     LV e' medial:    5.00 cm/s                ejection        LV E/e' medial:  17.6                fraction by     LV e' lateral:   6.31 cm/s                PLAX is 58      LV E/e' lateral: 14.0                %. LVIDd:         4.30 cm LVIDs:         3.00 cm LV PW:         1.10 cm LV IVS:        0.80 cm LVOT diam:     1.70 cm LV SV:         63 LV SV Index:   32 LVOT Area:     2.27 cm  LV Volumes (MOD) LV vol d, MOD    90.4 ml A4C: LV vol s, MOD    47.3 ml A4C: LV SV MOD A4C:   90.4 ml RIGHT VENTRICLE             IVC RV S prime:     11.10 cm/s  IVC diam: 1.70 cm TAPSE (M-mode): 3.2 cm LEFT ATRIUM             Index        RIGHT ATRIUM           Index LA diam:        3.80 cm 1.91 cm/m   RA Area:     10.20 cm LA Vol (A2C):   39.0 ml 19.57 ml/m  RA Volume:   19.20 ml  9.64 ml/m LA Vol (A4C):   62.8 ml 31.52 ml/m LA Biplane Vol: 52.4 ml 26.30 ml/m  AORTIC VALVE AV Area (Vmax):    1.82 cm AV Area (Vmean):   1.67 cm AV Area (VTI):     1.66 cm AV Vmax:           156.00 cm/s AV Vmean:          105.000 cm/s AV VTI:            0.381 m AV Peak Grad:      9.7 mmHg AV Mean Grad:      5.0 mmHg LVOT Vmax:         125.00 cm/s LVOT Vmean:        77.400 cm/s LVOT VTI:          0.279 m LVOT/AV VTI ratio: 0.73  AORTA Ao Root diam: 3.50 cm Ao Asc diam:  3.70 cm MITRAL VALVE                TRICUSPID VALVE MV Area (PHT): 3.12 cm  TR Peak grad:   14.1 mmHg MV Decel Time: 243 msec     TR Vmax:        188.00 cm/s MV E velocity: 88.10 cm/s MV A velocity: 108.00 cm/s  SHUNTS  MV E/A ratio:  0.82         Systemic VTI:  0.28 m                             Systemic Diam: 1.70 cm Skeet Latch MD Electronically signed by Skeet Latch MD Signature Date/Time: 07/19/2022/7:40:21 PM    Final    MR Brain W and Wo Contrast  Result Date: 07/19/2022 CLINICAL DATA:  Acute neuro deficit. Dizziness and left leg weakness. EXAM: MRI HEAD WITHOUT AND WITH CONTRAST MRA HEAD WITHOUT CONTRAST TECHNIQUE: Multiplanar, multi-echo pulse sequences of the brain and surrounding structures were acquired without and with intravenous contrast. Angiographic images of the Circle of Willis were acquired using MRA technique without intravenous contrast. CONTRAST:  8.64m GADAVIST GADOBUTROL 1 MMOL/ML IV SOLN COMPARISON:  CT head 07/19/2022 FINDINGS: MRI HEAD FINDINGS Brain: Image quality degraded by moderate motion and artifact. Small acute infarct in the right centrum semiovale. No other acute infarct Ventricle size normal. Chronic infarct right thalamus. Chronic microvascular ischemic changes in the white matter. Chronic microhemorrhage right posterior temporal lobe. No mass lesion Normal enhancement postcontrast administration. Image quality degraded by motion. Vascular: Normal arterial flow voids. Skull and upper cervical spine: No focal skeletal lesion. Sinuses/Orbits: Mucosal edema paranasal sinuses. Bilateral cataract extraction Other: None MRA HEAD FINDINGS Anterior circulation: Internal carotid artery patent bilaterally. Anterior and middle cerebral arteries patent. There is decreased signal in the right MCA at the bifurcation likely due to tortuosity. Distal right MCA branches appear patent Posterior circulation: Both vertebral arteries patent to the basilar. Left vertebral artery dominant. Basilar patent. Decreased signal in the posterior cerebral arteries bilaterally likely due to tortuosity. Anatomic variants: None Image quality degraded by moderate motion IMPRESSION: 1. Small acute infarct right  centrum semiovale 2. 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 Motion degraded study of the brain. 3. MRA is limited by vessel tortuosity and motion. No large vessel occlusion identified Electronically Signed   By: CFranchot GalloM.D.   On: 07/19/2022 19:26   MR ANGIO HEAD WO CONTRAST  Result Date: 07/19/2022 CLINICAL DATA:  Acute neuro deficit. Dizziness and left leg weakness. EXAM: MRI HEAD WITHOUT AND WITH CONTRAST MRA HEAD WITHOUT CONTRAST TECHNIQUE: Multiplanar, multi-echo pulse sequences of the brain and surrounding structures were acquired without and with intravenous contrast. Angiographic images of the Circle of Willis were acquired using MRA technique without intravenous contrast. CONTRAST:  8.572mGADAVIST GADOBUTROL 1 MMOL/ML IV SOLN COMPARISON:  CT head 07/19/2022 FINDINGS: MRI HEAD FINDINGS Brain: Image quality degraded by moderate motion and artifact. Small acute infarct in the right centrum semiovale. No other acute infarct Ventricle size normal. Chronic infarct right thalamus. Chronic microvascular ischemic changes in the white matter. Chronic microhemorrhage right posterior temporal lobe. No mass lesion Normal enhancement postcontrast administration. Image quality degraded by motion. Vascular: Normal arterial flow voids. Skull and upper cervical spine: No focal skeletal lesion. Sinuses/Orbits: Mucosal edema paranasal sinuses. Bilateral cataract extraction Other: None MRA HEAD FINDINGS Anterior circulation: Internal carotid artery patent bilaterally. Anterior and middle cerebral arteries patent. There is decreased signal in the right MCA at the bifurcation likely due to tortuosity. Distal right MCA branches appear  patent Posterior circulation: Both vertebral arteries patent to the basilar. Left vertebral artery dominant. Basilar patent. Decreased signal in the posterior cerebral arteries bilaterally likely due to tortuosity. Anatomic  variants: None Image quality degraded by moderate motion IMPRESSION: 1. Small acute infarct right centrum semiovale 2. 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 Motion degraded study of the brain. 3. MRA is limited by vessel tortuosity and motion. No large vessel occlusion identified Electronically Signed   By: Franchot Gallo M.D.   On: 07/19/2022 19:26   CT HEAD WO CONTRAST  Result Date: 07/19/2022 CLINICAL DATA:  Dizziness and left leg weakness EXAM: CT HEAD WITHOUT CONTRAST TECHNIQUE: Contiguous axial images were obtained from the base of the skull through the vertex without intravenous contrast. RADIATION DOSE REDUCTION: This exam was performed according to the departmental dose-optimization program which includes automated exposure control, adjustment of the mA and/or kV according to patient size and/or use of iterative reconstruction technique. COMPARISON:  None Available. FINDINGS: Brain: There is hypodensity in the right thalamic capsular region consistent with age-indeterminate but possibly acute or subacute infarct. There is no evidence of large vessel territorial infarct. There is no acute intracranial hemorrhage or extra-axial fluid collection. There are remote appearing infarcts in the left cerebral hemisphere white matter and mild background chronic small vessel ischemic change. Parenchymal volume is normal for age. The ventricles are normal in size. There is no mass lesion.  There is no mass effect or midline shift. Vascular: There is calcification of the bilateral carotid siphons and vertebral arteries. Skull: Normal. Negative for fracture or focal lesion. Sinuses/Orbits: There is mild mucosal thickening in the paranasal sinuses. Bilateral lens implants are in place. The globes and orbits are otherwise unremarkable. Other: None. IMPRESSION: 1. Small focus of hypodensity in the right thalamic capsular region is age indeterminate but could  reflect a small acute or subacute infarct. Consider brain MRI for further evaluation. 2. No evidence of large vessel territorial infarct or hemorrhage. Electronically Signed   By: Valetta Mole M.D.   On: 07/19/2022 14:56    Labs:  Basic Metabolic Panel: No results for input(s): "NA", "K", "CL", "CO2", "GLUCOSE", "BUN", "CREATININE", "CALCIUM", "MG", "PHOS" in the last 168 hours.  CBC: No results for input(s): "WBC", "NEUTROABS", "HGB", "HCT", "MCV", "PLT" in the last 168 hours.  CBG: No results for input(s): "GLUCAP" in the last 168 hours.  Family history.  Mother with hypertension as well as hypothyroidism.  Sister with depression.  Denies any colon cancer esophageal cancer or rectal cancer  Brief HPI:   Daniel [name] is a 76 y.o. right-handed male with history of hypertension as well as tobacco use..  Per chart review lives with daughter.  Independent prior to admission.  Presented 07/19/2022 with left-sided weakness and dizziness of acute onset.  No nausea vomiting or fever reported.  Blood pressure 205/120.  CT/MRI showed small acute infarct right centrum semiovale.  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  MRA showed no large vessel occlusion.  Admission chemistries unremarkable alcohol negative urine drug screen negative.  Echocardiogram with ejection fraction of 55 to 60% no wall motion abnormalities grade 1 diastolic dysfunction.  Neurology follow-up placed on aspirin and Plavix for CVA prophylaxis.  Hospital course increasing left-sided weakness 07/23/2022 MRI completed showing increased size of acute infarct involving the right corona radiata and basal ganglia.  MRI findings discussed with neurology to specific changes recommended remain  on low-dose aspirin Plavix x3 weeks followed by aspirin alone.  Lovenox for DVT prophylaxis.  Monitoring of permissive hypertension patient on losartan prior to admission.  Tolerating a regular  diet.  Patient nonspecific chest pain while eating breakfast troponin negative EKG mild sinus bradycardia.  No further work-up indicated.  Therapy evaluations completed due to patient decreased functional mobility was admitted for a comprehensive rehab program.   Hospital Course: Daniel [name] was admitted to rehab 07/24/2022 for inpatient therapies to consist of PT, ST and OT at least three hours five days a week. Past admission physiatrist, therapy team and rehab RN have worked together to provide customized collaborative inpatient rehab.  Pertaining to patient's right side corona radiata infarction remained stable aspirin and Plavix x3 weeks ending 9/22 then aspirin alone.  Lovenox for DVT prophylaxis no bleeding episodes.  Bouts of insomnia improved with the use of trazodone.  Permissive hypertension currently maintained on losartan follow-up outpatient.  Crestor for hyperlipidemia.  Bouts of constipation resolved with laxative assistance.  Left shoulder pain related to spasticity received Xeomin 150 U injection 9/15 with improvement.   Blood pressures were monitored on TID basis and remained controlled     Rehab course: During patient's stay in rehab weekly team conferences were held to monitor patient's progress, set goals and discuss barriers to discharge. At admission, patient required max assist 15 feet 2 person hand-held assist moderate assist step pivot transfers  Physical exam.  Blood pressure 152/80 pulse 55 temperature 96 respirations 18 oxygen saturation 97% room air Constitutional.  No acute distress HEENT Head.  Normocephalic and atraumatic.  Left facial droop Eyes.  Pupils round and reactive to light no discharge without nystagmus Neck.  Supple nontender no JVD without thyromegaly Cardiac regular rate and rhythm without any extra sounds or murmur heard Abdomen.  Soft nontender positive bowel sounds without rebound Respiratory effort normal no respiratory distress without  wheeze Musculoskeletal. Comments.  Right upper extremity 5/5 in biceps triceps, wrist extension grip and FA Left lower extremity 1/5 in same muscles Right lower extremity 5/5 in hip flexors knee extension knee flexion dorsi plantarflexion Left lower extremity 0/1/5 same muscles  He/She  has had improvement in activity tolerance, balance, postural control as well as ability to compensate for deficits. He/She has had improvement in functional use RUE/LUE  and RLE/LLE as well as improvement in awareness.  Supine to sit with minimal assist.  Assist patient with donning shoes.  Sit to stand and stand pivot transfers minimal assist throughout sessions.  Sit to stand from elevated mat table with minimal assist.  Upper body dressing patient able to doff and don a pullover style short while seated in wheelchair with good recall.  Lower body dressing max assist walk-in shower transfers moderate assist.  Mechanical soft diet discussed by speech therapy.  Full family teaching completed plan discharge to home       Disposition: Discharge to home    Diet: Mechanical soft  Special Instructions: No driving smoking or alcohol  Medications at discharge 1.  Tylenol as needed 2.  Aspirin 81 mg p.o. daily 3.  Cozaar 25 mg p.o. daily 4.  Crestor 20 mg p.o. daily 5.  Senokot S1 tablet p.o. twice daily 6.  Trazodone 75 mg p.o. nightly  30-35 minutes were spent completing discharge summary and discharge planning  Discharge Instructions     Ambulatory referral to Neurology   Complete by: As directed    An appointment is requested in approximately: 4 weeks  right corona radiata infarction   Ambulatory referral to Physical Medicine Rehab   Complete by: As directed    Moderate complexity follow-up 1 to 2 weeks right corona radiata infarction        Follow-up Information     Kirsteins, Luanna Salk, MD Follow up.   Specialty: Physical Medicine and Rehabilitation Why: Office to call for  appointment Contact information: [place] [address] [phone]                 Signed: Cathlyn Parsons 08/15/2022, 5:23 AM

## 2022-08-13 ENCOUNTER — Inpatient Hospital Stay (HOSPITAL_COMMUNITY): Payer: Medicare HMO

## 2022-08-13 MED ORDER — DICLOFENAC SODIUM 1 % EX GEL
2.0000 g | Freq: Four times a day (QID) | CUTANEOUS | Status: DC
  Administered 2022-08-13 – 2022-08-15 (×7): 2 g via TOPICAL
  Filled 2022-08-13: qty 100

## 2022-08-13 NOTE — Progress Notes (Signed)
Orthopedic Tech Progress Note Patient Details:  Daniel [name] 1946-05-09 [id number]  07/26/22 '@1519'$  ortho tech ordered from [facility] a RESTING HAND SPLINT and 07/31/22 myself I ordered a COCK UP SPLINT for the left wrist. Will call these same orders back in to [facility]. Will call LPN/RN to see if patient has these items before ordering them yet again   Patient ID: Daniel [name], male   DOB: [birth year], 76 y.o.   MRN: [id number]  [name] 08/13/2022, 8:51 AM

## 2022-08-13 NOTE — Progress Notes (Signed)
Patient ID: Daniel [name], male   DOB: Aug 16, 1946, 76 y.o.   MRN: [id number]  Wheelchair and rolling walker ordered through [facility].

## 2022-08-13 NOTE — Progress Notes (Signed)
PROGRESS NOTE   Subjective/Complaints:  No issues overnite except left wrist pain, no apparent injury or prior hx, remains plegic LUE   ROS: Patient denies CP, SOB, N/V/D    Objective:   No results found. No results for input(s): "WBC", "HGB", "HCT", "PLT" in the last 72 hours. No results for input(s): "NA", "K", "CL", "CO2", "GLUCOSE", "BUN", "CREATININE", "CALCIUM" in the last 72 hours.  Intake/Output Summary (Last 24 hours) at 08/13/2022 0824 Last data filed at 08/13/2022 8416 Gross per 24 hour  Intake 832 ml  Output 625 ml  Net 207 ml         Physical Exam: Vital Signs reviewed as above.    General: No acute distress Mood and affect are appropriate Heart: Regular rate and rhythm no rubs murmurs or extra sounds Lungs: Clear to auscultation, breathing unlabored, no rales or wheezes Abdomen: Positive bowel sounds, soft nontender to palpation, nondistended Extremities: No clubbing, cyanosis, or edema Skin: No evidence of breakdown, no evidence of rash   Neurologic: Cranial nerves II through XII intact, motor strength is 5/5 in RIght deltoid, bicep, tricep, grip, hip flexor, knee extensors, ankle dorsiflexor and plantar flexor 2- triceps o/w 0/5 LUE, 3- HF and KE , 3- hamstrings on Left 2/5 toe flexion Trace LUE adduction  Sensory exam normal sensation to light touch and proprioception in bilateral upper and lower extremities Tone- MAS 1-2 in Left pec   Musculoskeletal: minimal Left wrist effusion pain with flexion and extension , no joint deformity        Assessment/Plan: 1. Functional deficits which require 3+ hours per day of interdisciplinary therapy in a comprehensive inpatient rehab setting. Physiatrist is providing close team supervision and 24 hour management of active medical problems listed below. Physiatrist and rehab team continue to assess barriers to discharge/monitor patient progress toward  functional and medical goals  Care Tool:  Bathing    Body parts bathed by patient: Left arm, Chest, Abdomen, Front perineal area, Buttocks, Right upper leg, Left upper leg, Right lower leg, Left lower leg, Face   Body parts bathed by helper: Right arm     Bathing assist Assist Level: Minimal Assistance - Patient > 75%     Upper Body Dressing/Undressing Upper body dressing   What is the patient wearing?: Pull over shirt    Upper body assist Assist Level: Contact Guard/Touching assist    Lower Body Dressing/Undressing Lower body dressing      What is the patient wearing?: Pants     Lower body assist Assist for lower body dressing: Minimal Assistance - Patient > 75%     Toileting Toileting    Toileting assist Assist for toileting: Maximal Assistance - Patient 25 - 49% Assistive Device Comment: bedpan   Transfers Chair/bed transfer  Transfers assist  Chair/bed transfer activity did not occur: Safety/medical concerns  Chair/bed transfer assist level: Minimal Assistance - Patient > 75% (squat pivot to R side)     Locomotion Ambulation   Ambulation assist   Ambulation activity did not occur: Safety/medical concerns  Assist level: Moderate Assistance - Patient 50 - 74% Assistive device: Walker-rolling Max distance: 77f   Walk 10 feet activity  Assist  Walk 10 feet activity did not occur: Safety/medical concerns  Assist level: Moderate Assistance - Patient - 50 - 74% Assistive device: Walker-rolling   Walk 50 feet activity   Assist Walk 50 feet with 2 turns activity did not occur: Safety/medical concerns  Assist level: Moderate Assistance - Patient - 50 - 74% Assistive device: Walker-rolling    Walk 150 feet activity   Assist Walk 150 feet activity did not occur: Safety/medical concerns         Walk 10 feet on uneven surface  activity   Assist Walk 10 feet on uneven surfaces activity did not occur: Safety/medical concerns          Wheelchair     Assist Is the patient using a wheelchair?: Yes Type of Wheelchair: Manual    Wheelchair assist level: Dependent - Patient 0%      Wheelchair 50 feet with 2 turns activity    Assist        Assist Level: Dependent - Patient 0%   Wheelchair 150 feet activity     Assist      Assist Level: Dependent - Patient 0%   Blood pressure 107/61, pulse 60, temperature 98.7 F (37.1 C), temperature source Oral, resp. rate 18, height '5\' 10"'$  (1.778 m), weight 79.3 kg, SpO2 94 %.  Assessment and plan: 1. 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  Antithrombotics: -DVT/anticoagulation:  Pharmaceutical: Lovenox             -antiplatelet therapy: Aspirin 81 mg daily and Plavix 75 mg day x3 weeks (end 9/22) then aspirin alone 3. Pain Management: Tylenol as needed 4. Mood/Behavior/Sleep: Provide emotional support             -antipsychotic agents: N/A             - 9/17 - inc trazodone form 50 to 75 mg qhs --sleep improved 5. Neuropsych/cognition: This patient is capable of making decisions on his own behalf. 6. Skin/Wound Care: Routine skin checks 7. Fluids/Electrolytes/Nutrition: Routine in and outs with follow-up chemistries Discussed with patient importance of fluid intake to prevent dehydration, Will ask nursing to encourage fluids, recheck BMET 9/25 8.  Permissive hypertension.  Decrease Losartan to '25mg'$ , continue this dose             9/15- BP controlled- cont' regimen             9/23 bp well controlled    08/13/2022    2:23 AM 08/12/2022    6:49 PM 08/12/2022    1:31 PM  Vitals with BMI  Systolic 149 702 637  Diastolic 61 76 69  Pulse 60 72 72      9.  Hyperlipidemia.  continue Crestor  10.  COnstipation add sennaS 2 po BID              -Order sorbitol              9/15- LBM 9/13- if no BM by tomorrow- will need sorbitol again.              9/21--had BM   11.  Left shoulder pain related  to spasticity -starting to take effect, no pain with ROM              - Xeomin 150U injection 9/15- no pain   Max effect expect 9/29 12.  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

## 2022-08-13 NOTE — Progress Notes (Signed)
Physical Therapy Session Note  Patient Details  Name: Daniel [name] MRN: [id number] Date of Birth: Jan 22, 1946  Today's Date: 08/13/2022 PT Individual Time: 0907-1005 PT Individual Time Calculation (min): 58 min   Short Term Goals: Week 3:  PT Short Term Goal 1 (Week 3): = to LTGs based on ELOS  Skilled Therapeutic Interventions/Progress Updates:    Pt received supine in bed with his daughters present for family education/training and pt agreeable to therapy session. Pt reports he had severe L wrist pain last night that lasted for ~4hrs and pt noticed to have increased swelling around his wrist as well as sensitivity to touch and needing more support and guarding of that wrist today - MD aware and ordered an x-ray.  Therapist educated family on the following:  - proper support and positioning of L hemibody (primarily L UE) - use of gait belt  - donning/doffing L LE AFO  - wheelchair part management and how to break down and put together wheelchair for transport  - recommendation of hospital bed and getting in/out of bed on R side to allow pt increased independence  Pt's daughter performed all hands-on assistance during session with therapist providing verbal step-by-step direction/cuing on set-up and sequencing of all tasks with pt's daughter reporting she learns best with hands-on training.  Supine>sitting R EOB, HOB slightly elevated and using bedrail, with pt's daughter providing min assist for L hemibody (UE>LE) management and for sitting balance. Pt sat on EOB with close supervision and intermittent CGA using R UE support on bedrail while pt's daughter threaded on pants and donned socks, shoes, and L LE AFO.  Sit>stand EOB>RW with pt's daughter providing heavy min assist for balance and managing L UE (pt needing increased support today due to wrist pain) - therapist cuing on proper use of gait belt and monitoring of pt's balance throughout due to his tendency for L posterior LOB -  pulled pants up over hips max assist.  Therapist educated on squat pivot transfer technique EOB<>w/c and pt's daughter providing hands-on assistance; however, determined that best technique would be stand pivots due to pt's assistance level and the height of pt's daughter therefore transitioned to this.   Performed block practice stand pivot transfers w/c<>EOB with pt's daughter providing min assist and demonstrating excellent technique of assisting pt with stepping/moving L LE during the transfer and pt tending to use the bedrail support with RUE when transferring R back towards EOB.   Pt's daughter reports feeling comfortable assisting pt with everything covered today. Plan is to perform real-life car transfer training tomorrow. At end of session, pt left seated in w/c with L UE supported on padded 1/2 lap tray for wrist comfort, and his daughter present.    Therapy Documentation Precautions:  Precautions Precautions: Fall, Other (comment) Precaution Comments: L hemiplegia, poor postural control Restrictions Weight Bearing Restrictions: No   Pain:  See above - L wrist pain.    Therapy/Group: Individual Therapy  Tawana Scale , PT, DPT, NCS, CSRS 08/13/2022, 7:40 AM

## 2022-08-13 NOTE — Progress Notes (Signed)
Physical Therapy Session Note  Patient Details  Name: Daniel [name] MRN: [id number] Date of Birth: Aug 07, 1946  Today's Date: 08/13/2022 PT Individual Time: 1332-1445 PT Individual Time Calculation (min): 73 min   Short Term Goals: Week 1:  PT Short Term Goal 1 (Week 1): Pt will perform supine<>sit with mod assist consistently PT Short Term Goal 1 - Progress (Week 1): Met PT Short Term Goal 2 (Week 1): Pt will perform sit<>stands using LRAD with mod assist PT Short Term Goal 2 - Progress (Week 1): Met PT Short Term Goal 3 (Week 1): Pt will perform bed<>chair transfers using LRAD with mod assist PT Short Term Goal 3 - Progress (Week 1): Met PT Short Term Goal 4 (Week 1): Pt will ambulate at least 45f with +2 mod assist PT Short Term Goal 4 - Progress (Week 1): Met Week 2:  PT Short Term Goal 1 (Week 2): Pt will perform supine<>sit with min assist consistently PT Short Term Goal 2 (Week 2): Pt will perform sit<> stand with min assist consistently PT Short Term Goal 2 - Progress (Week 2): Met PT Short Term Goal 3 (Week 2): Pt will perform squat pivot transfer with min assist consistently PT Short Term Goal 3 - Progress (Week 2): Met PT Short Term Goal 4 (Week 2): Pt will ambulate +638fwith mod assist using LRAD (+2 WC as needed for safety). PT Short Term Goal 4 - Progress (Week 2): Progressing toward goal PT Short Term Goal 5 (Week 2): Pt will initiate stair training. PT Short Term Goal 5 - Progress (Week 2): Met Week 3:  PT Short Term Goal 1 (Week 3): = to LTGs based on ELOS  Skilled Therapeutic Interventions/Progress Updates:  Patient supine in bed on entrance to room. Patient alert and agreeable to PT session. Relates lying down d/t pain in L wrist.   Patient with no pain complaint at start of session.  Therapeutic Activity: Bed Mobility: Pt performed supine <> sit with ability to bring BLE off EOB and MinA to push UB from bed surface to upright seated position. VC/ tc required  for technique in push of UB from bed surface. Transfers: Pt performed sit<>stand and stand pivot transfers throughout session with Min/ ModA as pt rocks into launch up to standing. NMR during session to work on positioning of feet and self toward edge of seat with hip hinge forward in order to use BLE and RUE to push up to standing with CGA and controlled attainment of balance at HWFlatirons Surgery Center LLCProvided verbal cues for improved technique.  Gait Training:  Pt provided with HW for ambulation this day as L wrist is too sore to manage any ROM or pressure into hand/ wrist. Pt ambulated 10' x1/ 25' x3 using HW with overall MinA. Demonstrated improved technique over using RW with L hand saddle splint. Provided vc/ tc for sequencing AD, sequencing steps, increasing LLE hip/ knee flexion for foot clearance, positioning of L foot with advancement, weight shift to R for improved LLE clearance.  Wheelchair Mobility:  Pt propelled wheelchair >200' x1 and >100' x1 using hemitechnique with overall supervision. Pt demos veer to R side consistently but is able to use RLE to straighten path. Completes established corners with supervision. Provided intermittent vc for hemi-technique and managing turns in space to parallel park at mat table.   Neuromuscular Re-ed: NMR facilitated during session with focus on standing balance. Pt guided in improved technique in rise to stand in order to control rise into stance  rather then to propel upward with RUE push and momentum build. Pt attempting to rise to stand to [facility] but requires Min/ Mod A to control and attain standing balance. Pt not allowed to count prior to push to stand. Educated and provided with tc for improving position at edge of seat, positioning BLE under seat, leaning forward with hip hinge in order to feel change in weight shift from seat to BLE and then perform BLE extensor push into stance. Pt with significant improvement in rise to stand and controlled sit with focus on  technique and attaining balance prior to placing hand on HW. Blocked practice x5 with ability to rise to stand with CGA by end of practice. NMR performed for improvements in motor control and coordination, balance, sequencing, judgement, and self confidence/ efficacy in performing all aspects of mobility at highest level of independence.   Patient supine in bed at end of session with brakes locked, bed alarm set, and all needs within reach.   Therapy Documentation Precautions:  Precautions Precautions: Fall, Other (comment) Precaution Comments: L hemiplegia, poor postural control Restrictions Weight Bearing Restrictions: No General:   Vital Signs: Therapy Vitals Temp: 98.5 F (36.9 C) Temp Source: Oral Pulse Rate: 66 Resp: 20 BP: 123/68 Patient Position (if appropriate): Lying Oxygen Therapy SpO2: 98 % O2 Device: Room Air Pain: Pain Assessment Pain Score: 2   Therapy/Group: Individual Therapy  Alger Simons PT, DPT, CSRS 08/13/2022, 2:51 PM

## 2022-08-13 NOTE — Progress Notes (Signed)
Patient ID: Daniel [name], male   DOB: Mar 30, 1946, 76 y.o.   MRN: [id number]  Hospital Bed, Bedside Commode and Shower Seat ordered through [facility].

## 2022-08-14 ENCOUNTER — Other Ambulatory Visit (HOSPITAL_COMMUNITY): Payer: Self-pay

## 2022-08-14 MED ORDER — SENNOSIDES-DOCUSATE SODIUM 8.6-50 MG PO TABS: 1.0000 | ORAL_TABLET | Freq: Two times a day (BID) | ORAL | Status: DC

## 2022-08-14 MED ORDER — ROSUVASTATIN CALCIUM 20 MG PO TABS
20.0000 mg | ORAL_TABLET | Freq: Every day | ORAL | 0 refills | Status: DC
  Filled 2022-08-14: qty 30, 30d supply, fill #0

## 2022-08-14 MED ORDER — LOSARTAN POTASSIUM 25 MG PO TABS
25.0000 mg | ORAL_TABLET | Freq: Every day | ORAL | 0 refills | Status: DC
  Filled 2022-08-14: qty 30, 30d supply, fill #0

## 2022-08-14 MED ORDER — TRAZODONE HCL 150 MG PO TABS
75.0000 mg | ORAL_TABLET | Freq: Every day | ORAL | 0 refills | Status: DC
  Filled 2022-08-14: qty 30, 60d supply, fill #0

## 2022-08-14 MED ORDER — ACETAMINOPHEN 325 MG PO TABS: 650.0000 mg | ORAL_TABLET | ORAL | Status: DC | PRN

## 2022-08-14 NOTE — Progress Notes (Signed)
Patient ID: Daniel [name], male   DOB: 1946/04/03, 76 y.o.   MRN: [id number]  TTB ordered through [facility].

## 2022-08-14 NOTE — Progress Notes (Signed)
Inpatient Rehabilitation Discharge Medication Review by a Pharmacist  A complete drug regimen review was completed for this patient to identify any potential clinically significant medication issues.  High Risk Drug Classes Is patient taking? Indication by Medication  Antipsychotic No   Anticoagulant No   Antibiotic No   Opioid No   Antiplatelet Yes ASA - stroke  Hypoglycemics/insulin No   Vasoactive Medication Yes Losartan - HTN  Chemotherapy No   Other Yes Crestor - HLD Trazodone - sleep     Type of Medication Issue Identified Description of Issue Recommendation(s)  Drug Interaction(s) (clinically significant)     Duplicate Therapy     Allergy     No Medication Administration End Date     Incorrect Dose     Additional Drug Therapy Needed     Significant med changes from prior encounter (inform family/care partners about these prior to discharge).    Other       Clinically significant medication issues were identified that warrant physician communication and completion of prescribed/recommended actions by midnight of the next day:  No  Name of provider notified for urgent issues identified:   Provider Method of Notification:   Pharmacist comments:   Time spent performing this drug regimen review (minutes):  Thomas, PharmD, BCPS 08/14/2022 2:09 PM

## 2022-08-14 NOTE — Progress Notes (Signed)
Occupational Therapy Session Note  Patient Details  Name: Daniel [name] MRN: [id number] Date of Birth: 06-15-1946  Today's Date: 08/14/2022 OT Individual Time: 4098-1191 OT Individual Time Calculation (min): 69 min    Short Term Goals: Week 3:  OT Short Term Goal 1 (Week 3): LTG= STG  Skilled Therapeutic Interventions/Progress Updates:  Pt awake in w/c with daughter present upon OT arrival to the room. Pt reports, "I think that will work," referring to [employer]. Pt in agreement for OT session.   Therapy Documentation Precautions:  Precautions Precautions: Fall, Other (comment) Precaution Comments: L hemiplegia, poor postural control Restrictions Weight Bearing Restrictions: No Vital Signs: Please see "Flowsheet" for most recent vitals charted by nursing staff.  Pain: Pain Assessment Pain Scale: 0-10 Pain Score: 0-No pain  ADL: Pt able to doff and don a pull-over style shirt with minimal assistance while seated in the w/c. Initially pt's daughter provides maximal assistance, however, OT provides education in hemi-techniques and importance of encouraging highest level of independence possible. Then pt able to demo to daughter ability to complete with minimal - CGA using hemi-techniques while seated in w/c. Pt's daughter reports plan to perform LB dressing at bed level and has provided assist using this technique with pt during this admission and reports no concerns with this.   Family Training: Pt's daughter present for family training session. Pt's daughter reports concerns about shower transfer due to limited space with hinge-style door. Pt's daughter reports having a garden tub that would improve room for daughter to provide physical assist during transfers. OT provides education on use of tub-transfer bench to improve safety with tub-transfer and bathing techniques to encourage highest level of independence possible using LH sponge and hand-held shower hose with a  cut-out on tub-bench. OT recommends pt's daughter purchase an attachment for the tub-faucet to make it a hand-held shower hose. OT provides skilled demo for w/c positioning and technique to use tub-transfer bench with similar set-up to home environment. After skilled demo, pt's daughter able to provide adequate minimal - moderate assist for SPT form w/c <> tub-bench with good safety awareness. Educated pt and daughter on recommended ADL routine to get undressed in bedroom, don robe and non-skid socks, transfer to w/c, and then transfer to tub-bench and doff all clothing. OT provides education on technique to place [facility] over toilet and perform safe SPT and use of hemi-walker for pt to maintain standing balance while daughter provides assistance with clothing management and toileting hygiene. Pt and pt's daughter verbalize understanding of all education provided and reports no other concerns at this time. Pt and daughter report plan to utilize briefs and [facility] for nighttime toileting with pt using phone to call daughter when assist is needed as pt as adequately utilize call light to provide assist during admission and demo's no impulsive behaviors. Pt's daughter able to perform SPT to the R form w/c > EOB providing adequate minimal assist with no AD and provide assist to transition to supine. OT consulted with social worker about need to change DME from shower chair to tub-bench and ideally a tub-bench with a cut-out if able. Educated pt's daughter that sponge baths can be performed if daughter performs a "dry run" for transfer with pt clothes and does not feel comfortable then can await [place] OT to actually perform 1st shower after DC. Daughter verbalizes understanding.   Pt returned to bed at end of session. Pt left resting comfortably in bed with personal belongings and call light within  reach, bed alarm on and activated, bed in low position, 3 bed rails up, daughter and nursing student present in room, and comfort  needs attended to.   Therapy/Group: Individual Therapy  Barbee Shropshire 08/14/2022, 12:43 PM

## 2022-08-14 NOTE — Progress Notes (Signed)
Patient ID: Daniel [name], male   DOB: Aug 22, 1946, 76 y.o.   MRN: [id number]  Patient [facility] referral sent to [facility].

## 2022-08-14 NOTE — Progress Notes (Signed)
Inpatient Rehabilitation Care Coordinator Discharge Note   Patient Details  Name: Daniel [name] MRN: [id number] Date of Birth: 1946/10/11   Discharge location: Home  Length of Stay: 22 Days  Discharge activity level: cga-min  Home/community participation: angela (daughter)  Patient response BM:BOMQTT Literacy - How often do you need to have someone help you when you read instructions, pamphlets, or other written material from your doctor or pharmacy?: Never  Patient response CN:GFREVQ Isolation - How often do you feel lonely or isolated from those around you?: Never  Services provided included: MD, RD, OT, SLP, PT, RN, CM, TR, Pharmacy, SW  Financial Services:  Charity fundraiser Utilized: [employer]  Choices offered to/list presented to:    Follow-up services arranged:  [place]: [phone]         Patient response to transportation need: Is the patient able to respond to transportation needs?: Yes In the past 12 months, has lack of transportation kept you from medical appointments or from getting medications?: No In the past 12 months, has lack of transportation kept you from meetings, work, or from getting things needed for daily living?: No    Comments (or additional information):  Patient/Family verbalized understanding of follow-up arrangements:  Yes  Individual responsible for coordination of the follow-up plan: Levada Dy [phone]  Confirmed correct DME delivered: Dyanne Iha 08/14/2022    Dyanne Iha

## 2022-08-14 NOTE — Progress Notes (Signed)
Physical Therapy Session Note  Patient Details  Name: Daniel [name] MRN: [id number] Date of Birth: [birth year]  Today's Date: 08/14/2022 PT Individual Time: 0904-1018 PT Individual Time Calculation (min): 74 min   Short Term Goals: Week 3:  PT Short Term Goal 1 (Week 3): = to LTGs based on ELOS  Skilled Therapeutic Interventions/Progress Updates:     Pt greeted supine in bed with daughter present for family education. Pt agreeable to therapy and reporting no pain. Donned wrist splint prior to mobility due to recent acute left wrist pain. X-ray negative for fx.  Family education focused on bed mobility and transfers from bed, WC, and car. Began education of bed mobility and transfers from bed and WC yesterday. Car transfers education is new this session.   Sup>sit to EOB with assistance from daughter for L LE and UE management and pt heavily using R bed railing. Pt cued to lean trunk forward while sitting EOB to prevent falling backward onto bed. Daughter assisted pt with donning pants, socks, shoes, and L AFO with adequate guarding while pt performs dynamic sitting balance during dressing.  Daughter then assisted pt from EOB to [facility] via stand pivot method and gave appropriate guarding and cues to patient for sequencing. Safe guarding the handling of L UE and LE performed by daughter.  Educated daughter on [facility] management including demonstrations on how to lock and unlock breaks, how to take off break handles, seat pad placement and how to strap in, and how to lengthen foot rests. Daughter was able to demonstrate back to SPT and PT well with good technique.   Real life car transfer performed x 5 using a stand pivot technique from WC to car. Car transfer demonstrated by SPT and PT x 2 with education on hand and foot placement, appropriate places for pt to hold on for UE support, and sequencing. Daughter and patient then practiced x 3. Pt cued to slow down and not rush to grab items for support upon  standing and to wait until daughter verbalizes she is ready when transitioning to the next step. Daughter guards patient safely and correctly manages L UE and LE.   Pt left sitting up in chair with no c/o wrist pain, call bell in reach, and daughter present.   Therapy Documentation Precautions:  Precautions Precautions: Fall, Other (comment) Precaution Comments: L hemiplegia, poor postural control Restrictions Weight Bearing Restrictions: No   Therapy/Group: Individual Therapy  Kayleen Memos, SPT 08/14/2022, 12:19 PM

## 2022-08-14 NOTE — Progress Notes (Signed)
Patient left hand splint on as ordered. Decreased edema noted. Left hand elevated. Patient being DC tomorrow, home with daughter. Patient nor daughter have questions or concerns at this time.

## 2022-08-14 NOTE — Progress Notes (Signed)
Physical Therapy Session Note  Patient Details  Name: Daniel [name] MRN: [id number] Date of Birth: 11-Jan-1946  Today's Date: 08/14/2022 PT Individual Time: 1402-1430 PT Individual Time Calculation (min): 28 min   Short Term Goals: Week 3:  PT Short Term Goal 1 (Week 3): = to LTGs based on ELOS  Skilled Therapeutic Interventions/Progress Updates:     Pt supine in bed, watching "metal scraping" on youtube. Pt reports that this was his hobby prior to hospitalization. Pt agreeable to PT tx and denies pain.   Donned shoes and L GRAFO with dependant assist. Supine<>sitting EOB with HOB elevated, minA for initiation and for L hemibody support. Able to sit unsupported at EOB without assist. Stand<>pivot transfer with minA towards his weaker L side, poor controlled lowering to sit and decreased safety awareness.   Patient requests assistance in changing his brief due to smell. Wheeled to end of the bed and patient placed in w/c facing the foot of the bed. Used the bed rail at foot of bed to assist with sit<>stand transfers which he did so with supervision.   Pt assisted with posterior pericare - small smears that required cleaning. Brief change completed with totalA. Pt standing with supervision while pericare was completed (UE support to bed rail).   Standing marching with minA for lateral weight shifting and for assisting with foot placement on L due to decreased knee flexor activation. Pt with decreased awareness of overcrowding of the feet unless made aware. Completed 3x10.  Concluded session seated in w/c with safety belt alarm on, 1/2 lap tray supporting LUE, call bell in lap, all needs met.    Therapy Documentation Precautions:  Precautions Precautions: Fall, Other (comment) Precaution Comments: L hemiplegia, poor postural control Restrictions Weight Bearing Restrictions: No General:     Therapy/Group: Individual Therapy  Dezmond Downie P Javonni Macke 08/14/2022, 2:03 PM

## 2022-08-14 NOTE — Progress Notes (Signed)
Physical Therapy Discharge Summary  Patient Details  Name: Daniel [name] MRN: [id number] Date of Birth: [birth year]  Date of Discharge from PT service:August 14, 2022  Today's Date: 08/14/2022 PT Individual Time: 1620-1740 PT Individual Time Calculation (min): 80 min    Patient has met 8 of 9 long term goals due to improved activity tolerance, improved balance, improved postural control, increased strength, ability to compensate for deficits, functional use of  left lower extremity, improved awareness, and improved coordination.  Patient to discharge at a wheelchair level Supervision and min assist stand pivot transfers. [name] care partner, Glenard Haring (daughter) attended hands on education and is independent to provide the necessary physical assistance at discharge.  Reasons goals not met: goal of min assist for car transfers not met due to moderate assistance needed to help pt manage L UE and LE as well as assistance with maintaining balance and assisting with turning and scooting in the car seat.  Stair goal no longer applicable due to patient's family getting a ramp installed.   Recommendation:  Patient will benefit from ongoing skilled PT services in home health setting to continue to advance safe functional mobility, address ongoing impairments in overall balance, gait pattern and safety, use of DME, strength, endurance, coordination, postural awareness, and minimize fall risk.  Equipment: RW, manual 18x18 WC, hospital bed   Reasons for discharge: treatment goals met and discharge from hospital  Patient/family agrees with progress made and goals achieved: Yes  Skilled Therapeutic Interventions/Progress Updates:  Pt greeted sitting up in [facility] and agreeable to therapy. No c/o pain.  Proceeded to test sensation and LE strength while seated in WC in room.  Pt performed WC mobility from room to main therapy gym 185f with supervision.   [facility]  demonstrates increased fall risk as noted by score of   9/56.  (<36= high risk for falls, close to 100%; 37-45 significant >80%; 46-51 moderate >50%; 52-55 lower >25%).   Pt wearing [employer] Max GRAFO L LE.  Pt amb 60 ft x 2 with hemi-walker on right side with mod assist. He required heavy cuing for sequencing and technique of when to progress hemi-walker and when to progress R LE and L LE. Pt would be able to follow directions for ~2-3 steps before gait pattern would break down and cues would be needed again. One LOB occurred with mod assist to regain balance.   Mobility also assessed throughout session, see discharge notes below.   Pt left supine in bed with HOB elevate and dinner tray placed within reach. Call bell in reach and bed alarm on.   PT Discharge Precautions/Restrictions Precautions Precautions: Fall;Other (comment) Precaution Comments: L hemiplegia, poor postural control Restrictions Weight Bearing Restrictions: No Pain Pain Assessment Pain Scale: 0-10 Pain Score: 0-No pain Pain Interference Pain Interference Pain Effect on Sleep: 2. Occasionally Pain Interference with Therapy Activities: 1. Rarely or not at all Pain Interference with Day-to-Day Activities: 1. Rarely or not at all Vision/Perception  Vision - History Ability to See in Adequate Light: 0 Adequate Perception Perception: Within Functional Limits Inattention/Neglect: Appears intact Praxis Praxis: Impaired Praxis Impairment Details: Motor planning  Cognition Overall Cognitive Status: Within Functional Limits for tasks assessed Arousal/Alertness: Awake/alert Orientation Level: Oriented X4 Attention: Focused;Sustained;Selective Focused Attention: Appears intact Sustained Attention: Appears intact Selective Attention: Impaired Memory: Appears intact Problem Solving: Impaired Behaviors: Impulsive (mildy) Safety/Judgment: Appears intact Sensation Sensation Light Touch: Impaired Detail Central  sensation comments: pt reports numbness and tingling in  L hemibody but is able to feel light touch in all points on L LE during assessment Hot/Cold: Not tested Proprioception: Impaired Detail Proprioception Impaired Details: Impaired LLE;Impaired LUE Stereognosis: Not tested Coordination Gross Motor Movements are Fluid and Coordinated: No Coordination and Movement Description: impaired due to dense L hemiplegia (UE more impaired than LE) with impaired core/trunk strength Motor  Motor Motor: Hemiplegia;Abnormal tone;Abnormal postural alignment and control Motor - Skilled Clinical Observations: dense L hemiplegia (UE more impaired than LE), hypotonia in L LE and flaccidity in L UE  Mobility Bed Mobility Bed Mobility: Supine to Sit;Sit to Supine;Rolling Right Rolling Right: Minimal Assistance - Patient > 75% Supine to Sit: Minimal Assistance - Patient > 75% Sit to Supine: Minimal Assistance - Patient > 75% Transfers Transfers: Sit to Stand;Stand to Sit;Squat Pivot Transfers;Stand Pivot Transfers Sit to Stand: Contact Guard/Touching assist Stand to Sit: Contact Guard/Touching assist Stand Pivot Transfers: Minimal Assistance - Patient > 75% Stand Pivot Transfer Details: Verbal cues for sequencing;Verbal cues for technique;Verbal cues for precautions/safety;Verbal cues for gait pattern;Verbal cues for safe use of DME/AE;Manual facilitation for weight shifting;Manual facilitation for placement;Tactile cues for sequencing;Tactile cues for weight shifting;Tactile cues for placement Squat Pivot Transfers: Minimal Assistance - Patient > 75% Transfer (Assistive device): None Locomotion  Gait Ambulation: Yes Gait Assistance: Moderate Assistance - Patient 50-74% Gait Distance (Feet): 60 Feet Assistive device: Hemi-walker (right hand) Gait Assistance Details: Verbal cues for sequencing;Verbal cues for technique;Verbal cues for precautions/safety;Verbal cues for gait pattern;Verbal cues for safe use  of DME/AE;Visual cues for safe use of DME/AE Gait Gait: Yes Gait Pattern: Impaired Gait Pattern: Poor foot clearance - left;Decreased hip/knee flexion - left;Decreased weight shift to right Gait velocity: decreased Stairs / Additional Locomotion Stairs: No Wheelchair Mobility Wheelchair Mobility: Yes Wheelchair Assistance: Chartered loss adjuster: Right upper extremity;Right lower extremity Wheelchair Parts Management: Supervision/cueing Distance: 176f  Trunk/Postural Assessment  Cervical Assessment Cervical Assessment: Within Functional Limits Thoracic Assessment Thoracic Assessment: Within Functional Limits Lumbar Assessment Lumbar Assessment: Exceptions to [facility](posterior pelvic tilt in sitting) Postural Control Postural Control: Deficits on evaluation Righting Reactions: Severely delayed with left LOB Protective Responses: absent to left  Balance Balance Balance Assessed: Yes Standardized Balance Assessment Standardized Balance Assessment: Berg Balance Test Berg Balance Test Sit to Stand: Needs minimal aid to stand or to stabilize Standing Unsupported: Able to stand 30 seconds unsupported Sitting with Back Unsupported but Feet Supported on Floor or Stool: Able to sit 2 minutes under supervision Stand to Sit: Uses backs of legs against chair to control descent Transfers: Needs one person to assist Standing Unsupported with Eyes Closed: Needs help to keep from falling Standing Ubsupported with Feet Together: Needs help to attain position and unable to hold for 15 seconds (consistent L lean with pt unable to correct) From Standing, Reach Forward with Outstretched Arm: Loses balance while trying/requires external support From Standing Position, Pick up Object from Floor: Unable to try/needs assist to keep balance (able to pick up shoe with min assist) From Standing Position, Turn to Look Behind Over each Shoulder: Needs assist to keep from losing  balance and falling Turn 360 Degrees: Needs assistance while turning Standing Unsupported, Alternately Place Feet on Step/Stool: Needs assistance to keep from falling or unable to try Standing Unsupported, One Foot in Front: Loses balance while stepping or standing Standing on One Leg: Unable to try or needs assist to prevent fall Total Score: 9 Static Sitting Balance Static Sitting - Balance Support: Feet supported;Right upper extremity supported Static  Sitting - Level of Assistance: 5: Stand by assistance Dynamic Sitting Balance Dynamic Sitting - Balance Support: Feet supported;Right upper extremity supported Dynamic Sitting - Level of Assistance: 5: Stand by assistance Dynamic Sitting - Balance Activities: Lateral lean/weight shifting;Forward lean/weight shifting;Reaching for objects Sitting balance - Comments: posterior lean Static Standing Balance Static Standing - Balance Support: No upper extremity supported Static Standing - Level of Assistance: 4: Min assist Dynamic Standing Balance Dynamic Standing - Balance Support: During functional activity;Right upper extremity supported Dynamic Standing - Level of Assistance: 4: Min assist Dynamic Standing - Balance Activities: Reaching for objects;Forward lean/weight shifting Extremity Assessment   RLE Assessment RLE Assessment: Within Functional Limits Active Range of Motion (AROM) Comments: WFL/WNL General Strength Comments: grossly 5/5 assessed in sitting LLE Assessment LLE Assessment: Exceptions to [facility] Passive Range of Motion (PROM) Comments: [facility] General Strength Comments: assessed in sitting LLE Strength Left Hip Flexion: 3-/5 Left Knee Flexion: 3-/5 Left Knee Extension: 4-/5 Left Ankle Dorsiflexion: 0/5 Left Ankle Plantar Flexion: 0/5 LLE Tone LLE Tone: Moderate;Hypotonic   Kayleen Memos, SPT 08/14/2022, 4:10 PM

## 2022-08-14 NOTE — Progress Notes (Signed)
PROGRESS NOTE   Subjective/Complaints:  Wrist pain improved  ROS: Patient denies CP, SOB, N/V/D    Objective:   DG Wrist Complete Left  Result Date: 08/13/2022 CLINICAL DATA:  Pain EXAM: LEFT WRIST - COMPLETE 3+ VIEW COMPARISON:  None Available. FINDINGS: No recent fracture or dislocation is seen. There are subcortical cysts in distal ulna, lunate and scaphoid, possibly due to degenerative changes. Degenerative changes are noted in first carpometacarpal joint with joint space narrowing and bony spurs. Subcortical cysts are seen in the head and base of first metacarpal, possibly due to degenerative changes. Small bony spurs seen in first metacarpophalangeal joint. IMPRESSION: No recent fracture or dislocation is seen in left wrist. Degenerative changes in multiple joints as described in the body of the report. Electronically Signed   By: Elmer Picker M.D.   On: 08/13/2022 13:37   No results for input(s): "WBC", "HGB", "HCT", "PLT" in the last 72 hours. No results for input(s): "NA", "K", "CL", "CO2", "GLUCOSE", "BUN", "CREATININE", "CALCIUM" in the last 72 hours.  Intake/Output Summary (Last 24 hours) at 08/14/2022 0939 Last data filed at 08/14/2022 0733 Gross per 24 hour  Intake 708 ml  Output 1375 ml  Net -667 ml         Physical Exam: Vital Signs reviewed as above.    General: No acute distress Mood and affect are appropriate Heart: Regular rate and rhythm no rubs murmurs or extra sounds Lungs: Clear to auscultation, breathing unlabored, no rales or wheezes Abdomen: Positive bowel sounds, soft nontender to palpation, nondistended Extremities: No clubbing, cyanosis, or edema Skin: No evidence of breakdown, no evidence of rash   Neurologic: Cranial nerves II through XII intact, motor strength is 5/5 in RIght deltoid, bicep, tricep, grip, hip flexor, knee extensors, ankle dorsiflexor and plantar flexor 2- triceps  o/w 0/5 LUE, 3- HF and KE , 3- hamstrings on Left 2/5 toe flexion Trace LUE adduction  Sensory exam normal sensation to light touch and proprioception in bilateral upper and lower extremities Tone- MAS 1 Left pec   Musculoskeletal: no pain with mild finger and wrist ROM on left         Assessment/Plan: 1. Functional deficits which require 3+ hours per day of interdisciplinary therapy in a comprehensive inpatient rehab setting. Physiatrist is providing close team supervision and 24 hour management of active medical problems listed below. Physiatrist and rehab team continue to assess barriers to discharge/monitor patient progress toward functional and medical goals  Care Tool:  Bathing    Body parts bathed by patient: Left arm, Chest, Abdomen, Front perineal area, Buttocks, Right upper leg, Left upper leg, Right lower leg, Left lower leg, Face   Body parts bathed by helper: Right arm     Bathing assist Assist Level: Minimal Assistance - Patient > 75%     Upper Body Dressing/Undressing Upper body dressing   What is the patient wearing?: Pull over shirt    Upper body assist Assist Level: Contact Guard/Touching assist    Lower Body Dressing/Undressing Lower body dressing      What is the patient wearing?: Pants     Lower body assist Assist for lower body dressing: Minimal  Assistance - Patient > 75%     Chartered loss adjuster assist Assist for toileting: Maximal Assistance - Patient 25 - 49% Assistive Device Comment: bedpan   Transfers Chair/bed transfer  Transfers assist  Chair/bed transfer activity did not occur: Safety/medical concerns  Chair/bed transfer assist level: Minimal Assistance - Patient > 75% (squat pivot to R side)     Locomotion Ambulation   Ambulation assist   Ambulation activity did not occur: Safety/medical concerns  Assist level: Moderate Assistance - Patient 50 - 74% Assistive device: Walker-rolling Max distance: 4f    Walk 10 feet activity   Assist  Walk 10 feet activity did not occur: Safety/medical concerns  Assist level: Moderate Assistance - Patient - 50 - 74% Assistive device: Walker-rolling   Walk 50 feet activity   Assist Walk 50 feet with 2 turns activity did not occur: Safety/medical concerns  Assist level: Moderate Assistance - Patient - 50 - 74% Assistive device: Walker-rolling    Walk 150 feet activity   Assist Walk 150 feet activity did not occur: Safety/medical concerns         Walk 10 feet on uneven surface  activity   Assist Walk 10 feet on uneven surfaces activity did not occur: Safety/medical concerns         Wheelchair     Assist Is the patient using a wheelchair?: Yes Type of Wheelchair: Manual    Wheelchair assist level: Dependent - Patient 0%      Wheelchair 50 feet with 2 turns activity    Assist        Assist Level: Dependent - Patient 0%   Wheelchair 150 feet activity     Assist      Assist Level: Dependent - Patient 0%   Blood pressure 118/71, pulse 70, temperature 97.6 F (36.4 C), temperature source Oral, resp. rate 15, height '5\' 10"'$  (1.778 m), weight 79.3 kg, SpO2 95 %.  Assessment and plan: 1. 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  Antithrombotics: -DVT/anticoagulation:  Pharmaceutical: Lovenox             -antiplatelet therapy: Aspirin 81 mg daily and Plavix 75 mg day x3 weeks (end 9/22) then aspirin alone 3. Pain Management: Tylenol as needed 4. Mood/Behavior/Sleep: Provide emotional support             -antipsychotic agents: N/A             - 9/17 - inc trazodone form 50 to 75 mg qhs --sleep improved 5. Neuropsych/cognition: This patient is capable of making decisions on his own behalf. 6. Skin/Wound Care:  Routine skin checks 7. Fluids/Electrolytes/Nutrition: Routine in and outs with follow-up chemistries Discussed with patient importance of fluid intake to prevent dehydration, Will ask nursing to encourage fluids, recheck BMET 9/25 8.  Permissive hypertension.  Decrease Losartan to '25mg'$ , continue this dose             9/15- BP controlled- cont' regimen             9/23 bp well controlled    08/14/2022    8:10 AM 08/14/2022    5:26 AM 08/13/2022  8:05 PM  Vitals with BMI  Systolic 076 226 333  Diastolic 71 64 70  Pulse 70 51 65      9.  Hyperlipidemia.  continue Crestor  10.  COnstipation add sennaS 2 po BID              -Order sorbitol             9/15- LBM 9/13- if no BM by tomorrow- will need sorbitol again.              9/21--had BM   11.  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  Left wrist pain suspect some aggravation of underlying OA- see xray result no acute injury, will get wrist splint (instead of resting splint )  voltaren gel  LOS: 21 days A FACE TO FACE EVALUATION WAS PERFORMED  Charlett Blake 08/14/2022, 9:39 AM

## 2022-08-15 ENCOUNTER — Other Ambulatory Visit (HOSPITAL_COMMUNITY): Payer: Self-pay

## 2022-08-15 NOTE — Progress Notes (Signed)
Inpatient Rehabilitation Discharge Medication Review by a Pharmacist  A complete drug regimen review was completed for this patient to identify any potential clinically significant medication issues.  High Risk Drug Classes Is patient taking? Indication by Medication  Antipsychotic No   Anticoagulant No   Antibiotic No   Opioid No   Antiplatelet Yes ASA - stroke  Hypoglycemics/insulin No   Vasoactive Medication Yes Losartan - HTN  Chemotherapy No   Other Yes Crestor - HLD Trazodone - sleep     Type of Medication Issue Identified Description of Issue Recommendation(s)  Drug Interaction(s) (clinically significant)     Duplicate Therapy     Allergy     No Medication Administration End Date     Incorrect Dose     Additional Drug Therapy Needed     Significant med changes from prior encounter (inform family/care partners about these prior to discharge).    Other       Clinically significant medication issues were identified that warrant physician communication and completion of prescribed/recommended actions by midnight of the next day:  No  Name of provider notified for urgent issues identified:   Provider Method of Notification:   Pharmacist comments:   Time spent performing this drug regimen review (minutes):  [place], [place] Clinical Pharmacist  08/15/2022 8:24 AM

## 2022-08-15 NOTE — Progress Notes (Signed)
PROGRESS NOTE   Subjective/Complaints:   No new issues, some finger swelling last noc, improved with loosening of wrist splint   ROS: Patient denies CP, SOB, N/V/D    Objective:   DG Wrist Complete Left  Result Date: 08/13/2022 CLINICAL DATA:  Pain EXAM: LEFT WRIST - COMPLETE 3+ VIEW COMPARISON:  None Available. FINDINGS: No recent fracture or dislocation is seen. There are subcortical cysts in distal ulna, lunate and scaphoid, possibly due to degenerative changes. Degenerative changes are noted in first carpometacarpal joint with joint space narrowing and bony spurs. Subcortical cysts are seen in the head and base of first metacarpal, possibly due to degenerative changes. Small bony spurs seen in first metacarpophalangeal joint. IMPRESSION: No recent fracture or dislocation is seen in left wrist. Degenerative changes in multiple joints as described in the body of the report. Electronically Signed   By: Elmer Picker M.D.   On: 08/13/2022 13:37   No results for input(s): "WBC", "HGB", "HCT", "PLT" in the last 72 hours. No results for input(s): "NA", "K", "CL", "CO2", "GLUCOSE", "BUN", "CREATININE", "CALCIUM" in the last 72 hours.  Intake/Output Summary (Last 24 hours) at 08/15/2022 0824 Last data filed at 08/15/2022 0726 Gross per 24 hour  Intake 594 ml  Output 750 ml  Net -156 ml         Physical Exam: Vital Signs reviewed as above.    General: No acute distress Mood and affect are appropriate Heart: Regular rate and rhythm no rubs murmurs or extra sounds Lungs: Clear to auscultation, breathing unlabored, no rales or wheezes Abdomen: Positive bowel sounds, soft nontender to palpation, nondistended Extremities: No clubbing, cyanosis, or edema Skin: No evidence of breakdown, no evidence of rash   Neurologic: Cranial nerves II through XII intact, motor strength is 5/5 in RIght deltoid, bicep, tricep, grip, hip  flexor, knee extensors, ankle dorsiflexor and plantar flexor 2- triceps o/w 0/5 LUE, Sensory exam normal sensation to light touch and proprioception in bilateral upper and lower extremities Tone- MAS 1 Left pec   Musculoskeletal: pain with endrange L wrist flexion and ext limited ROM , no joint effusion         Assessment/Plan: 1. 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 Stable for D/C today F/u PCP in 3-4 weeks F/u PM&R 2 weeks Neuro in 1-2 mo  See D/C summary See D/C instructions   Care Tool:  Bathing    Body parts bathed by patient: Left arm, Chest, Abdomen, Front perineal area, Buttocks, Right upper leg, Left upper leg, Right lower leg, Left lower leg, Face   Body parts bathed by helper: Right arm     Bathing assist Assist Level: Minimal Assistance - Patient > 75%     Upper Body Dressing/Undressing Upper body dressing   What is the patient wearing?: Pull over shirt    Upper body assist Assist Level: Contact Guard/Touching assist    Lower Body Dressing/Undressing Lower body dressing      What is the patient wearing?: Pants     Lower body assist Assist for lower body dressing: Minimal Assistance - Patient > 75%     Toileting Toileting    Toileting assist Assist for  toileting: Maximal Assistance - Patient 25 - 49% Assistive Device Comment: bedpan   Transfers Chair/bed transfer  Transfers assist  Chair/bed transfer activity did not occur: Safety/medical concerns  Chair/bed transfer assist level: Minimal Assistance - Patient > 75% Chair/bed transfer assistive device: Armrests   Locomotion Ambulation   Ambulation assist   Ambulation activity did not occur: Safety/medical concerns  Assist level: Moderate Assistance - Patient 50 - 74% Assistive device: Walker-hemi Max distance: 81f   Walk 10 feet activity   Assist  Walk 10 feet activity did not occur: Safety/medical concerns  Assist level: Moderate Assistance - Patient - 50 -  74% Assistive device: Walker-hemi   Walk 50 feet activity   Assist Walk 50 feet with 2 turns activity did not occur: Safety/medical concerns  Assist level: Moderate Assistance - Patient - 50 - 74% Assistive device: Walker-hemi    Walk 150 feet activity   Assist Walk 150 feet activity did not occur: Safety/medical concerns         Walk 10 feet on uneven surface  activity   Assist Walk 10 feet on uneven surfaces activity did not occur: Safety/medical concerns         Wheelchair     Assist Is the patient using a wheelchair?: Yes Type of Wheelchair: Manual    Wheelchair assist level: Supervision/Verbal cueing Max wheelchair distance: 1548f   Wheelchair 50 feet with 2 turns activity    Assist        Assist Level: Supervision/Verbal cueing   Wheelchair 150 feet activity     Assist      Assist Level: Supervision/Verbal cueing   Blood pressure (!) 97/50, pulse (!) 54, temperature 98.3 F (36.8 C), temperature source Oral, resp. rate 17, height '5\' 10"'$  (1.778 m), weight 79.3 kg, SpO2 100 %.  Assessment and plan: 1. 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  Antithrombotics: -DVT/anticoagulation:  Pharmaceutical: Lovenox             -antiplatelet therapy: Aspirin 81 mg daily and Plavix 75 mg day x3 weeks (end 9/22) then aspirin alone 3. Pain Management: Tylenol as needed 4. Mood/Behavior/Sleep: Provide emotional support             -antipsychotic agents: N/A             - 9/17 - inc trazodone form 50 to 75 mg qhs --sleep improved 5. Neuropsych/cognition: This patient is capable of making decisions on his own behalf. 6. Skin/Wound Care: Routine skin checks 7. Fluids/Electrolytes/Nutrition: Routine in and outs with follow-up chemistries Discussed with patient importance of fluid intake to prevent  dehydration, Will ask nursing to encourage fluids, recheck BMET 9/25 8.  Permissive hypertension.  Decrease Losartan to '25mg'$ , continue this dose             9/15- BP controlled- cont' regimen             9/23 bp well controlled    08/15/2022    3:37 AM 08/14/2022    7:59 PM 08/14/2022    1:04 PM  Vitals with BMI  Systolic 97 [id number] 50 61 69  Pulse 54 70 67   Systolic a bit low this am but asymptomatic and has been well controlled  9.  Hyperlipidemia.  continue Crestor  10.  COnstipation add sennaS 2 po BID              -Order sorbitol             9/15- LBM 9/13- if no BM by tomorrow- will need sorbitol again.              9/21--had BM   11.  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  Left wrist pain suspect some aggravation of underlying OA- see xray result no acute injury, will get wrist splint (instead of resting splint )  voltaren gel  LOS: 22 days A FACE TO FACE EVALUATION WAS PERFORMED  Charlett Blake 08/15/2022, 8:24 AM

## 2022-08-15 NOTE — Progress Notes (Signed)
Recreational Therapy Discharge Summary Patient Details  Name: Daniel [name] MRN: [id number] Date of Birth: July 26, 1946 Today's Date: 08/15/2022  Comments on progress toward goals: Pt discharged home today at supervision w/c level.  TR sessions focused on activity analysis/modifications, leisure education, coping, pet therapy and dynamic balance.  Pt is hopeful to return to previously enjoyed leisure activities in the future.  Reasons for discharge: discharge from hospital  Follow-up: [place] agrees with progress made and goals achieved: Yes  Avier Jech 08/15/2022, 11:47 AM

## 2022-08-15 NOTE — Progress Notes (Signed)
Patient discharged accompanied by NT and family. All DME provided, PA discharge instructions completed

## 2022-08-16 ENCOUNTER — Telehealth: Payer: Self-pay

## 2022-08-16 DIAGNOSIS — Z9181 History of falling: Secondary | ICD-10-CM | POA: Diagnosis not present

## 2022-08-16 DIAGNOSIS — I6381 Other cerebral infarction due to occlusion or stenosis of small artery: Secondary | ICD-10-CM | POA: Diagnosis not present

## 2022-08-16 DIAGNOSIS — I69320 Aphasia following cerebral infarction: Secondary | ICD-10-CM | POA: Diagnosis not present

## 2022-08-16 DIAGNOSIS — I69322 Dysarthria following cerebral infarction: Secondary | ICD-10-CM | POA: Diagnosis not present

## 2022-08-16 DIAGNOSIS — K5900 Constipation, unspecified: Secondary | ICD-10-CM | POA: Diagnosis not present

## 2022-08-16 DIAGNOSIS — I69354 Hemiplegia and hemiparesis following cerebral infarction affecting left non-dominant side: Secondary | ICD-10-CM | POA: Diagnosis not present

## 2022-08-16 DIAGNOSIS — Z87891 Personal history of nicotine dependence: Secondary | ICD-10-CM | POA: Diagnosis not present

## 2022-08-16 DIAGNOSIS — M1990 Unspecified osteoarthritis, unspecified site: Secondary | ICD-10-CM | POA: Diagnosis not present

## 2022-08-16 DIAGNOSIS — Z7902 Long term (current) use of antithrombotics/antiplatelets: Secondary | ICD-10-CM | POA: Diagnosis not present

## 2022-08-16 DIAGNOSIS — E785 Hyperlipidemia, unspecified: Secondary | ICD-10-CM | POA: Diagnosis not present

## 2022-08-16 DIAGNOSIS — I10 Essential (primary) hypertension: Secondary | ICD-10-CM | POA: Diagnosis not present

## 2022-08-16 DIAGNOSIS — Z7982 Long term (current) use of aspirin: Secondary | ICD-10-CM | POA: Diagnosis not present

## 2022-08-16 NOTE — Patient Outreach (Signed)
  Care Coordination [facility] [place] Note Transition Care Management Follow-up Telephone Call Date of discharge and from where: [facility] 07/24/[facility]-08[facility]15/22 How have you been since you were released from the hospital? Lengthy conversation with patients daughter, Levada Dy, and she said patient is doing very well.  Patient is using his wheelchair to get around.   Any questions or concerns? No  Items Reviewed: Did the pt receive and understand the discharge instructions provided? Yes  Medications obtained and verified? Yes - Patients daughter had concern about her dad taking Trazadone for sleep as she found out it is an antidepressant.  She said she called the hospital multiple times and could not get anyone to discuss with her. Provided Levada Dy this RNCM phone number if she has further questions. Other? No  Any new allergies since your discharge? No  Dietary orders reviewed? Yes Do you have support at home? Yes   Home Care and Equipment/Supplies: Were home health services ordered? yes If so, what is the name of the agency? [facility]  Has the agency set up a time to come to the patient's home? yes Were any new equipment or medical supplies ordered?  Yes: wheelchair, walker, commode What is the name of the medical supply agency? [employer] Were you able to get the supplies/equipment? yes Do you have any questions related to the use of the equipment or supplies? No  Functional Questionnaire: (I = Independent and D = Dependent) ADLs: D  Bathing/Dressing- D  Meal Prep- D  Eating- I  Maintaining continence- D  Transferring/Ambulation- D  Managing Meds- D  Follow up appointments reviewed:  PCP Hospital f/u appt confirmed? Yes  Scheduled to see Maryjean Ka, NP on 09/13/22 @ 10:15. [facility] f/u appt confirmed? Yes  Scheduled to see Dr. Marcello Moores, Neurology on 08/27/22 @ 11:00. Are transportation arrangements needed? No  If their condition worsens, is the pt aware to call PCP or go to the  Emergency Dept.? Yes Was the patient provided with contact information for the PCP's office or ED? Yes Was to pt encouraged to call back with questions or concerns? Yes  SDOH assessments and interventions completed:   Yes  Care Coordination Interventions Activated:  Yes   Care Coordination Interventions:  No Care Coordination interventions needed at this time.   Encounter Outcome:  Pt. Visit Completed

## 2022-08-16 NOTE — Progress Notes (Signed)
Occupational Therapy Discharge Summary  Patient Details  Name: Daniel [name] MRN: [id number] Date of Birth: 1946/01/08  Date of Discharge from OT service:August 15, 2022  Today's Date: 08/16/2022  Patient has met 9 of 14 long term goals due to improved activity tolerance, improved balance, postural control, ability to compensate for deficits, improved attention, improved awareness, and improved coordination.  Patient to discharge at [facility] Assist - moderate assist level.  [name] care partner is independent to provide the necessary physical and cognitive assistance at discharge.    Reasons goals not met: Pt fluctuates on needing minimal - moderate assist for dynamic standing balance and assistance for clothing management to ensure standing balance during ADLs  Recommendation:  Patient will benefit from ongoing skilled OT services in home health setting to continue to advance functional skills in the area of BADL and Reduce care partner burden.  Equipment: Tub-transfer bench and BSC  Reasons for discharge: discharge from hospital  Patient/family agrees with progress made and goals achieved: Yes  OT Discharge    ADL ADL Grooming: Supervision/safety Where Assessed-Grooming: Wheelchair, Sitting at sink Upper Body Bathing: Supervision/safety Where Assessed-Upper Body Bathing: Shower Lower Body Bathing: Minimal assistance Where Assessed-Lower Body Bathing: Shower Upper Body Dressing: Contact guard Where Assessed-Upper Body Dressing: Wheelchair Lower Body Dressing: Minimal assistance Where Assessed-Lower Body Dressing: Wheelchair Toileting: Minimal assistance Where Assessed-Toileting: Glass blower/designer: Psychiatric nurse Method: Arts development officer: Drop arm bedside commode Tub/Shower Transfer: Minimal Museum/gallery conservator Method: Stand pivot Tub/Shower Equipment: Facilities manager: Moderate  assistance, Minimal cueing (Pt able to perform SPT from w/c <> BSC in the shower with moderate assistance for SPT to the L and minimal assist for SPT to the R back to w/c with use of grab bars.) Social research officer, government Method: Stand pivot Youth worker: Grab bars (BSC in the shower) ADL Comments: Pt able to perform supine > sit at EOB transfer with minimal assistance to fully scoot hips to EOB. Pt able to perform SPT to the R from EOB > w/c with minimal assistance and blocking on LLE. Pt then able to perform bathing with CGA and UB dressing with supervision. Pt requires moderate - minimal assistance for SPT from w/c <> BSC in the shower with increased assist needed when pivoting to L side. Pt requires maximal assistance for LB dressing. Education provided to pt on need for family training and importance of pt managing LUE during ADLs to improve neuro-motor control. Vision Baseline Vision/History: 1 Wears glasses Patient Visual Report: No change from baseline Vision Assessment?: No apparent visual deficits Perception  Perception: Within Functional Limits Inattention/Neglect: Appears intact Praxis Praxis: Impaired Praxis Impairment Details: Motor planning Cognition Cognition Overall Cognitive Status: Within Functional Limits for tasks assessed Arousal/Alertness: Awake/alert Orientation Level: Person;Place;Situation Memory: Appears intact Memory Impairment: Retrieval deficit;Decreased short term memory Attention: Focused;Sustained;Selective Focused Attention: Appears intact Sustained Attention: Appears intact Selective Attention: Impaired Problem Solving: Impaired Safety/Judgment: Appears intact Brief Interview for Mental Status (BIMS) Repetition of Three Words (First Attempt): 3 Temporal Orientation: Year: Correct Temporal Orientation: Month: Accurate within 5 days Temporal Orientation: Day: Correct Recall: "Sock": Yes, no cue required Recall: "Blue": Yes, no cue  required Recall: "Bed": Yes, no cue required BIMS Summary Score: 15 Sensation Sensation Light Touch: Impaired Detail Central sensation comments: pt reports numbness and tingling in L hemibody but is able to feel light touch in all points on L LE during assessment Hot/Cold: Not tested Proprioception: Impaired Detail Proprioception  Impaired Details: Impaired LLE;Impaired LUE Stereognosis: Not tested Coordination Gross Motor Movements are Fluid and Coordinated: No Fine Motor Movements are Fluid and Coordinated: No Coordination and Movement Description: impaired due to dense L hemiplegia (UE more impaired than LE) with impaired core/trunk strength 9 Hole Peg Test: Dense L hemiplegia Motor  Motor Motor: Hemiplegia;Abnormal tone;Abnormal postural alignment and control Motor - Skilled Clinical Observations: dense L hemiplegia (UE more impaired than LE), hypotonia in L LE and flaccidity in L UE Mobility  Bed Mobility Bed Mobility: Supine to Sit;Sit to Supine;Rolling Right Rolling Right: Minimal Assistance - Patient > 75% Supine to Sit: Minimal Assistance - Patient > 75% Sit to Supine: Minimal Assistance - Patient > 75% Transfers Sit to Stand: Contact Guard/Touching assist Stand to Sit: Contact Guard/Touching assist  Trunk/Postural Assessment  Cervical Assessment Cervical Assessment: Within Functional Limits Thoracic Assessment Thoracic Assessment: Within Functional Limits Lumbar Assessment Lumbar Assessment: Exceptions to [facility] (posterior pelvic tilt in sitting) Postural Control Postural Control: Deficits on evaluation  Balance Static Sitting Balance Static Sitting - Balance Support: Feet supported;Right upper extremity supported Static Sitting - Level of Assistance: 5: Stand by assistance Dynamic Sitting Balance Dynamic Sitting - Balance Support: Feet supported;Right upper extremity supported Dynamic Sitting - Level of Assistance: 5: Stand by assistance Dynamic Sitting - Balance  Activities: Lateral lean/weight shifting;Forward lean/weight shifting;Reaching for objects Sitting balance - Comments: posterior lean Static Standing Balance Static Standing - Balance Support: No upper extremity supported Static Standing - Level of Assistance: 4: Min assist Dynamic Standing Balance Dynamic Standing - Balance Support: During functional activity;Right upper extremity supported Dynamic Standing - Level of Assistance: 4: Min assist Dynamic Standing - Balance Activities: Reaching for objects;Forward lean/weight shifting Extremity/Trunk Assessment RUE Assessment RUE Assessment: Within Functional Limits LUE Assessment LUE Assessment: Exceptions to [facility] Strength Comments: getting some movement back in gravity eliminate position at proximal shoulder and elbow LUE Body System: Neuro Brunstrum levels for arm and hand: Arm;Hand Brunstrum level for arm: Stage II Synergy is developing Brunstrum level for hand: Stage II Synergy is developing   Precious Haws 08/16/2022, 4:25 PM  &   Barbee Shropshire, OTR/L [phone], 17:38

## 2022-08-17 ENCOUNTER — Telehealth: Payer: Self-pay

## 2022-08-17 NOTE — Telephone Encounter (Signed)
[name] SLP calls nurse line requesting verbal orders for speech as follows.   1 week 1  2 week 2 1 week 1   VM left on confidential VM giving verbal orders.

## 2022-08-24 DIAGNOSIS — I639 Cerebral infarction, unspecified: Secondary | ICD-10-CM | POA: Diagnosis not present

## 2022-08-25 ENCOUNTER — Encounter (HOSPITAL_COMMUNITY): Payer: Self-pay | Admitting: Emergency Medicine

## 2022-08-25 ENCOUNTER — Emergency Department (HOSPITAL_COMMUNITY): Payer: Medicare HMO

## 2022-08-25 ENCOUNTER — Other Ambulatory Visit: Payer: Self-pay

## 2022-08-25 ENCOUNTER — Emergency Department (HOSPITAL_COMMUNITY)
Admission: EM | Admit: 2022-08-25 | Discharge: 2022-08-25 | Disposition: A | Discharge status: Home / Self Care | Payer: Medicare HMO | Attending: Emergency Medicine | Admitting: Emergency Medicine

## 2022-08-25 DIAGNOSIS — J984 Other disorders of lung: Secondary | ICD-10-CM | POA: Diagnosis not present

## 2022-08-25 DIAGNOSIS — Z87891 Personal history of nicotine dependence: Secondary | ICD-10-CM | POA: Diagnosis not present

## 2022-08-25 DIAGNOSIS — I499 Cardiac arrhythmia, unspecified: Secondary | ICD-10-CM | POA: Diagnosis not present

## 2022-08-25 DIAGNOSIS — Z743 Need for continuous supervision: Secondary | ICD-10-CM | POA: Diagnosis not present

## 2022-08-25 DIAGNOSIS — R29810 Facial weakness: Secondary | ICD-10-CM | POA: Diagnosis not present

## 2022-08-25 DIAGNOSIS — R918 Other nonspecific abnormal finding of lung field: Secondary | ICD-10-CM | POA: Diagnosis not present

## 2022-08-25 DIAGNOSIS — I10 Essential (primary) hypertension: Secondary | ICD-10-CM | POA: Insufficient documentation

## 2022-08-25 DIAGNOSIS — R911 Solitary pulmonary nodule: Secondary | ICD-10-CM | POA: Diagnosis not present

## 2022-08-25 DIAGNOSIS — R001 Bradycardia, unspecified: Secondary | ICD-10-CM | POA: Diagnosis not present

## 2022-08-25 DIAGNOSIS — R0602 Shortness of breath: Secondary | ICD-10-CM | POA: Diagnosis not present

## 2022-08-25 LAB — D-DIMER, QUANTITATIVE: D-Dimer, Quant: 0.34 ug/mL-FEU (ref 0.00–0.50)

## 2022-08-25 LAB — BASIC METABOLIC PANEL
Anion gap: 11 (ref 5–15)
BUN: 15 mg/dL (ref 8–23)
CO2: 23 mmol/L (ref 22–32)
Calcium: 9 mg/dL (ref 8.9–10.3)
Chloride: 105 mmol/L (ref 98–111)
Creatinine, Ser: 1.03 mg/dL (ref 0.61–1.24)
GFR, Estimated: >60 mL/min (ref >60)
Glucose, Bld: 103 mg/dL — ABNORMAL HIGH (ref 70–99)
Potassium: 4.2 mmol/L (ref 3.5–5.1)
Sodium: 139 mmol/L (ref 135–145)

## 2022-08-25 LAB — CBC
HCT: 36 % — ABNORMAL LOW (ref 39.0–52.0)
Hemoglobin: 11.7 g/dL — ABNORMAL LOW (ref 13.0–17.0)
MCH: 28.6 pg (ref 26.0–34.0)
MCHC: 32.5 g/dL (ref 30.0–36.0)
MCV: 88 fL (ref 80.0–100.0)
Platelets: 134 10*3/uL — ABNORMAL LOW (ref 150–400)
RBC: 4.09 MIL/uL — ABNORMAL LOW (ref 4.22–5.81)
RDW: 13 % (ref 11.5–15.5)
WBC: 6.6 10*3/uL (ref 4.0–10.5)
nRBC: 0 % (ref 0.0–0.2)

## 2022-08-25 LAB — TROPONIN I (HIGH SENSITIVITY)
Troponin I (High Sensitivity): 5 ng/L (ref <18)
Troponin I (High Sensitivity): 6 ng/L (ref <18)

## 2022-08-25 LAB — BRAIN NATRIURETIC PEPTIDE: B Natriuretic Peptide: 42 pg/mL (ref 0.0–100.0)

## 2022-08-25 NOTE — ED Triage Notes (Signed)
Pt brought to ED with c/o shortness of breath that began around 3 am. EMS reports pt able to maintain saturation of 98% on RA. Recent CVA approximately one month ago with left side residual.    EMS Vitals BP 146/82 HR 56 SPO2 98% CBG 117

## 2022-08-25 NOTE — ED Provider Notes (Signed)
  Physical Exam  BP 137/81   Pulse 68   Temp 98 F (36.7 C) (Oral)   Resp (!) 26   SpO2 97%   Physical Exam  Procedures  Procedures  ED Course / MDM   Clinical Course as of 08/25/22 0931  Sat Aug 25, 2022  0708 Assumed care from Dr Sedonia Small. 76 yo M with hx of stroke 1 month L sided deficits with facial droop ago who presented with SOB that spontaneously resolved. Dimer negative. First troponin 5 and awaiting second now.  [RP]  3953 Hemoglobin(!): 11.7 [RP]  0930 Upon my reevaluation patient is resting comfortably.  Denies any shortness of breath at this time.  His lungs are clear to auscultation bilaterally and he is breathing comfortably on room air.  Repeat troponin was WNL.  We will have the patient follow-up with his primary doctor on Monday for his symptoms.  Return precautions discussed prior to discharge. [RP]    Clinical Course User Index [RP] Fransico Meadow, MD   Medical Decision Making Amount and/or Complexity of Data Reviewed Labs: ordered. Decision-making details documented in ED Course. Radiology: ordered. ECG/medicine tests: ordered.      Fransico Meadow, MD 08/25/22 [phone]

## 2022-08-25 NOTE — ED Provider Notes (Signed)
[facility] Emergency Department Provider Note MRN:  [id number]  Arrival date & time: 08/25/22     Chief Complaint   Shortness of Breath   History of Present Illness   Daniel [name] is a 76 y.o. year-old male with a history of hypertension, stroke presenting to the ED with chief complaint of shortness of breath.  Sudden shortness of breath while trying to sleep this evening.  No chest pain, no recent fever or cough, no abdominal pain, no new numbness or weakness to the arms or legs.  Recently in the hospital for a stroke, still has some left-sided deficits.  Feels better now, no longer short of breath.  Review of Systems  A thorough review of systems was obtained and all systems are negative except as noted in the HPI and PMH.   [name] Health History    Past Medical History:  Diagnosis Date   Hernia of fascia 2008   repair with many complications    Hypertension     Past Surgical History:  Procedure Laterality Date   APPENDECTOMY     HERNIA REPAIR      Family History  Problem Relation Age of Onset   Hypertension Mother    Osteoporosis Mother    Thyroid disease Mother    Alcohol abuse Father    Depression Sister    Hypertension Sister    High Cholesterol Sister    Hypertension Brother     Social History   Socioeconomic History   Marital status: Married    Spouse name: Not on file   Number of children: Not on file   Years of education: Not on file   Highest education level: Not on file  Occupational History   Occupation: retired    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  Tobacco Use   Smoking status: Former   Smokeless tobacco: Never  Substance and Sexual Activity   Alcohol use: No   Drug use: No   Sexual activity: Yes  Other Topics Concern   Not on file  Social History Narrative   November 2017   Likes to fish and work around the house    Social Determinants of Radio broadcast assistant Strain: Not on file  Food  Insecurity: Not on file  Transportation Needs: No Transportation Needs (08/16/2022)   PRAPARE - Hydrologist (Medical): No    Lack of Transportation (Non-Medical): No  Physical Activity: Not on file  Stress: Not on file  Social Connections: Not on file  Intimate Partner Violence: Not on file     Physical Exam   Vitals:   08/25/22 0600 08/25/22 0630  BP: (!) 144/81 136/79  Pulse: (!) 53 (!) 56  Resp: 12 15  Temp:    SpO2: 99% 95%    CONSTITUTIONAL: Well-appearing, NAD NEURO/PSYCH:  Alert and oriented x 3, left sided weakness with facial droop EYES:  eyes equal and reactive ENT/NECK:  no LAD, no JVD CARDIO: Regular rate, well-perfused, normal S1 and S2 PULM:  CTAB no wheezing or rhonchi GI/GU:  non-distended, non-tender MSK/SPINE:  No gross deformities, no edema SKIN:  no rash, atraumatic   *Additional and/or pertinent findings included in MDM below  Diagnostic and Interventional Summary    EKG Interpretation  Date/Time:  Saturday August 25 2022 05:58:52 EDT Ventricular Rate:  53 PR Interval:  198 QRS Duration: 99 QT Interval:  448 QTC Calculation: 421 R Axis:   30 Text Interpretation: Sinus rhythm  Confirmed by Gerlene Fee [phone]) on 08/25/2022 6:17:53 AM       Labs Reviewed  CBC - Abnormal; Notable for the following components:      Result Value   RBC 4.09 (*)    Hemoglobin 11.7 (*)    HCT 36.0 (*)    Platelets 134 (*)    All other components within normal limits  BASIC METABOLIC PANEL - Abnormal; Notable for the following components:   Glucose, Bld 103 (*)    All other components within normal limits  D-DIMER, QUANTITATIVE  BRAIN NATRIURETIC PEPTIDE  TROPONIN I (HIGH SENSITIVITY)  TROPONIN I (HIGH SENSITIVITY)    DG Chest Port 1 View  Final Result      Medications - No data to display   Procedures  /  Critical Care Procedures  ED Course and Medical Decision Making  Initial Impression and Ddx Differential  diagnosis includes ACS, PE, patient does not appear fluid overloaded on exam, no fever or cough recently to suggest pneumonia, fairly brief symptoms and so could be related to anxiety or indigestion/GERD.  Labs, chest x-ray pending.  Past medical/surgical history that increases complexity of ED encounter: Stroke  Interpretation of Diagnostics I personally reviewed the EKG and my interpretation is as follows: Sinus rhythm  Labs reassuring with no significant blood count or electrolyte disturbance, first troponin negative, D-dimer negative  Patient Reassessment and Ultimate Disposition/Management     Awaiting second troponin, patient continues to be asymptomatic with normal vital signs, if second troponin negative would be appropriate for discharge.  Patient management required discussion with the following services or consulting groups:  None  Complexity of Problems Addressed Acute illness or injury that poses threat of life of bodily function  Additional Data Reviewed and Analyzed Further history obtained from: Recent discharge summary  Additional Factors Impacting ED Encounter Risk None  Barth Kirks. Sedonia Small, [place] [email or web address]  Final Clinical Impressions(s) / ED Diagnoses     ICD-10-CM   1. SOB (shortness of breath)  R06.02       ED Discharge Orders     None        Discharge Instructions Discussed with and Provided to Patient:   Discharge Instructions   None      Maudie Flakes, MD 08/25/22 [phone]

## 2022-08-25 NOTE — Discharge Instructions (Signed)
Today you were seen in the emergency department for your difficulty breathing.    In the emergency department you had lab work, EKG, and a chest x-ray that was reassuring.    Follow-up with your primary doctor in 2-3 days regarding your visit.  Your chest x-ray also showed a lung nodule that you will need to discuss with them as this can often times require repeat imaging and monitoring.  Return immediately to the emergency department if you experience any of the following: Chest pain, shortness of breath, cough, fever, or any other concerning symptoms.    Thank you for visiting our Emergency Department. It was a pleasure taking care of you today.

## 2022-08-27 ENCOUNTER — Encounter: Payer: Medicare HMO | Attending: Registered Nurse | Admitting: Registered Nurse

## 2022-08-27 ENCOUNTER — Encounter: Payer: Self-pay | Admitting: Registered Nurse

## 2022-08-27 VITALS — BP 127/81 | HR 77 | Wt 174.8 lb

## 2022-08-27 DIAGNOSIS — E7849 Other hyperlipidemia: Secondary | ICD-10-CM | POA: Insufficient documentation

## 2022-08-27 DIAGNOSIS — I10 Essential (primary) hypertension: Secondary | ICD-10-CM | POA: Insufficient documentation

## 2022-08-27 NOTE — Progress Notes (Signed)
Subjective:    Patient ID: Daniel [name], male    DOB: [birth year], 76 y.o.   MRN: [id number]  HPI: Daniel [name] is a 76 y.o. male who is here for [place] appointment for F/U of his  Cerebral Vascular Accident, Essential Hypertension and Hyperlipidemia. Mr. Holliman presented to [facility] on 07/19/2022 with left sided weakness an dizziness.  Dr. Nita Sells H&P on 07/19/2022. Assessment and Plan: Daniel [name] is a 76 y.o. 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 Neurology consulted in the ED. He is out of window for intervention but will be admitted for additional work up.    * CVA (cerebral vascular accident) [facility]) Presented out of window for intervention. Sx onset appears to be yesterday afternoon, per daughter. Neuro consulted and we appreciate their care and recommendations. Neurologic examination largely unremarkable. Will admit for further CVA work up. Risk stratifications so far notable for LDL 87, A1c 6.1%.  -Admit to progressive, FMTS, attending Dr. Ardelia Mems -Neuro following, appreciate care and recommendations -S/p ASA 325 mg, Plavix 300 mg x1 -Start DAPT with ASA 81 mg, Plavix 75 mg tomorrow -Heart healthy diet (passed bedside swallow) -PT/OT recommendations -Continuous cardiac monitoring -Echo -MRI brain w/o -MRA Head w/o -U/S Carotid  -Start high-intensity statin  -Allow for permissive HTN -Neuro checks q4h   CT Head: WO Contrast:  IMPRESSION: 1. Small focus of hypodensity in the right thalamic capsular region is age indeterminate but could reflect a small acute or subacute infarct. Consider brain MRI for further evaluation. 2. No evidence of large vessel territorial infarct or hemorrhage.  MRI/MRA  IMPRESSION: 1. Small acute infarct right centrum semiovale 2. 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 Motion degraded study of  the brain. 3. MRA is limited by vessel tortuosity and motion. No large vessel occlusion identified   Neurology was consulted and he was maintained on Aspirin and Plavix for CVA Prophylaxis. He was admitted to inpatient rehabilitation on 07/24/2022 and discharged home on 08/15/2022. He is receiving Home Health Therapy with [facility]. He states he has pain in his left arm. He rates his pain 5.  He states he uses the hemi walker with therapist only, he arrived in wheelchair.     Pain Inventory Average Pain 2 Pain Right Now 5 My pain is intermittent and aching  LOCATION OF PAIN  shoulder elbow wrist and buttocks  BOWEL Number of stools per week: 3x daily Oral laxative use No    BLADDER Normal   Mobility walk with assistance ability to climb steps?  no do you drive?  no use a wheelchair needs help with transfers  Function retired I need assistance with the following:  dressing, bathing, toileting, meal prep, household duties, and shopping  Neuro/Psych weakness numbness trouble walking depression  Prior Studies Any changes since last visit?  no  Physicians involved in your care Any changes since last visit?  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  Tobacco Use   Smoking status: Former   Smokeless tobacco: Never  Substance and Sexual Activity   Alcohol use: No   Drug use: No   Sexual activity: Yes  Other Topics Concern   Not on file  Social History Narrative    November 2017   Likes to fish and work around the house    Social Determinants of Radio broadcast assistant Strain: Not on file  Food Insecurity: Not on file  Transportation Needs: No Transportation Needs (08/16/2022)   PRAPARE - Hydrologist (Medical): No    Lack of Transportation (Non-Medical): No  Physical Activity: Not on file  Stress: Not on file  Social Connections: Not on file   Past Surgical History:  Procedure Laterality Date   APPENDECTOMY     HERNIA REPAIR     Past Medical History:  Diagnosis Date   Hernia of fascia 2008   repair with many complications    Hypertension    BP 127/81   Pulse 77   Wt 174 lb 13.3 oz (79.3 kg) Comment: last recorded  SpO2 96%   BMI 25.09 kg/m   Opioid Risk Score:   Fall Risk Score:  `1  Depression screen [facility] 2/9     08/27/2022   11:28 AM 07/19/2022    2:25 PM 07/11/2022   10:02 AM 10/24/2021   10:57 AM 05/18/2020   10:15 AM 01/06/2019   11:37 AM 09/04/2017    8:46 AM  Depression screen PHQ 2/9  Decreased Interest 0 0 0 0 0 0 0  Down, Depressed, Hopeless 0 0 0 0 0 0 0  PHQ - 2 Score 0 0 0 0 0 0 0  Altered sleeping '3 1 3 '$ 0     Tired, decreased energy 0 1 3 0     Change in appetite 0 0 0 0     Feeling bad or failure about yourself  3 0 0 0     Trouble concentrating 0 1 3 0     Moving slowly or fidgety/restless '3 1 3 '$ 0     Suicidal thoughts 0 0 0 0     PHQ-9 Score '9 4 12 '$ 0     Difficult doing work/chores Very difficult Somewhat difficult Somewhat difficult         Review of Systems  Constitutional: Negative.   HENT: Negative.    Eyes: Negative.   Respiratory: Negative.    Cardiovascular: Negative.   Gastrointestinal: Negative.   Endocrine: Negative.   Genitourinary: Negative.   Musculoskeletal:  Positive for gait problem.       Stroke side shoulder elbow and wrist  Skin: Negative.   Allergic/Immunologic: Negative.   Neurological:  Positive for weakness and numbness.  Hematological:  Negative.   Psychiatric/Behavioral:  Positive for dysphoric mood.   All other systems reviewed and are negative.      Objective:   Physical Exam Vitals and nursing note reviewed.  Constitutional:      Appearance: Normal appearance.  Cardiovascular:     Rate and Rhythm: Normal rate and regular rhythm.     Pulses: Normal pulses.     Heart sounds: Normal heart sounds.  Pulmonary:     Effort: Pulmonary effort is normal.     Breath sounds: Normal breath  sounds.  Musculoskeletal:     Cervical back: Normal range of motion and neck supple.     Comments: Normal Muscle Bulk and Muscle Testing Reveals:  Upper Extremities: Right: Full ROM and Muscle Strength 5/5 Left Upper Extremity: Paralysis   Lower Extremities: Right: Full ROM and Muscle Strength 5/5 Left Lower Extremity: Decreased ROM and Muscle Strength 4/5  Wearing AFO Arrived in wheelchair     Skin:    General: Skin is warm and dry.  Neurological:     Mental Status: He is alert and oriented to person, place, and time.  Psychiatric:        Mood and Affect: Mood normal.        Behavior: Behavior normal.           Assessment & Plan:  Cerebral Vascular Accident: Continue current medication regimen. Has a scheduled appointment with Neurology.  2.  Essential Hypertension: Continue current medication regimen. PCP Following. Continue to monitor.  3. Hyperlipidemia: Continue current Medication regimen. PCP Following. Continue to Monitor.   F/U with Dr Letta Pate in 4- 6 weeks

## 2022-08-30 ENCOUNTER — Telehealth: Payer: Self-pay

## 2022-08-30 NOTE — Telephone Encounter (Signed)
     Patient  visit on 10/7  at [facility]   Have you been able to follow up with your primary care physician? YES  The patient was or was not able to obtain any needed medicine or equipment. YES  Are there diet recommendations that you are having difficulty following? NA  Patient expresses understanding of discharge instructions and education provided has no other needs at this time.  [place], [facility], Care Management  [phone] [address], [place], [place] [address] Phone: [phone] Email: [email or web address]

## 2022-09-03 ENCOUNTER — Other Ambulatory Visit: Payer: Self-pay | Admitting: *Deleted

## 2022-09-03 MED ORDER — ROSUVASTATIN CALCIUM 20 MG PO TABS: 20.0000 mg | ORAL_TABLET | Freq: Every day | ORAL | 2 refills | Status: DC

## 2022-09-03 NOTE — Progress Notes (Unsigned)
Patient: Daniel [name] Date of Birth: [birth year]  Reason for Visit: Follow up History from: Patient, daughter  Primary Neurologist: Saw Dr. Erlinda Hong and Dr. Leonie Man  ASSESSMENT AND PLAN 76 y.o. year old male   1.  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  Hypertension -BP goal less than 130/90 -At goal, on losartan  3.  Hyperlipidemia -LDL 87, goal less than 70 -On rosuvastatin 20  Daniel [name] is delightful, I encouraged him to continue his home therapies, he is improving.  He will keep follow up with Dr. Letta Pate next month, he was given botox for left UE spasticity in the hospital.  Continue close follow-up with PCP for management of vascular risk factors.  I will see him back in 6 months or sooner if needed.  HISTORY OF PRESENT ILLNESS: Today 09/04/22 Daniel [name] is here today for stroke clinic follow-up, presented 07/19/2022 to the ER with left-sided weakness, ataxia, lightheadedness. He went to his PCP first then was sent to the ER.  Found to have right corona radiata infarct.  He had left upper and lower extremity weakness. He went to CIR, discharged home 08/15/22.   Here today with his daughter, Glenard Haring. He lives with her. He is doing PT/OT/ST at home, since D/C. Still has weakness to left side, he can stand on the left leg but gives out after prolonged. Wearing sling to left arm, keeps out of the way when transferring. Essentially no movement of left arm. Speech is softer. Eating well. Using potty chair, is able to stand with transfers. 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 He cannot use walker yet, only stand and transfer. Has a wheelchair. Wearing gait belt. Has had some  pain to left wrist, wearing brace, arthritis.  He remains on aspirin 81 mg.  -CT head no acute infarct.  There was small vessel disease. -MRI small acute infarct in the right centrum semiovale -Repeat MRI of the brain 9/4 with some extension of previous stroke -Carotid Doppler was unremarkable -MRA showed no LVO -EF 55 to 60% -LDL 87 -A1c 6.1 -No antithrombotic prior to admission, 3 weeks aspirin 81 mg daily and Plavix 75 mg daily, then aspirin alone  HISTORY  07/19/22 Dr. Lorrin Goodell HPI: Daniel [name] is a 76 y.o. male with a past medical history of HTN presenting with left lower extremity weakness and dizziness. He states that he "felt like a drunk man". He was in his usual state of health yesterday when he went to run some errands. He was at [facility] when he became dizzy. He saw his primary care provider today and was sent to the ED for a stroke work up after he was found to have a positive Romberg, severe HTN, left leg weakness, and dizziness in the clinic. He denies recent nausea, vomiting, chills, fever, chest pain, palpitations, or falls. He has never had headaches, stroke, or TIA symptoms in the past and has never seen a neurologist. He is compliant with his losartan and that is the only medication he is prescribed.    He was seen 07/11/2022 for HTN and nausea by his PCP as well. He stated at that visit that he had been working long hours outside  and thought he was just dehydrated. He had returned to baseline in between these two episodes.    LKW: 1400 07/18/2022 tpa given?: no, outside of window Premorbid modified Rankin scale (mRS): 0  REVIEW OF SYSTEMS: Out of a complete 14 system review of symptoms, the patient complains only of the following symptoms, and all other reviewed systems are negative.  See HPI  ALLERGIES: No Known Allergies  HOME MEDICATIONS: Outpatient Medications Prior to Visit  Medication Sig Dispense Refill   acetaminophen (TYLENOL) 325 MG tablet Take 2 tablets  (650 mg total) by mouth every 4 (four) hours as needed for mild pain (or temp > 37.5 C (99.5 F)).     aspirin EC 81 MG tablet Take 1 tablet (81 mg total) by mouth daily. Swallow whole. 30 tablet 12   losartan (COZAAR) 25 MG tablet Take 1 tablet (25 mg total) by mouth daily. 30 tablet 0   rosuvastatin (CRESTOR) 20 MG tablet Take 1 tablet (20 mg total) by mouth daily. 90 tablet 2   senna-docusate (SENOKOT-S) 8.6-50 MG tablet Take 1 tablet by mouth 2 (two) times daily.     traZODone (DESYREL) 150 MG tablet Take 0.5 tablets (75 mg total) by mouth at bedtime. 30 tablet 0   No facility-administered medications prior to visit.    PAST MEDICAL HISTORY: Past Medical History:  Diagnosis Date   Hernia of fascia 2008   repair with many complications    Hypertension     PAST SURGICAL HISTORY: Past Surgical History:  Procedure Laterality Date   APPENDECTOMY     HERNIA REPAIR      FAMILY HISTORY: Family History  Problem Relation Age of Onset   Hypertension Mother    Osteoporosis Mother    Thyroid disease Mother    Alcohol abuse Father    Depression Sister    Hypertension Sister    High Cholesterol Sister    Hypertension Brother     SOCIAL HISTORY: Social History   Socioeconomic History   Marital status: Married    Spouse name: Not on file   Number of children: Not on file   Years of education: Not on file   Highest education level: Not on file  Occupational History   Occupation: retired    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  Tobacco Use   Smoking status: Former   Smokeless tobacco: Never  Substance and Sexual Activity   Alcohol use: No   Drug use: No   Sexual activity: Yes  Other Topics Concern   Not on file  Social History Narrative   November 2017   Likes to fish and work around the house    Social Determinants of Radio broadcast assistant Strain: Not on file  Food Insecurity: Not on file  Transportation Needs: No Transportation Needs (08/16/2022)    PRAPARE - Hydrologist (Medical): No    Lack of Transportation (Non-Medical): No  Physical Activity: Not on file  Stress: Not on file  Social Connections: Not on file  Intimate Partner Violence: Not on file   PHYSICAL EXAM  Vitals:   09/04/22 1050  BP: 128/75  Pulse: 71   There is no height or weight on file to calculate BMI.  Generalized: Well developed, in no acute distress, in a wheelchair, wearing left arm sling Neurological examination  Mentation: Alert oriented to time, place, history taking. Follows all commands speech and language fluent, speech is soft but clear, very pleasant  Cranial  nerve II-XII: Pupils were equal round reactive to light. Extraocular movements were full, visual field were full on confrontational test.  Mild left-sided droop to mouth noted.decreased shoulder shrug on the left. Motor: Right upper and lower extremity is normal, when the left arm, no antigravity movement, able to wiggle fingers, not much extra tone, left leg is 3/5 hip flexion, knee flexion/extension. Has brace to left wrist, afo left leg Sensory: Sensory testing is intact to soft touch on all 4 extremities. No evidence of extinction is noted.  Coordination: Cerebellar testing reveals good finger-nose-finger with the right, cannot perform on the left; heel-to-shin with the right is normal, is hard to lift the left but he can slowly Gait and station: In a wheelchair, can stand from seated with assistance, was not ambulated Reflexes: Deep tendon reflexes are symmetric but increased on the left side  DIAGNOSTIC DATA (LABS, IMAGING, TESTING) - I reviewed patient records, labs, notes, testing and imaging myself where available.  Lab Results  Component Value Date   WBC 6.6 08/25/2022   HGB 11.7 (L) 08/25/2022   HCT 36.0 (L) 08/25/2022   MCV 88.0 08/25/2022   PLT 134 (L) 08/25/2022      Component Value Date/Time   NA 139 08/25/2022 0550   NA 137 07/11/2022 1044    K 4.2 08/25/2022 0550   CL 105 08/25/2022 0550   CO2 23 08/25/2022 0550   GLUCOSE 103 (H) 08/25/2022 0550   BUN 15 08/25/2022 0550   BUN 23 07/11/2022 1044   CREATININE 1.03 08/25/2022 0550   CREATININE 0.94 11/21/2016 1059   CALCIUM 9.0 08/25/2022 0550   PROT 6.5 07/25/2022 0651   PROT 6.4 07/11/2022 1044   ALBUMIN 3.5 07/25/2022 0651   ALBUMIN 4.2 07/11/2022 1044   AST 15 07/25/2022 0651   ALT 16 07/25/2022 0651   ALKPHOS 82 07/25/2022 0651   BILITOT 1.3 (H) 07/25/2022 0651   BILITOT 1.0 07/11/2022 1044   GFRNONAA >60 08/25/2022 0550   GFRAA 94 01/06/2019 1218   Lab Results  Component Value Date   CHOL 166 07/19/2022   HDL 65 07/19/2022   LDLCALC 87 07/19/2022   TRIG 70 07/19/2022   CHOLHDL 2.6 07/19/2022   Lab Results  Component Value Date   HGBA1C 6.1 (H) 07/19/2022   Lab Results  Component Value Date   VITAMINB12 329 01/06/2019   Lab Results  Component Value Date   TSH 2.060 01/06/2019   Butler Denmark, AGNP-C, DNP 09/04/2022, 12:36 PM [facility] [address] [place], [place] [address] [phone]

## 2022-09-04 ENCOUNTER — Encounter: Payer: Self-pay | Admitting: Neurology

## 2022-09-04 ENCOUNTER — Ambulatory Visit: Payer: Medicare HMO | Admitting: Neurology

## 2022-09-04 VITALS — BP 128/75 | HR 71

## 2022-09-04 DIAGNOSIS — I6381 Other cerebral infarction due to occlusion or stenosis of small artery: Secondary | ICD-10-CM | POA: Diagnosis not present

## 2022-09-04 DIAGNOSIS — I10 Essential (primary) hypertension: Secondary | ICD-10-CM | POA: Diagnosis not present

## 2022-09-04 NOTE — Patient Instructions (Addendum)
Great to meet you today! Continue to take aspirin 81 mg daily Keep BP < 130/90, LDL < 70, A1C < 7.0 Continue your home health rehab  I will see you back in 6 months

## 2022-09-05 ENCOUNTER — Telehealth: Payer: Self-pay

## 2022-09-05 NOTE — Telephone Encounter (Signed)
[name] from [place] calling for Speech Therapy verbal orders as follows:  1 time(s) weekly for 5 week(s).   Verbal orders given per [facility] protocol  Talbot Grumbling, RN

## 2022-09-08 NOTE — Progress Notes (Signed)
I agree with the above plan 

## 2022-09-12 DIAGNOSIS — I639 Cerebral infarction, unspecified: Secondary | ICD-10-CM | POA: Diagnosis not present

## 2022-09-13 ENCOUNTER — Inpatient Hospital Stay: Payer: Medicare HMO | Admitting: Neurology

## 2022-09-13 DIAGNOSIS — I639 Cerebral infarction, unspecified: Secondary | ICD-10-CM | POA: Diagnosis not present

## 2022-09-14 ENCOUNTER — Telehealth: Payer: Self-pay

## 2022-09-14 NOTE — Telephone Encounter (Signed)
[name] SLP calls nurse line reporting patient discharge for [facility] speech.  All goals met.

## 2022-09-28 ENCOUNTER — Ambulatory Visit: Payer: Medicare HMO | Admitting: Physical Medicine & Rehabilitation

## 2022-10-05 ENCOUNTER — Encounter: Payer: Medicare HMO | Attending: Registered Nurse | Admitting: Physical Medicine & Rehabilitation

## 2022-10-05 DIAGNOSIS — E7849 Other hyperlipidemia: Secondary | ICD-10-CM | POA: Insufficient documentation

## 2022-10-05 DIAGNOSIS — I10 Essential (primary) hypertension: Secondary | ICD-10-CM | POA: Insufficient documentation

## 2022-10-13 DIAGNOSIS — I639 Cerebral infarction, unspecified: Secondary | ICD-10-CM | POA: Diagnosis not present

## 2022-10-14 DIAGNOSIS — I639 Cerebral infarction, unspecified: Secondary | ICD-10-CM | POA: Diagnosis not present

## 2022-10-31 ENCOUNTER — Telehealth: Payer: Self-pay

## 2022-10-31 NOTE — Telephone Encounter (Signed)
Marzetta Board, PT with [phone] calls nurse line to clarify patient's medications. Patient was on Plavix after hospitalization in September. Plavix is still on med list with [facility]. PT wanted to clarify if patient is supposed to still be on Plavix or aspirin alone.   She is also requesting verbal orders for speech therapy eval. Verbal orders provided.   Talbot Grumbling, RN

## 2022-11-05 ENCOUNTER — Telehealth: Payer: Self-pay

## 2022-11-05 NOTE — Telephone Encounter (Signed)
[place] SLP calls nurse line for (3) reasons.   (1) [name] reports she did a home visit today and the patient complained of burning after eating. He also endorsed burning while lying down after eating over the weekend. [name] is requesting PCP opinion on starting an acid reducer.   (2) [name] is requesting an order for a Modified Barium Swallow.   (3) [name] is requesting verbal orders for speech as follows:  1x a week for 5 weeks   Verbal order given for speech therapy.   Will forward to PCP for other orders.

## 2022-11-07 NOTE — Telephone Encounter (Signed)
Spoke w [name]  She will call daughter and let her know he needs an appointment with me

## 2022-11-12 DIAGNOSIS — I639 Cerebral infarction, unspecified: Secondary | ICD-10-CM | POA: Diagnosis not present

## 2022-11-13 DIAGNOSIS — I639 Cerebral infarction, unspecified: Secondary | ICD-10-CM | POA: Diagnosis not present

## 2022-11-16 ENCOUNTER — Telehealth: Payer: Self-pay

## 2022-11-16 ENCOUNTER — Other Ambulatory Visit: Payer: Self-pay | Admitting: Family Medicine

## 2022-11-16 MED ORDER — NYSTATIN 100000 UNIT/ML MT SUSP: 5.0000 mL | Freq: Four times a day (QID) | OROMUCOSAL | 0 refills | Status: DC

## 2022-11-16 MED ORDER — OMEPRAZOLE 20 MG PO CPDR: 20.0000 mg | DELAYED_RELEASE_CAPSULE | Freq: Every day | ORAL | 3 refills | Status: DC

## 2022-11-16 NOTE — Telephone Encounter (Signed)
[place] SLP LVM on nurse line requesting mouth wash.   She reports she was in the home this morning and reports "classic symptoms" of thrush. She reports a thick white film on posterior of tongue.   She does report continued weight loss and has concerns for swallowing. She is requesting a PPI and Barium Swallow test.   Patient has an apt with PCP on 11/27/2022.  Will forward to PCP.

## 2022-11-16 NOTE — Telephone Encounter (Signed)
Called back but not available left message  Will send in Nysatin and omeprazole Will evaluate further at his office visit

## 2022-11-20 DIAGNOSIS — Z9181 History of falling: Secondary | ICD-10-CM | POA: Diagnosis not present

## 2022-11-20 DIAGNOSIS — I69322 Dysarthria following cerebral infarction: Secondary | ICD-10-CM | POA: Diagnosis not present

## 2022-11-20 DIAGNOSIS — E785 Hyperlipidemia, unspecified: Secondary | ICD-10-CM | POA: Diagnosis not present

## 2022-11-20 DIAGNOSIS — Z87891 Personal history of nicotine dependence: Secondary | ICD-10-CM | POA: Diagnosis not present

## 2022-11-20 DIAGNOSIS — Z7982 Long term (current) use of aspirin: Secondary | ICD-10-CM | POA: Diagnosis not present

## 2022-11-20 DIAGNOSIS — I10 Essential (primary) hypertension: Secondary | ICD-10-CM | POA: Diagnosis not present

## 2022-11-20 DIAGNOSIS — Z7902 Long term (current) use of antithrombotics/antiplatelets: Secondary | ICD-10-CM | POA: Diagnosis not present

## 2022-11-20 DIAGNOSIS — K5900 Constipation, unspecified: Secondary | ICD-10-CM | POA: Diagnosis not present

## 2022-11-20 DIAGNOSIS — I69354 Hemiplegia and hemiparesis following cerebral infarction affecting left non-dominant side: Secondary | ICD-10-CM | POA: Diagnosis not present

## 2022-11-20 DIAGNOSIS — M1990 Unspecified osteoarthritis, unspecified site: Secondary | ICD-10-CM | POA: Diagnosis not present

## 2022-11-21 NOTE — Telephone Encounter (Signed)
Covering for Dr. Rivka Barbara team, can you confirm with patient what dose of this medication he is taking? This request is for losartan '50mg'$  daily but his medication list currently says losartan '25mg'$  daily Which dose is he currently taking?  Thanks Leeanne Rio, MD

## 2022-11-23 DIAGNOSIS — Z7902 Long term (current) use of antithrombotics/antiplatelets: Secondary | ICD-10-CM | POA: Diagnosis not present

## 2022-11-23 DIAGNOSIS — I69354 Hemiplegia and hemiparesis following cerebral infarction affecting left non-dominant side: Secondary | ICD-10-CM | POA: Diagnosis not present

## 2022-11-23 DIAGNOSIS — M1990 Unspecified osteoarthritis, unspecified site: Secondary | ICD-10-CM | POA: Diagnosis not present

## 2022-11-23 DIAGNOSIS — Z87891 Personal history of nicotine dependence: Secondary | ICD-10-CM | POA: Diagnosis not present

## 2022-11-23 DIAGNOSIS — Z7982 Long term (current) use of aspirin: Secondary | ICD-10-CM | POA: Diagnosis not present

## 2022-11-23 DIAGNOSIS — E785 Hyperlipidemia, unspecified: Secondary | ICD-10-CM | POA: Diagnosis not present

## 2022-11-23 DIAGNOSIS — I69322 Dysarthria following cerebral infarction: Secondary | ICD-10-CM | POA: Diagnosis not present

## 2022-11-23 DIAGNOSIS — Z9181 History of falling: Secondary | ICD-10-CM | POA: Diagnosis not present

## 2022-11-23 DIAGNOSIS — K5900 Constipation, unspecified: Secondary | ICD-10-CM | POA: Diagnosis not present

## 2022-11-23 DIAGNOSIS — I10 Essential (primary) hypertension: Secondary | ICD-10-CM | POA: Diagnosis not present

## 2022-11-26 ENCOUNTER — Telehealth: Payer: Self-pay

## 2022-11-26 DIAGNOSIS — E785 Hyperlipidemia, unspecified: Secondary | ICD-10-CM | POA: Diagnosis not present

## 2022-11-26 DIAGNOSIS — Z7982 Long term (current) use of aspirin: Secondary | ICD-10-CM | POA: Diagnosis not present

## 2022-11-26 DIAGNOSIS — I10 Essential (primary) hypertension: Secondary | ICD-10-CM | POA: Diagnosis not present

## 2022-11-26 DIAGNOSIS — I69322 Dysarthria following cerebral infarction: Secondary | ICD-10-CM | POA: Diagnosis not present

## 2022-11-26 DIAGNOSIS — Z7902 Long term (current) use of antithrombotics/antiplatelets: Secondary | ICD-10-CM | POA: Diagnosis not present

## 2022-11-26 DIAGNOSIS — M1990 Unspecified osteoarthritis, unspecified site: Secondary | ICD-10-CM | POA: Diagnosis not present

## 2022-11-26 DIAGNOSIS — K5900 Constipation, unspecified: Secondary | ICD-10-CM | POA: Diagnosis not present

## 2022-11-26 DIAGNOSIS — I69354 Hemiplegia and hemiparesis following cerebral infarction affecting left non-dominant side: Secondary | ICD-10-CM | POA: Diagnosis not present

## 2022-11-26 DIAGNOSIS — Z87891 Personal history of nicotine dependence: Secondary | ICD-10-CM | POA: Diagnosis not present

## 2022-11-26 DIAGNOSIS — Z9181 History of falling: Secondary | ICD-10-CM | POA: Diagnosis not present

## 2022-11-26 NOTE — Telephone Encounter (Signed)
[name] PT calls nurse line requesting verbal orders for extended [facility] PT as follows.   1x a week for 2 weeks   Daniel [name] is also requesting a referral for outpatient rehab for patient. She reports he would benefit from a "neuro" based rehab, something "more aggressive" than what [facility] can offer.   Advised patient has an apt tomorrow.   Will forward to PCP.

## 2022-11-26 NOTE — Progress Notes (Deleted)
    SUBJECTIVE:   CHIEF COMPLAINT / HPI:   CVA  Hypertension  Weight   Oct 7 - 23 Cxr - 1.5 cm nodular density projecting near the apex of the left upper lobe. Follow-up nonemergent outpatient noncontrast chest CT is recommended in the near future to better evaluate this finding and exclude underlying malignancy  PERTINENT  PMH / PSH: *** Patient Active Problem List   Diagnosis Date Noted   CVA (cerebral vascular accident) ([place]) 07/19/2022    Priority: 1.   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   losartan (COZAAR) 50 MG tablet TAKE 1 TABLET BY MOUTH EVERYDAY AT BEDTIME   nystatin (MYCOSTATIN) 500,000 Units, Oral, 4 times daily   omeprazole (PRILOSEC) 20 mg, Oral, Daily   rosuvastatin (CRESTOR) 20 mg, Oral, Daily   senna-docusate (SENOKOT-S) 8.6-50 MG tablet 1 tablet, Oral, 2 times daily   traZODone (DESYREL) 75 mg, Oral, Daily at bedtime       09/04/2022   10:50 AM 08/27/2022   11:26 AM 08/25/2022   11:15 AM  Vitals with BMI  Weight  174 lbs 13 oz   Systolic [phone]  Diastolic 75 81 82  Pulse 71 77 53      OBJECTIVE:   There were no vitals taken for this visit.  ***  ASSESSMENT/PLAN:   There are no diagnoses linked to this encounter.   There are no Patient Instructions on file for this visit.   Lind Covert, MD [place]

## 2022-11-27 ENCOUNTER — Ambulatory Visit: Payer: Medicare HMO | Admitting: Family Medicine

## 2022-11-27 DIAGNOSIS — Z7902 Long term (current) use of antithrombotics/antiplatelets: Secondary | ICD-10-CM | POA: Diagnosis not present

## 2022-11-27 DIAGNOSIS — I10 Essential (primary) hypertension: Secondary | ICD-10-CM | POA: Diagnosis not present

## 2022-11-27 DIAGNOSIS — D649 Anemia, unspecified: Secondary | ICD-10-CM | POA: Insufficient documentation

## 2022-11-27 DIAGNOSIS — Z87891 Personal history of nicotine dependence: Secondary | ICD-10-CM | POA: Diagnosis not present

## 2022-11-27 DIAGNOSIS — K5900 Constipation, unspecified: Secondary | ICD-10-CM | POA: Diagnosis not present

## 2022-11-27 DIAGNOSIS — I69354 Hemiplegia and hemiparesis following cerebral infarction affecting left non-dominant side: Secondary | ICD-10-CM | POA: Diagnosis not present

## 2022-11-27 DIAGNOSIS — Z7982 Long term (current) use of aspirin: Secondary | ICD-10-CM | POA: Diagnosis not present

## 2022-11-27 DIAGNOSIS — M1990 Unspecified osteoarthritis, unspecified site: Secondary | ICD-10-CM | POA: Diagnosis not present

## 2022-11-27 DIAGNOSIS — E785 Hyperlipidemia, unspecified: Secondary | ICD-10-CM | POA: Diagnosis not present

## 2022-11-27 DIAGNOSIS — I69322 Dysarthria following cerebral infarction: Secondary | ICD-10-CM | POA: Diagnosis not present

## 2022-11-27 DIAGNOSIS — Z9181 History of falling: Secondary | ICD-10-CM | POA: Diagnosis not present

## 2022-11-27 NOTE — Progress Notes (Unsigned)
    SUBJECTIVE:   CHIEF COMPLAINT / HPI:   Ambulation after CVA He is able to take a few steps with a walker.  Is getting home Physical Therapy and OT and speech.  Will be moving to outpt Physical Therapy.  Really wants to walk again   Hypertension Has been good at his home readings.  Brings in his medications and is taking regularly  Weight He does not have a good appetite sometimes just does not feel like eating.  Does not like supplement shakes.  His daughter is trying to encourage varied foods and adding protein powder   Lung Nodule Oct 7 - 23 Cxr - "1.5 cm nodular density projecting near the apex of the left upper lobe. Follow-up nonemergent outpatient noncontrast chest CT is recommended in the near future to better evaluate this finding and exclude underlying malignancy" He does not have any pulmonary symptoms   OBJECTIVE:   There were no vitals taken for this visit.  Sitting in WC Heart - Regular rate and rhythm.  No murmurs, gallops or rubs.    Lungs:  Normal respiratory effort, chest expands symmetrically. Lungs are clear to auscultation, no crackles or wheezes.   ASSESSMENT/PLAN:   There are no diagnoses linked to this encounter.   There are no Patient Instructions on file for this visit.   Lind Covert, MD [place]

## 2022-11-28 ENCOUNTER — Other Ambulatory Visit: Payer: Self-pay

## 2022-11-28 ENCOUNTER — Ambulatory Visit (INDEPENDENT_AMBULATORY_CARE_PROVIDER_SITE_OTHER): Payer: Medicare HMO | Admitting: Family Medicine

## 2022-11-28 ENCOUNTER — Encounter: Payer: Self-pay | Admitting: Family Medicine

## 2022-11-28 VITALS — BP 128/79 | HR 65 | Wt 157.6 lb

## 2022-11-28 DIAGNOSIS — I6381 Other cerebral infarction due to occlusion or stenosis of small artery: Secondary | ICD-10-CM | POA: Diagnosis not present

## 2022-11-28 DIAGNOSIS — R911 Solitary pulmonary nodule: Secondary | ICD-10-CM | POA: Insufficient documentation

## 2022-11-28 DIAGNOSIS — D649 Anemia, unspecified: Secondary | ICD-10-CM | POA: Diagnosis not present

## 2022-11-28 DIAGNOSIS — I10 Essential (primary) hypertension: Secondary | ICD-10-CM

## 2022-11-28 NOTE — Patient Instructions (Signed)
Good to see you today - Thank you for coming in  Things we discussed today:  Shoulder pain - can take 2 Tylenol tabs four times a day as needed  Your goal blood pressure is less than 140/90.  Check your blood pressure several times a week.  If regularly higher than this please let me know - either with MyChart or leaving a phone message.  I will call you if your tests are not good.  Otherwise, I will send you a message on MyChart (if it is active) or a letter in the mail..  If you do not hear from me with in 2 weeks please call our office.     Will let you know about the CT scan for the lung nodule  Let me know if any problems with Physical Therapy   Please always bring your medication bottles  Come back to see me in 3 months

## 2022-11-29 ENCOUNTER — Encounter: Payer: Self-pay | Admitting: Family Medicine

## 2022-11-29 LAB — CBC
Hematocrit: 42 % (ref 37.5–51.0)
Hemoglobin: 13.7 g/dL (ref 13.0–17.7)
MCH: 28.2 pg (ref 26.6–33.0)
MCHC: 32.6 g/dL (ref 31.5–35.7)
MCV: 87 fL (ref 79–97)
Platelets: 278 10*3/uL (ref 150–450)
RBC: 4.85 x10E6/uL (ref 4.14–5.80)
RDW: 13.3 % (ref 11.6–15.4)
WBC: 7.4 10*3/uL (ref 3.4–10.8)

## 2022-11-29 LAB — BASIC METABOLIC PANEL
BUN/Creatinine Ratio: 21 (ref 10–24)
BUN: 17 mg/dL (ref 8–27)
CO2: 24 mmol/L (ref 20–29)
Calcium: 10.3 mg/dL — ABNORMAL HIGH (ref 8.6–10.2)
Chloride: 99 mmol/L (ref 96–106)
Creatinine, Ser: 0.81 mg/dL (ref 0.76–1.27)
Glucose: 103 mg/dL — ABNORMAL HIGH (ref 70–99)
Potassium: 4.4 mmol/L (ref 3.5–5.2)
Sodium: 138 mmol/L (ref 134–144)
eGFR: 91 mL/min/{1.73_m2} (ref >59)

## 2022-11-29 LAB — FERRITIN: Ferritin: 412 ng/mL — ABNORMAL HIGH (ref 30–400)

## 2022-11-29 NOTE — Assessment & Plan Note (Signed)
Well controlled today and by report at home

## 2022-11-29 NOTE — Assessment & Plan Note (Signed)
Will get follow up CT

## 2022-11-29 NOTE — Assessment & Plan Note (Signed)
History of anemia.  Check cbc today

## 2022-11-29 NOTE — Assessment & Plan Note (Signed)
Slow improvement. Continue Physical Therapy

## 2022-11-30 DIAGNOSIS — I10 Essential (primary) hypertension: Secondary | ICD-10-CM | POA: Diagnosis not present

## 2022-11-30 DIAGNOSIS — I69354 Hemiplegia and hemiparesis following cerebral infarction affecting left non-dominant side: Secondary | ICD-10-CM | POA: Diagnosis not present

## 2022-11-30 DIAGNOSIS — Z87891 Personal history of nicotine dependence: Secondary | ICD-10-CM | POA: Diagnosis not present

## 2022-11-30 DIAGNOSIS — M1990 Unspecified osteoarthritis, unspecified site: Secondary | ICD-10-CM | POA: Diagnosis not present

## 2022-11-30 DIAGNOSIS — Z7902 Long term (current) use of antithrombotics/antiplatelets: Secondary | ICD-10-CM | POA: Diagnosis not present

## 2022-11-30 DIAGNOSIS — K5900 Constipation, unspecified: Secondary | ICD-10-CM | POA: Diagnosis not present

## 2022-11-30 DIAGNOSIS — I69322 Dysarthria following cerebral infarction: Secondary | ICD-10-CM | POA: Diagnosis not present

## 2022-11-30 DIAGNOSIS — Z9181 History of falling: Secondary | ICD-10-CM | POA: Diagnosis not present

## 2022-11-30 DIAGNOSIS — E785 Hyperlipidemia, unspecified: Secondary | ICD-10-CM | POA: Diagnosis not present

## 2022-11-30 DIAGNOSIS — Z7982 Long term (current) use of aspirin: Secondary | ICD-10-CM | POA: Diagnosis not present

## 2022-12-04 DIAGNOSIS — Z7902 Long term (current) use of antithrombotics/antiplatelets: Secondary | ICD-10-CM | POA: Diagnosis not present

## 2022-12-04 DIAGNOSIS — E785 Hyperlipidemia, unspecified: Secondary | ICD-10-CM | POA: Diagnosis not present

## 2022-12-04 DIAGNOSIS — K5900 Constipation, unspecified: Secondary | ICD-10-CM | POA: Diagnosis not present

## 2022-12-04 DIAGNOSIS — Z87891 Personal history of nicotine dependence: Secondary | ICD-10-CM | POA: Diagnosis not present

## 2022-12-04 DIAGNOSIS — Z7982 Long term (current) use of aspirin: Secondary | ICD-10-CM | POA: Diagnosis not present

## 2022-12-04 DIAGNOSIS — M1990 Unspecified osteoarthritis, unspecified site: Secondary | ICD-10-CM | POA: Diagnosis not present

## 2022-12-04 DIAGNOSIS — I10 Essential (primary) hypertension: Secondary | ICD-10-CM | POA: Diagnosis not present

## 2022-12-04 DIAGNOSIS — Z9181 History of falling: Secondary | ICD-10-CM | POA: Diagnosis not present

## 2022-12-04 DIAGNOSIS — I69322 Dysarthria following cerebral infarction: Secondary | ICD-10-CM | POA: Diagnosis not present

## 2022-12-04 DIAGNOSIS — I69354 Hemiplegia and hemiparesis following cerebral infarction affecting left non-dominant side: Secondary | ICD-10-CM | POA: Diagnosis not present

## 2022-12-07 DIAGNOSIS — I69322 Dysarthria following cerebral infarction: Secondary | ICD-10-CM | POA: Diagnosis not present

## 2022-12-07 DIAGNOSIS — Z7982 Long term (current) use of aspirin: Secondary | ICD-10-CM | POA: Diagnosis not present

## 2022-12-07 DIAGNOSIS — I10 Essential (primary) hypertension: Secondary | ICD-10-CM | POA: Diagnosis not present

## 2022-12-07 DIAGNOSIS — Z87891 Personal history of nicotine dependence: Secondary | ICD-10-CM | POA: Diagnosis not present

## 2022-12-07 DIAGNOSIS — I69354 Hemiplegia and hemiparesis following cerebral infarction affecting left non-dominant side: Secondary | ICD-10-CM | POA: Diagnosis not present

## 2022-12-07 DIAGNOSIS — K5900 Constipation, unspecified: Secondary | ICD-10-CM | POA: Diagnosis not present

## 2022-12-07 DIAGNOSIS — Z7902 Long term (current) use of antithrombotics/antiplatelets: Secondary | ICD-10-CM | POA: Diagnosis not present

## 2022-12-07 DIAGNOSIS — M1990 Unspecified osteoarthritis, unspecified site: Secondary | ICD-10-CM | POA: Diagnosis not present

## 2022-12-07 DIAGNOSIS — Z9181 History of falling: Secondary | ICD-10-CM | POA: Diagnosis not present

## 2022-12-07 DIAGNOSIS — E785 Hyperlipidemia, unspecified: Secondary | ICD-10-CM | POA: Diagnosis not present

## 2022-12-11 ENCOUNTER — Telehealth: Payer: Self-pay | Admitting: Neurology

## 2022-12-11 DIAGNOSIS — Z9181 History of falling: Secondary | ICD-10-CM | POA: Diagnosis not present

## 2022-12-11 DIAGNOSIS — I69354 Hemiplegia and hemiparesis following cerebral infarction affecting left non-dominant side: Secondary | ICD-10-CM | POA: Diagnosis not present

## 2022-12-11 DIAGNOSIS — M1990 Unspecified osteoarthritis, unspecified site: Secondary | ICD-10-CM | POA: Diagnosis not present

## 2022-12-11 DIAGNOSIS — K5900 Constipation, unspecified: Secondary | ICD-10-CM | POA: Diagnosis not present

## 2022-12-11 DIAGNOSIS — I10 Essential (primary) hypertension: Secondary | ICD-10-CM | POA: Diagnosis not present

## 2022-12-11 DIAGNOSIS — Z7982 Long term (current) use of aspirin: Secondary | ICD-10-CM | POA: Diagnosis not present

## 2022-12-11 DIAGNOSIS — I6381 Other cerebral infarction due to occlusion or stenosis of small artery: Secondary | ICD-10-CM

## 2022-12-11 DIAGNOSIS — Z7902 Long term (current) use of antithrombotics/antiplatelets: Secondary | ICD-10-CM | POA: Diagnosis not present

## 2022-12-11 DIAGNOSIS — E785 Hyperlipidemia, unspecified: Secondary | ICD-10-CM | POA: Diagnosis not present

## 2022-12-11 DIAGNOSIS — Z87891 Personal history of nicotine dependence: Secondary | ICD-10-CM | POA: Diagnosis not present

## 2022-12-11 DIAGNOSIS — I69322 Dysarthria following cerebral infarction: Secondary | ICD-10-CM | POA: Diagnosis not present

## 2022-12-11 NOTE — Telephone Encounter (Signed)
[facility]) request referral for outpatient PT and OT. Pt Would like referral sent somewhere in [place]. If have any questions can call back [phone].

## 2022-12-12 DIAGNOSIS — Z7902 Long term (current) use of antithrombotics/antiplatelets: Secondary | ICD-10-CM | POA: Diagnosis not present

## 2022-12-12 DIAGNOSIS — Z9181 History of falling: Secondary | ICD-10-CM | POA: Diagnosis not present

## 2022-12-12 DIAGNOSIS — I69354 Hemiplegia and hemiparesis following cerebral infarction affecting left non-dominant side: Secondary | ICD-10-CM | POA: Diagnosis not present

## 2022-12-12 DIAGNOSIS — Z7982 Long term (current) use of aspirin: Secondary | ICD-10-CM | POA: Diagnosis not present

## 2022-12-12 DIAGNOSIS — I69322 Dysarthria following cerebral infarction: Secondary | ICD-10-CM | POA: Diagnosis not present

## 2022-12-12 DIAGNOSIS — I10 Essential (primary) hypertension: Secondary | ICD-10-CM | POA: Diagnosis not present

## 2022-12-12 DIAGNOSIS — M1990 Unspecified osteoarthritis, unspecified site: Secondary | ICD-10-CM | POA: Diagnosis not present

## 2022-12-12 DIAGNOSIS — E785 Hyperlipidemia, unspecified: Secondary | ICD-10-CM | POA: Diagnosis not present

## 2022-12-12 DIAGNOSIS — K5900 Constipation, unspecified: Secondary | ICD-10-CM | POA: Diagnosis not present

## 2022-12-12 DIAGNOSIS — Z87891 Personal history of nicotine dependence: Secondary | ICD-10-CM | POA: Diagnosis not present

## 2022-12-12 NOTE — Addendum Note (Signed)
Addended by: Suzzanne Cloud on: 12/12/2022 03:06 PM   Modules accepted: Orders

## 2022-12-12 NOTE — Telephone Encounter (Signed)
These need to be placed as outpatient referrals not home health

## 2022-12-12 NOTE — Telephone Encounter (Signed)
PT and OT referrals sent [facility] to [facility]

## 2022-12-12 NOTE — Telephone Encounter (Signed)
Orders Placed This Encounter  Procedures   Ambulatory referral to Physical Therapy   Ambulatory referral to Occupational Therapy

## 2022-12-12 NOTE — Telephone Encounter (Signed)
Called [facility] back. Stated she has reached out to PCP twice and without a call-back. She said pt really needs this referral and will she reconsider.

## 2022-12-12 NOTE — Telephone Encounter (Signed)
Orders Placed This Encounter  Procedures   Ambulatory referral to [facility]

## 2022-12-13 DIAGNOSIS — I639 Cerebral infarction, unspecified: Secondary | ICD-10-CM | POA: Diagnosis not present

## 2022-12-14 DIAGNOSIS — I639 Cerebral infarction, unspecified: Secondary | ICD-10-CM | POA: Diagnosis not present

## 2022-12-24 ENCOUNTER — Telehealth: Payer: Self-pay | Admitting: Family Medicine

## 2022-12-24 ENCOUNTER — Encounter: Payer: Self-pay | Admitting: Family Medicine

## 2022-12-24 MED ORDER — SERTRALINE HCL 50 MG PO TABS: 50.0000 mg | ORAL_TABLET | Freq: Every day | ORAL | 3 refills | Status: DC

## 2022-12-24 NOTE — Telephone Encounter (Signed)
Spoke with daughter  Has not heard from Physical Therapy or OT have been calling his cell need to call his daughter cell [phone] (Mobile)   Loss of appetite Eating only small bites Refuses Ensure and most food No choking   Will start sertraline  Encourage any types of food  See me in 3 weeks

## 2022-12-27 ENCOUNTER — Encounter: Payer: Self-pay | Admitting: Family Medicine

## 2022-12-27 NOTE — Therapy (Signed)
OUTPATIENT OCCUPATIONAL THERAPY NEURO EVALUATION  Patient Name: Daniel [name] MRN: [id number] DOB:[birth year], 77 y.o., male Today's Date: 01/02/2023  PCP: Lind Covert, MD REFERRING PROVIDER: Suzzanne Cloud, NP  END OF SESSION:  OT End of Session - 01/02/23 0940     Visit Number 1    Number of Visits 17    Date for OT Re-Evaluation 03/03/23    Authorization Type [employer] MCR    Progress Note Due on Visit 10    OT Start Time 0845    OT Stop Time 0930    OT Time Calculation (min) 45 min    Activity Tolerance Patient tolerated treatment well    Behavior During Therapy [facility] for tasks assessed/performed             Past Medical History:  Diagnosis Date   Hernia of fascia 2008   repair with many complications    Hypertension    Past Surgical History:  Procedure Laterality Date   APPENDECTOMY     HERNIA REPAIR     Patient Active Problem List   Diagnosis Date Noted   Lung nodule 11/28/2022   Anemia 11/27/2022   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 ([place]) 07/24/2022   Dizziness 07/19/2022   CVA (cerebral vascular accident) ([place]) 07/19/2022   Prediabetes 07/19/2022   Memory change 01/06/2019   Falling 11/21/2016   Essential hypertension 09/27/2016   DJD (degenerative joint disease) 09/27/2016   Stasis dermatitis 09/27/2016    ONSET DATE: 12/12/2022  REFERRING DIAG: Diagnosis I63.81 (ICD-10-CM) - 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 past medical history of HTN, OA, Hard of Hearing (HOH)   PRECAUTIONS: Fall and Other: not driving , HOH  WEIGHT BEARING RESTRICTIONS: No  PAIN:  Are you having pain? Yes: NPRS scale: 0-10/10 Pain location: neck and LUE Pain description: achy Aggravating factors: use, certain movements Relieving factors: tylenol, blue emu  FALLS: Has patient fallen in last 6 months? Yes. Number of falls 1  LIVING ENVIRONMENT: Lives with: lives with their family Lives in: House/apartment - modular home 1story, ramp to enter Has following equipment at home: Hemi walker, Wheelchair (manual), Shower bench, bed side commode, and Ramped entry  PLOF: Independent with basic ADLs and Leisure: "scraping"  PATIENT GOALS: To walk  Daughter reports he is not very motivated and only does things when he wants to. Daughter also concerned about depression and not eating much - therapist encouraged her to discuss with pt's physician  OBJECTIVE:   HAND DOMINANCE: Right  ADLs:  Transfers/ambulation related to ADLs: CGA to min assist Eating: assist to cut food, pt feeds self but food is softened Grooming: pt brushes hair, never brushed teeth (pt only has 4 remaining teeth) UB Dressing: mod assist LB Dressing: max assist to dependent (occasionally does Rt shoe w/ velcro)  Toileting: incontinent, wears adult diaper sometimes, uses urinal, rarely uses toilet Bathing: sponge bathing - dependent  Tub Shower transfers: pt/daughter has only attempted once Equipment: Transfer tub bench  IADLs: daughter does all IADLS Shopping: dep  but pt does sometimes go with daughter Light housekeeping: dep Meal Prep: dep  Community mobility: dep Medication management: dep Financial management: dep Handwriting:  pt approx 90% legible, dtr reports pretty close to before just a bit shaky  MOBILITY STATUS:  uses w/c  POSTURE COMMENTS:  rounded shoulders, forward head, and posterior pelvic  tilt   ACTIVITY TOLERANCE: Activity tolerance: limited by motivation    UPPER EXTREMITY ROM:  RUE AROM WFL's - pt has DuPuytren's Rt small finger  LUE: No A/ROM.  Pt also limited P/ROM - 50% composite finger, 75% wrist extension and elbow flexion, can only tolerate approx 80* passive sh flexion d/t pain   HAND FUNCTION: Pt has no hand function Lt side  COORDINATION: No movement/coordination Lt hand  SENSATION: Daughter reports Lt thumb absent prior to stroke, but rest of hand appears intact  EDEMA: mild at time of evaluation  MUSCLE TONE: LUE: Flaccid  COGNITION: Overall cognitive status:  HOH which affects ability to follow commands, but pt can follow 1 step commands when he hears it  VISION: Subjective report: Pt reports vision is blurry at times (mostly first thing in morning and at night) but was good before stroke Baseline vision: Wears glasses all the time Visual history: corrective eye surgery for cataracts  VISION ASSESSMENT: Not tested  Patient has difficulty with following activities due to following visual impairments: Reading, watching smaller TV  PERCEPTION: Not tested  PRAXIS: Not tested  OBSERVATIONS: arrives in w/c and sling LUE, 1" finger width subluxation Lt shoulder, elevated Lt scapula w/ minimal scapula elevation, 50% retraction, pain at neck and Lt shoulder at times, Lt hand began shaking at end of evaluation - ? clonus   TODAY'S TREATMENT:                                                                                                                              N/A today - eval only   PATIENT EDUCATION: Education details: OT POC and reasons for d/c (including: lack of carryover at home, motivation, lack of improvement)  Person educated: Patient and daughter (pt's caregiver)  Education method: Explanation Education comprehension: verbalized understanding  HOME EXERCISE PROGRAM: N/A day of eval   GOALS: Goals reviewed with patient?  Yes  SHORT TERM GOALS: Target date: 01/31/23  Pt/caregiver Independent with stretches for LUE Baseline: Goal status: INITIAL  2.  Pt to don shirt with min assist or less Baseline: max assist Goal status: INITIAL  3.  Pt to demo use of rocker knife to cut food Baseline:  Goal status: INITIAL  4.  Pt to participate in 50% of sponge bathing Baseline:  Goal status: INITIAL  5.  Pt/caregiver to verbalize understanding of LUE positioning to prevent pain and reduce edema Lt hand Baseline:  Goal status: INITIAL    LONG TERM GOALS: Target date: 03/03/23  Independent with updated HEP prn Baseline:  Goal status: INITIAL  2.  Pt to don/doff shirt  mod I level  Baseline:  Goal status: INITIAL  3.  Pt to perform toilet transfers consistently with min assist for clothes management Baseline:  Goal status: INITIAL  4.  Pt to perform LB dressing with mod assist Baseline:  Goal status: INITIAL  5.  Pt to perform 75% sponge bathing with A/E prn  Baseline:  Goal status: INITIAL    ASSESSMENT:  CLINICAL IMPRESSION: Patient is a 77 y.o. male who was seen today for occupational therapy evaluation following Rt thalamic CVA on 07/19/22. Pt presents today in w/c with sling for LUE. Daughter (who is his primary caregiver) accompanies him and provides much of the information on ADL status. Pt presents with dense Lt non dominant side hemiplegia w/ flaccid LUE - no active movement. Pt also with limited participation in BADLS and pain Lt shoulder/UE. Pt would benefit from OT to address these deficits and reduce caregiver burden of care. Anticipate pt's motivation to be a hindering factor in therapy and progression of function based on daughter's reports and today's findings.   PERFORMANCE DEFICITS: in functional skills including ADLs, IADLs, coordination, dexterity, edema, tone, ROM, strength, pain, Fine motor control, Gross motor control, hearing, mobility, balance, body mechanics, endurance,  decreased knowledge of use of DME, and UE functional use, cognitive skills including energy/drive, and psychosocial skills including habits.   IMPAIRMENTS: are limiting patient from ADLs, IADLs, and leisure.   CO-MORBIDITIES: may have co-morbidities  that affects occupational performance. Patient will benefit from skilled OT to address above impairments and improve overall function.  MODIFICATION OR ASSISTANCE TO COMPLETE EVALUATION: Min-Moderate modification of tasks or assist with assess necessary to complete an evaluation.  OT OCCUPATIONAL PROFILE AND HISTORY: Problem focused assessment: Including review of records relating to presenting problem.  CLINICAL DECISION MAKING: Moderate - several treatment options, min-mod task modification necessary  REHAB POTENTIAL: Fair decreased motivation, ? depression  EVALUATION COMPLEXITY: Low    PLAN:  OT FREQUENCY: 2x/week  OT DURATION: 8 weeks (plus eval) - however may d/c sooner if pt not improving d/t poor carryover of recommendations at home  PLANNED INTERVENTIONS: self care/ADL training, therapeutic exercise, therapeutic activity, neuromuscular re-education, manual therapy, passive range of motion, functional mobility training, splinting, electrical stimulation, ultrasound, paraffin, fluidotherapy, moist heat, patient/family education, visual/perceptual remediation/compensation, energy conservation, coping strategies training, and DME and/or AE instructions  RECOMMENDED OTHER SERVICES: none at this time  CONSULTED AND AGREED WITH PLAN OF CARE: Patient and family member/caregiver  PLAN FOR NEXT SESSION: initiate P/ROM HEP for LUE   Hans Eden, OT 01/02/2023, 9:41 AM

## 2022-12-31 ENCOUNTER — Encounter: Payer: Self-pay | Admitting: Family Medicine

## 2023-01-02 ENCOUNTER — Other Ambulatory Visit: Payer: Self-pay

## 2023-01-02 ENCOUNTER — Ambulatory Visit: Payer: Medicare HMO | Admitting: Occupational Therapy

## 2023-01-02 ENCOUNTER — Ambulatory Visit: Payer: Medicare HMO | Attending: Neurology | Admitting: Physical Therapy

## 2023-01-02 ENCOUNTER — Encounter: Payer: Self-pay | Admitting: Physical Therapy

## 2023-01-02 VITALS — BP 108/72 | HR 115

## 2023-01-02 DIAGNOSIS — I69354 Hemiplegia and hemiparesis following cerebral infarction affecting left non-dominant side: Secondary | ICD-10-CM

## 2023-01-02 DIAGNOSIS — R29818 Other symptoms and signs involving the nervous system: Secondary | ICD-10-CM

## 2023-01-02 DIAGNOSIS — R278 Other lack of coordination: Secondary | ICD-10-CM | POA: Diagnosis not present

## 2023-01-02 DIAGNOSIS — M6281 Muscle weakness (generalized): Secondary | ICD-10-CM | POA: Insufficient documentation

## 2023-01-02 DIAGNOSIS — I6381 Other cerebral infarction due to occlusion or stenosis of small artery: Secondary | ICD-10-CM | POA: Insufficient documentation

## 2023-01-02 DIAGNOSIS — M25512 Pain in left shoulder: Secondary | ICD-10-CM | POA: Diagnosis not present

## 2023-01-02 DIAGNOSIS — R2681 Unsteadiness on feet: Secondary | ICD-10-CM | POA: Diagnosis not present

## 2023-01-02 DIAGNOSIS — R2689 Other abnormalities of gait and mobility: Secondary | ICD-10-CM | POA: Diagnosis not present

## 2023-01-02 NOTE — Therapy (Signed)
OUTPATIENT PHYSICAL THERAPY NEURO EVALUATION   Patient Name: Daniel [name] MRN: [id number] DOB:1946-08-10, 77 y.o., male Today's Date: 01/02/2023  PCP: Lind Covert, MD REFERRING PROVIDER: Suzzanne Cloud, NP  END OF SESSION:  01/02/23 0947  PT Visits / Re-Eval  Visit Number 1  Number of Visits 17  Date for PT Re-Evaluation 03/08/23 (pushed out due to scheduling delay)  Authorization  Authorization Type [employer] MEDICARE  Progress Note Due on Visit 10  PT Time Calculation  PT Start Time [phone] (handoff from OT)  PT Stop Time 1024  PT Time Calculation (min) 47 min  PT - End of Session  Equipment Utilized During Treatment Gait belt  Activity Tolerance Other (comment) (Pt is hard of hearing bilaterally, has hearing aides that he does not use.)  Behavior During Therapy [facility] for tasks assessed/performed;Anxious (Pt states he is nervous; fidgetting throughout assessment)    Past Medical History:  Diagnosis Date   Hernia of fascia 2008   repair with many complications    Hypertension    Past Surgical History:  Procedure Laterality Date   APPENDECTOMY     HERNIA REPAIR     Patient Active Problem List   Diagnosis Date Noted   Lung nodule 11/28/2022   Anemia 11/27/2022   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 ([place]) 07/24/2022   Dizziness 07/19/2022   CVA (cerebral vascular accident) ([place]) 07/19/2022   Prediabetes 07/19/2022   Memory change 01/06/2019   Falling 11/21/2016   Essential hypertension 09/27/2016   DJD (degenerative joint disease) 09/27/2016   Stasis dermatitis 09/27/2016    ONSET DATE: 07/19/2022 (date of CVA)  REFERRING DIAG: I63.81 (ICD-10-CM) - 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  Pt slowed down following this incident and he had his stroke on 8/31.  He was dizzy and weak on 8/30 so took it easy.  Felt sluggish and then on 8/31 daughter made appt w/ PCP.  PCP sent to the ED.  Daughter reports patient would like to walk, but the problem is getting him up and getting him going.  HHPT lasted 34  visits (PT only) over roughly 3 months.  He was able to attempt a couple steps w/ HH.  Daughter states he may or may not do his exercise.  Pt accompanied by: family member-daughter  PERTINENT HISTORY: HTN, right BG CVA (Sept 2023), lung nodule (being followed by pulmonology), repeated falls, memory changes  PAIN:  Are you having pain? Yes: NPRS scale: 2-3/10 Pain location: behind shoulders Pain description: achy Aggravating factors: forward bent head/neck and sitting too long Relieving factors: unsure  PRECAUTIONS: Fall  WEIGHT BEARING RESTRICTIONS: No  FALLS: Has patient fallen in last 6 months? Yes. Number of falls 1-getting up to go to the bedside commode w/o shoes on and daughter assisted his slide to the floor, unharmed  LIVING ENVIRONMENT: Lives with: lives with their family Lives in: House/apartment Stairs: No-have ramped entry Has following equipment at home: Hemi walker, Wheelchair (manual), shower chair, bed side commode, Ramped entry, and adult diapers (pt prefers bowel movements w/ these vs bedside commode) and occasional sponge bath vs showering  PLOF: Requires assistive device for independence, Needs assistance with ADLs, Needs assistance with homemaking, Needs assistance with gait, and Needs assistance with transfers  PATIENT GOALS: "I want to start  walking as soon as I can!"  OBJECTIVE:   DIAGNOSTIC FINDINGS: MRI BRAIN 07/23/2022: IMPRESSION: 1. Increased size of acute infarct involving the right corona radiata and basal ganglia. 2. 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 3. Newly abnormal appearance of the nondominant distal right vertebral artery which may reflect slow flow or occlusion.  COGNITION: Overall cognitive status: History of cognitive impairments - at baseline and daughter reports potential ST needs-drooling, denying food due to swallowing/chewing difficulties, memory changes   SENSATION: WFL  COORDINATION: Not formally assessed.  EDEMA:  Mild edema noted from just above left knee down to ankle.  MUSCLE TONE: none noted in LLE, limited assessment.  POSTURE: rounded shoulders, forward head, flexed trunk , and severe forward head in seated/standing  LOWER EXTREMITY ROM:     Passive  Right Eval Left Eval  Hip flexion    Hip extension    Hip abduction    Hip adduction    Hip internal rotation    Hip external rotation    Knee flexion    Knee extension  Lacking 5-10 degrees of extension actively, tight to PROM into extension w/ pain  Ankle dorsiflexion    Ankle plantarflexion    Ankle inversion    Ankle eversion     (Blank rows = not tested)  LOWER EXTREMITY MMT:   *Functionally assessed, pt can stand against gravity. MMT Right Eval Left Eval  Hip flexion    Hip extension    Hip abduction    Hip adduction    Hip internal rotation    Hip external rotation    Knee flexion    Knee extension    Ankle dorsiflexion    Ankle plantarflexion    Ankle inversion    Ankle eversion    (Blank rows = not tested)  BED MOBILITY:  Daughter provides assistance (minA) for getting into and out of bed  TRANSFERS: Assistive device utilized: Hemi walker  Sit to stand: CGA Stand to sit: CGA Chair to chair: CGA  GAIT: Gait pattern: step to pattern, decreased stride  length, decreased hip/knee flexion- Right, decreased hip/knee flexion- Left, knee flexed in stance- Left, trunk flexed, and narrow BOS Distance walked: 8' Assistive device utilized: Hemi walker Level of assistance: CGA Comments: W/C follow  for brief assessment of gait.  FUNCTIONAL TESTS:  Timed up and go (TUG): To be assessed. Berg Balance Scale: To be assessed.  PATIENT SURVEYS:  FOTO To be completed  TODAY'S TREATMENT:                                                                                                                              DATE: N/A   PATIENT EDUCATION: Education details: PT POC, assessments used, and goals to be set.  Obtaining ST referral. Person educated: Patient and Child(ren) Education method: Explanation Education comprehension: verbalized understanding and needs further education  HOME EXERCISE PROGRAM: To be established.  GOALS: Goals reviewed with patient? Yes  SHORT TERM GOALS: Target date: 02/01/2023  Pt will be compliant to strength and balance HEP with supervision from family. Baseline:  To be established. Goal status: INITIAL  2.  TUG to be assessed w/ STG/LTG set as appropriate. Baseline: To be assessed. Goal status: INITIAL  3.  BERG to be assessed w/ STG/LTG to be set as appropriate. Baseline: To be assessed. Goal status: INITIAL  4.  Pt will ambulate >/=100 feet with LRAD and contact guard level of assist to promote household access. Baseline: 8' w/ HW and CGA +2 w/c follow Goal status: INITIAL  LONG TERM GOALS: Target date: 03/01/2023  FOTO to be assessed w/ LTG updated as appropriate. Baseline: To be assessed. Goal status: INITIAL  2.   TUG to be assessed w/ STG/LTG set as appropriate. Baseline: To be assessed. Goal status: INITIAL  3.  BERG to be assessed w/ STG/LTG to be set as appropriate. Baseline: To be assessed. Goal status: INITIAL  4.  Pt will ambulate >/=250 feet with LRAD and contact guard level of assist to  promote household and limited community access. Baseline: 8' w/ HW CGA and +2 w/c follow Goal status: INITIAL  5.  Pt will perform rolling left and right and supine to and from sit at supervision level to promote improved independence with bed mobility. Baseline: minA all bed mobility Goal status: INITIAL  ASSESSMENT:  CLINICAL IMPRESSION: Patient is a 77 y.o. male who was seen today for physical therapy evaluation and treatment for right basal ganglia CVA.  Pt has a significant PMH of a lung module and memory loss.  Identified impairments include memory decline, dysphagia, LLE edema impacting primarily the lower leg, severe chronic forward head and other postural changes, lacking roughly 5 degrees of active left knee extension, gross functional weakness, difficulty w/ bed mobility, and decreased safety w/ standing transfers and ambulation using HW.  Fall risk assessment limited due to time this visit, but will be further assessed via the TUG and BERG next session.  He would benefit from skilled PT to address impairments as noted and progress towards long term goals and patient and daughter in agreement to plan of care as written.  OBJECTIVE IMPAIRMENTS: Abnormal gait, decreased activity tolerance, decreased balance, decreased cognition, decreased endurance, decreased  knowledge of condition, decreased knowledge of use of DME, decreased ROM, decreased strength, decreased safety awareness, increased edema, impaired UE functional use, improper body mechanics, and postural dysfunction.   ACTIVITY LIMITATIONS: carrying, lifting, bending, standing, stairs, transfers, bed mobility, and locomotion level  PARTICIPATION LIMITATIONS: meal prep, cleaning, laundry, medication management, personal finances, driving, community activity, occupation, and yard work  PERSONAL FACTORS: Age, Behavior pattern, Fitness, Time since onset of injury/illness/exacerbation, and 1-2 comorbidities: memory decline and lung  nodule being followed by pulmonology  are also affecting patient's functional outcome.   REHAB POTENTIAL: Fair See personal factors and PMH  CLINICAL DECISION MAKING: Evolving/moderate complexity  EVALUATION COMPLEXITY: Moderate  PLAN:  PT FREQUENCY: 2x/week  PT DURATION: 8 weeks  PLANNED INTERVENTIONS: Therapeutic exercises, Therapeutic activity, Neuromuscular re-education, Balance training, Gait training, Patient/Family education, Self Care, Stair training, Vestibular training, Orthotic/Fit training, DME instructions, Manual therapy, and Re-evaluation  PLAN FOR NEXT SESSION: Assess TUG, BERG, and FOTO-set goals.  Initiate HEP for standing balance and functional LE strength.  Ambulation w/ HW.  What orthotic does pt have?   Bary Richard, PT, DPT 01/02/2023, 10:24 AM

## 2023-01-04 ENCOUNTER — Telehealth: Payer: Self-pay | Admitting: Physical Therapy

## 2023-01-04 DIAGNOSIS — I6381 Other cerebral infarction due to occlusion or stenosis of small artery: Secondary | ICD-10-CM

## 2023-01-04 NOTE — Telephone Encounter (Signed)
Daniel [name] was evaluated by physical therapy on 01/02/2023.  The patient would benefit from a speech therapy evaluation for reports of chewing difficulty, drooling, dysphagia, and general memory decline.   If you agree, please place an order in [facility] workque in [facility] or fax the order to [phone]. Thank you, Elease Etienne, PT, [place] [address] [place], [place]  [address] Phone:  [phone] Fax:  [phone]

## 2023-01-07 ENCOUNTER — Ambulatory Visit: Payer: Medicare HMO | Admitting: Occupational Therapy

## 2023-01-07 ENCOUNTER — Ambulatory Visit: Payer: Medicare HMO | Admitting: Physical Therapy

## 2023-01-07 DIAGNOSIS — R2689 Other abnormalities of gait and mobility: Secondary | ICD-10-CM

## 2023-01-07 DIAGNOSIS — M6281 Muscle weakness (generalized): Secondary | ICD-10-CM

## 2023-01-07 DIAGNOSIS — R278 Other lack of coordination: Secondary | ICD-10-CM | POA: Diagnosis not present

## 2023-01-07 DIAGNOSIS — R2681 Unsteadiness on feet: Secondary | ICD-10-CM | POA: Diagnosis not present

## 2023-01-07 DIAGNOSIS — M25512 Pain in left shoulder: Secondary | ICD-10-CM | POA: Diagnosis not present

## 2023-01-07 DIAGNOSIS — R29818 Other symptoms and signs involving the nervous system: Secondary | ICD-10-CM | POA: Diagnosis not present

## 2023-01-07 DIAGNOSIS — I6381 Other cerebral infarction due to occlusion or stenosis of small artery: Secondary | ICD-10-CM | POA: Diagnosis not present

## 2023-01-07 DIAGNOSIS — I69354 Hemiplegia and hemiparesis following cerebral infarction affecting left non-dominant side: Secondary | ICD-10-CM

## 2023-01-07 NOTE — Patient Instructions (Signed)
Flexion (Assistive)    LAYING DOWN, hold Lt wrist with Rt hand, thumb side up, start with arms bent and reach up to ceiling, straighten elbows as much as possible (chest press motion), then raise further overhead as tolerated. Come back to ceiling then back to chest. Repeat _10___ times. Do __2__ sessions per day.   Flexion (Passive)    Sitting upright, slide forearm forward along table with assist from other hand (place folded towel under forearm), bending from the waist until a stretch is felt. Hold __3__ seconds. Repeat _10___ times. Do __2-3__ sessions per day.   Flexion (Passive)    Use other hand to bend elbow, with thumb toward same shoulder. Do NOT force this motion. Hold __5__ seconds. Repeat _10___ times. Do __2-3__ sessions per day.  Supination (Passive)    Keep elbow bent at right angle and held firmly at side. Use other hand to turn forearm until palm faces upward. Hold __10__ seconds. Repeat __5__ times. Do __2-3__ sessions per day.  SELF ASSISTED: Finger Flexion    Use one hand to close fingers on other hand. KEEP WRIST STRAIGHT. Hold 10 sec, then try and open hand fully! Repeat _5__ reps per set, __2-3_ sets per day    Talk to MD regarding concerns with eating. Discuss:  1) Medicines and decreased taste 2) Possible psychology referral for depression 3) ? Modified Barium swallow as pt reports difficulty swallowing 4) Recommend Boost and protein shakes

## 2023-01-07 NOTE — Addendum Note (Signed)
Addended by: Suzzanne Cloud on: 01/07/2023 05:45 AM   Modules accepted: Orders

## 2023-01-07 NOTE — Therapy (Signed)
OUTPATIENT OCCUPATIONAL THERAPY NEURO TREATMENT  Patient Name: Daniel [name] MRN: [id number] DOB:June 01, 1946, 77 y.o., male Today's Date: 01/07/2023  PCP: Lind Covert, MD REFERRING PROVIDER: Suzzanne Cloud, NP  END OF SESSION:  OT End of Session - 01/07/23 1026     Visit Number 2    Number of Visits 17    Date for OT Re-Evaluation 03/03/23    Authorization Type [employer] MCR    Progress Note Due on Visit 10    OT Start Time 1020    OT Stop Time 1100    OT Time Calculation (min) 40 min    Activity Tolerance Patient tolerated treatment well    Behavior During Therapy [facility] for tasks assessed/performed             Past Medical History:  Diagnosis Date   Hernia of fascia 2008   repair with many complications    Hypertension    Past Surgical History:  Procedure Laterality Date   APPENDECTOMY     HERNIA REPAIR     Patient Active Problem List   Diagnosis Date Noted   Lung nodule 11/28/2022   Anemia 11/27/2022   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 ([place]) 07/24/2022   Dizziness 07/19/2022   CVA (cerebral vascular accident) ([place]) 07/19/2022   Prediabetes 07/19/2022   Memory change 01/06/2019   Falling 11/21/2016   Essential hypertension 09/27/2016   DJD (degenerative joint disease) 09/27/2016   Stasis dermatitis 09/27/2016    ONSET DATE: 12/12/2022  REFERRING DIAG: Diagnosis I63.81 (ICD-10-CM) - 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 (HCC)  THERAPY DIAG:  Hemiplegia and hemiparesis following cerebral infarction affecting left non-dominant side (HCC)  Acute pain of left shoulder  Other symptoms and signs involving the nervous system  Unsteadiness on feet  Rationale for Evaluation and Treatment: Rehabilitation  SUBJECTIVE:   SUBJECTIVE STATEMENT: I was sick for a few days Pt accompanied by: family member and DAUGHTER  PERTINENT HISTORY: presented 07/19/2022 to the ER with left-sided weakness, ataxia, lightheadedness.  past medical history of HTN, OA, Hard of Hearing (HOH)   PRECAUTIONS: Fall and Other: not driving , HOH  WEIGHT BEARING RESTRICTIONS: No  PAIN:  Are you having pain? Yes: NPRS scale: 0-10/10 Pain location: neck and LUE Pain description: achy Aggravating factors: use, certain movements Relieving factors: tylenol, blue emu  FALLS: Has patient fallen in last 6 months? Yes. Number of falls 1  LIVING ENVIRONMENT: Lives with: lives with their family Lives in: House/apartment - modular home 1story, ramp to enter Has following equipment at home: Hemi walker, Wheelchair (manual), Shower bench, bed side commode, and Ramped entry  PLOF: Independent with basic ADLs and Leisure: "scraping"  PATIENT GOALS: To walk  Daughter reports he is not very motivated and only does things when he wants to. Daughter also concerned about depression and not eating much - therapist encouraged her to discuss with pt's physician  OBJECTIVE:   HAND DOMINANCE: Right  ADLs:  Transfers/ambulation related to ADLs: CGA to min assist Eating: assist to cut food, pt feeds self but food is softened Grooming: pt brushes hair, never brushed teeth (pt only has 4 remaining teeth) UB Dressing: mod assist LB Dressing: max assist to dependent (occasionally does Rt shoe w/ velcro)  Toileting: incontinent, wears adult diaper sometimes, uses urinal, rarely uses toilet Bathing: sponge bathing - dependent  Tub Shower transfers: pt/daughter has only attempted once Equipment: Transfer tub bench  IADLs: daughter does all IADLS Shopping: dep but pt does  sometimes go with daughter Light housekeeping: dep Meal Prep: dep  Community mobility: dep Medication management: dep Financial management: dep Handwriting:  pt approx 90% legible, dtr reports pretty close to before just a bit shaky  MOBILITY STATUS:  uses w/c  POSTURE COMMENTS:  rounded shoulders, forward head, and posterior pelvic tilt   ACTIVITY  TOLERANCE: Activity tolerance: limited by motivation    UPPER EXTREMITY ROM:  RUE AROM WFL's - pt has DuPuytren's Rt small finger  LUE: No A/ROM.  Pt also limited P/ROM - 50% composite finger, 75% wrist extension and elbow flexion, can only tolerate approx 80* passive sh flexion d/t pain   HAND FUNCTION: Pt has no hand function Lt side  COORDINATION: No movement/coordination Lt hand  SENSATION: Daughter reports Lt thumb absent prior to stroke, but rest of hand appears intact  EDEMA: mild at time of evaluation  MUSCLE TONE: LUE: Flaccid  COGNITION: Overall cognitive status:  HOH which affects ability to follow commands, but pt can follow 1 step commands when he hears it  VISION: Subjective report: Pt reports vision is blurry at times (mostly first thing in morning and at night) but was good before stroke Baseline vision: Wears glasses all the time Visual history: corrective eye surgery for cataracts  VISION ASSESSMENT: Not tested  Patient has difficulty with following activities due to following visual impairments: Reading, watching smaller TV  PERCEPTION: Not tested  PRAXIS: Not tested  OBSERVATIONS: arrives in w/c and sling LUE, 1" finger width subluxation Lt shoulder, elevated Lt scapula w/ minimal scapula elevation, 50% retraction, pain at neck and Lt shoulder at times, Lt hand began shaking at end of evaluation - ? clonus   TODAY'S TREATMENT:                                                                                                                               Pt's daughter very concerned about patient not eating much - ? Failure to thrive Recommended discussing concerns with MD and recommended questions to ask MD. Pt reports some difficulty swallowing and recommended Modified Barium Swallow. Also reports lack of taste and recommending discussing with MD possible causes. Also concerned about depression and recommended psychology referral.   Pt issued P/ROM  HEP for LUE - See pt instructions for details. Pt demo each with mod cueing   PATIENT EDUCATION: Education details: HEP, concerns about not eating Person educated: Patient and daughter (pt's caregiver)  Education method: Explanation, demo, verbal cues, handouts Education comprehension: verbalized understanding, return demo with cues  HOME EXERCISE PROGRAM: 01/07/23: HEP    GOALS: Goals reviewed with patient? Yes  SHORT TERM GOALS: Target date: 01/31/23  Pt/caregiver Independent with stretches for LUE Baseline: Goal status: in progress  2.  Pt to don shirt with min assist or less Baseline: max assist Goal status: INITIAL  3.  Pt to demo use of rocker knife to cut food Baseline:  Goal status: INITIAL  4.  Pt to participate in 50% of sponge bathing Baseline:  Goal status: INITIAL  5.  Pt/caregiver to verbalize understanding of LUE positioning to prevent pain and reduce edema Lt hand Baseline:  Goal status: INITIAL    LONG TERM GOALS: Target date: 03/03/23  Independent with updated HEP prn Baseline:  Goal status: INITIAL  2.  Pt to don/doff shirt mod I level  Baseline:  Goal status: INITIAL  3.  Pt to perform toilet transfers consistently with min assist for clothes management Baseline:  Goal status: INITIAL  4.  Pt to perform LB dressing with mod assist Baseline:  Goal status: INITIAL  5.  Pt to perform 75% sponge bathing with A/E prn  Baseline:  Goal status: INITIAL    ASSESSMENT:  CLINICAL IMPRESSION: Patient returns today for first treatment after evaluation - established initial HEP and discussed pt's daughters concerns with pt not eating.   PERFORMANCE DEFICITS: in functional skills including ADLs, IADLs, coordination, dexterity, edema, tone, ROM, strength, pain, Fine motor control, Gross motor control, hearing, mobility, balance, body mechanics, endurance, decreased knowledge of use of DME, and UE functional use, cognitive skills including  energy/drive, and psychosocial skills including habits.   IMPAIRMENTS: are limiting patient from ADLs, IADLs, and leisure.   CO-MORBIDITIES: may have co-morbidities  that affects occupational performance. Patient will benefit from skilled OT to address above impairments and improve overall function.  MODIFICATION OR ASSISTANCE TO COMPLETE EVALUATION: Min-Moderate modification of tasks or assist with assess necessary to complete an evaluation.  OT OCCUPATIONAL PROFILE AND HISTORY: Problem focused assessment: Including review of records relating to presenting problem.  CLINICAL DECISION MAKING: Moderate - several treatment options, min-mod task modification necessary  REHAB POTENTIAL: Fair decreased motivation, ? depression  EVALUATION COMPLEXITY: Low    PLAN:  OT FREQUENCY: 2x/week  OT DURATION: 8 weeks (plus eval) - however may d/c sooner if pt not improving d/t poor carryover of recommendations at home  PLANNED INTERVENTIONS: self care/ADL training, therapeutic exercise, therapeutic activity, neuromuscular re-education, manual therapy, passive range of motion, functional mobility training, splinting, electrical stimulation, ultrasound, paraffin, fluidotherapy, moist heat, patient/family education, visual/perceptual remediation/compensation, energy conservation, coping strategies training, and DME and/or AE instructions  RECOMMENDED OTHER SERVICES: none at this time  CONSULTED AND AGREED WITH PLAN OF CARE: Patient and family member/caregiver  PLAN FOR NEXT SESSION: review P/ROM HEP for LUE, work on [employer], OT 01/07/2023, 10:26 AM

## 2023-01-07 NOTE — Therapy (Signed)
OUTPATIENT PHYSICAL THERAPY NEURO TREATMENT   Patient Name: Daniel [name] MRN: [id number] DOB:09-09-1946, 77 y.o., male Today's Date: 01/07/2023  PCP: Lind Covert, MD REFERRING PROVIDER: Suzzanne Cloud, NP  END OF SESSION:   PT End of Session - 01/07/23 0936     Visit Number 2    Number of Visits 17    Date for PT Re-Evaluation 03/08/23   pushed out due to scheduling delay   Authorization Type [employer] MEDICARE    Progress Note Due on Visit 10    PT Start Time 0935    PT Stop Time 1015    PT Time Calculation (min) 40 min    Equipment Utilized During Treatment Gait belt    Activity Tolerance Other (comment);Patient tolerated treatment well   Pt is hard of hearing bilaterally, has hearing aides that he does not use.   Behavior During Therapy [facility] for tasks assessed/performed;Anxious   Pt states he is nervous; fidgetting throughout assessment             Past Medical History:  Diagnosis Date   Hernia of fascia 2008   repair with many complications    Hypertension    Past Surgical History:  Procedure Laterality Date   APPENDECTOMY     HERNIA REPAIR     Patient Active Problem List   Diagnosis Date Noted   Lung nodule 11/28/2022   Anemia 11/27/2022   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 ([place]) 07/24/2022   Dizziness 07/19/2022   CVA (cerebral vascular accident) ([place]) 07/19/2022   Prediabetes 07/19/2022   Memory change 01/06/2019   Falling 11/21/2016   Essential hypertension 09/27/2016   DJD (degenerative joint disease) 09/27/2016   Stasis dermatitis 09/27/2016    ONSET DATE: 07/19/2022 (date of CVA)  REFERRING DIAG: I63.81 (ICD-10-CM) - 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 ([place])  THERAPY DIAG:  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 (HCC)  Unsteadiness on feet  Hemiplegia and hemiparesis following cerebral infarction affecting left non-dominant side (HCC)  Muscle weakness (generalized)  Other  abnormalities of gait and mobility  Rationale for Evaluation and Treatment: Rehabilitation  SUBJECTIVE:                                                                                                                                                                                             SUBJECTIVE STATEMENT: Pt's daughter reports he had a flu/stomach bug since last visit, feeling better now though. Pt reports no falls since last visit. Pt reports he is having some pain in his L thumb from arm sling. Adjusted sling for pt and encouraged  him to discuss use of sling with OT.  Pt accompanied by: family member-daughter  PERTINENT HISTORY: HTN, right BG CVA (Sept 2023), lung nodule (being followed by pulmonology), repeated falls, memory changes  PAIN:  Are you having pain? Yes: NPRS scale: 2-3/10 Pain location: behind shoulders Pain description: achy Aggravating factors: forward bent head/neck and sitting too long Relieving factors: unsure  PRECAUTIONS: Fall  WEIGHT BEARING RESTRICTIONS: No  FALLS: Has patient fallen in last 6 months? Yes. Number of falls 1-getting up to go to the bedside commode w/o shoes on and daughter assisted his slide to the floor, unharmed  LIVING ENVIRONMENT: Lives with: lives with their family Lives in: House/apartment Stairs: No-have ramped entry Has following equipment at home: Hemi walker, Wheelchair (manual), shower chair, bed side commode, Ramped entry, and adult diapers (pt prefers bowel movements w/ these vs bedside commode) and occasional sponge bath vs showering  PLOF: Requires assistive device for independence, Needs assistance with ADLs, Needs assistance with homemaking, Needs assistance with gait, and Needs assistance with transfers  PATIENT GOALS: "I want to start walking as soon as I can!"  OBJECTIVE:   DIAGNOSTIC FINDINGS: MRI BRAIN 07/23/2022: IMPRESSION: 1. Increased size of acute infarct involving the right corona radiata and basal  ganglia. 2. 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 3. Newly abnormal appearance of the nondominant distal right vertebral artery which may reflect slow flow or occlusion.  COGNITION: Overall cognitive status: History of cognitive impairments - at baseline and daughter reports potential ST needs-drooling, denying food due to swallowing/chewing difficulties, memory changes   SENSATION: WFL  COORDINATION: Not formally assessed.  EDEMA:  Mild edema noted from just above left knee down to ankle.  MUSCLE TONE: none noted in LLE, limited assessment.  POSTURE: rounded shoulders, forward head, flexed trunk , and severe forward head in seated/standing  LOWER EXTREMITY ROM:     Passive  Right Eval Left Eval  Hip flexion    Hip extension    Hip abduction    Hip adduction    Hip internal rotation    Hip external rotation    Knee flexion    Knee extension  Lacking 5-10 degrees of extension actively, tight to PROM into extension w/ pain  Ankle dorsiflexion    Ankle plantarflexion    Ankle inversion    Ankle eversion     (Blank rows = not tested)   TODAY'S TREATMENT:         THER ACT:  [facility] PT Assessment - 01/07/23 0950       Standardized Balance Assessment   Standardized Balance Assessment Berg Balance Test;Timed Up and Go Test      Berg Balance Test   Sit to Stand Needs minimal aid to stand or to stabilize    Standing Unsupported Needs several tries to stand 30 seconds unsupported    Sitting with Back Unsupported but Feet Supported on Floor or Stool Able to sit safely and securely 2 minutes    Stand to Sit Controls descent by using hands    Transfers Needs one person to assist    Standing Unsupported with Eyes Closed Able to stand 3 seconds    Standing Unsupported with Feet Together Needs help to attain position but able to stand for 30 seconds with feet together    From Standing, Reach Forward with Outstretched Arm Loses balance  while trying/requires external support    From Standing Position, Pick up Object from Floor Unable to try/needs assist  to keep balance    From Standing Position, Turn to Look Behind Over each Shoulder Needs assist to keep from losing balance and falling    Turn 360 Degrees Needs assistance while turning    Standing Unsupported, Alternately Place Feet on Step/Stool Needs assistance to keep from falling or unable to try    Standing Unsupported, One Foot in [place] balance while stepping or standing    Standing on One Leg Unable to try or needs assist to prevent fall    Total Score 13    Berg comment: 13/56, high fall risk      Timed Up and Go Test   TUG Normal TUG    Normal TUG (seconds) 126   with HW and min A            THER EX: Initiated HEP sit to stand x 5 reps seated L hamstring stretch x 30 sec  Added to HEP, see bolded below   PATIENT EDUCATION: Education details: initiated HEP, OM and results Person educated: Patient and Child(ren) Education method: Explanation and Handouts Education comprehension: verbalized understanding and needs further education  HOME EXERCISE PROGRAM: Access Code: [email or web address] URL: [email or web address] Date: 01/07/2023 Prepared by: Excell Seltzer  Exercises - Sit to Stand with Counter Support  - 1 x daily - 7 x weekly - 3 sets - 5 reps - Seated Hamstring Stretch  - 1 x daily - 7 x weekly - 1 sets - 5 reps - 30 sec hold  GOALS: Goals reviewed with patient? Yes  SHORT TERM GOALS: Target date: 02/01/2023  Pt will be compliant to strength and balance HEP with supervision from family. Baseline:  To be established. Goal status: INITIAL  2.  Pt will improve normal TUG to less than or equal to 90 seconds for improved functional mobility and decreased fall risk. Baseline: 126 sec with HW and min A (2/19) Goal status: INITIAL  3.  Pt will improve Berg score to 19/56 for decreased fall risk Baseline: 13/56 (2/19) Goal status:  INITIAL  4.  Pt will ambulate >/=100 feet with LRAD and contact guard level of assist to promote household access. Baseline: 8' w/ HW and CGA +2 w/c follow Goal status: INITIAL  LONG TERM GOALS: Target date: 03/01/2023  FOTO to be assessed w/ LTG updated as appropriate. Baseline: To be assessed. Goal status: INITIAL  2.   Pt will improve normal TUG to less than or equal to 60 seconds for improved functional mobility and decreased fall risk. Baseline: 126 sec with HW and min A (2/19) Goal status: INITIAL  3.  Pt will improve Berg score to 25/56 for decreased fall risk Baseline: 13/56 (2/19) Goal status: INITIAL  4.  Pt will ambulate >/=250 feet with LRAD and contact guard level of assist to promote household and limited community access. Baseline: 8' w/ HW CGA and +2 w/c follow Goal status: INITIAL  5.  Pt will perform rolling left and right and supine to and from sit at supervision level to promote improved independence with bed mobility. Baseline: minA all bed mobility Goal status: INITIAL  ASSESSMENT:  CLINICAL IMPRESSION: Emphasis of skilled PT session on assessing TUG and Berg to determine fall risk and set STG/LTG as well as initiating HEP for LE ROM and strengthening. Pt's scores of 126 sec on the TUG and 13/56 on the Berg indicate a high fall risk, reviewed score and functional implications with patient and his family. Initiated HEP of L hamstring stretch due to ongoing  tightness in this joint as well as sit to stands to work on functional strength. Pt continues to benefit from skilled therapy services to address ongoing L hemibody weakness, decreased balance, and decreased safety and independence with functional mobility leading to increased fall risk. Continue POC.    OBJECTIVE IMPAIRMENTS: Abnormal gait, decreased activity tolerance, decreased balance, decreased cognition, decreased endurance, decreased knowledge of condition, decreased knowledge of use of DME, decreased  ROM, decreased strength, decreased safety awareness, increased edema, impaired UE functional use, improper body mechanics, and postural dysfunction.   ACTIVITY LIMITATIONS: carrying, lifting, bending, standing, stairs, transfers, bed mobility, and locomotion level  PARTICIPATION LIMITATIONS: meal prep, cleaning, laundry, medication management, personal finances, driving, community activity, occupation, and yard work  PERSONAL FACTORS: Age, Behavior pattern, Fitness, Time since onset of injury/illness/exacerbation, and 1-2 comorbidities: memory decline and lung nodule being followed by pulmonology  are also affecting patient's functional outcome.   REHAB POTENTIAL: Fair See personal factors and PMH  CLINICAL DECISION MAKING: Evolving/moderate complexity  EVALUATION COMPLEXITY: Moderate  PLAN:  PT FREQUENCY: 2x/week  PT DURATION: 8 weeks  PLANNED INTERVENTIONS: Therapeutic exercises, Therapeutic activity, Neuromuscular re-education, Balance training, Gait training, Patient/Family education, Self Care, Stair training, Vestibular training, Orthotic/Fit training, DME instructions, Manual therapy, and Re-evaluation  PLAN FOR NEXT SESSION: Assess FOTO-set goals.  Add to HEP for standing balance and functional LE strength.  Ambulation w/ HW.  What orthotic does pt have?   Excell Seltzer, PT, DPT, CSRS 01/07/2023, 10:30 AM

## 2023-01-09 ENCOUNTER — Ambulatory Visit: Payer: Medicare HMO | Admitting: Occupational Therapy

## 2023-01-09 ENCOUNTER — Ambulatory Visit: Payer: Medicare HMO | Admitting: Physical Therapy

## 2023-01-13 DIAGNOSIS — I639 Cerebral infarction, unspecified: Secondary | ICD-10-CM | POA: Diagnosis not present

## 2023-01-14 ENCOUNTER — Ambulatory Visit: Payer: Medicare HMO | Admitting: Physical Therapy

## 2023-01-14 ENCOUNTER — Ambulatory Visit: Payer: Medicare HMO | Admitting: Occupational Therapy

## 2023-01-14 ENCOUNTER — Ambulatory Visit: Payer: Medicare HMO | Admitting: Speech Pathology

## 2023-01-14 DIAGNOSIS — I639 Cerebral infarction, unspecified: Secondary | ICD-10-CM | POA: Diagnosis not present

## 2023-01-15 ENCOUNTER — Ambulatory Visit: Payer: Medicare HMO | Admitting: Family Medicine

## 2023-01-15 ENCOUNTER — Encounter: Payer: Self-pay | Admitting: Family Medicine

## 2023-01-15 ENCOUNTER — Ambulatory Visit (INDEPENDENT_AMBULATORY_CARE_PROVIDER_SITE_OTHER): Payer: Medicare HMO | Admitting: Family Medicine

## 2023-01-15 VITALS — BP 104/91 | HR 126 | Ht 70.0 in | Wt 140.4 lb

## 2023-01-15 DIAGNOSIS — R634 Abnormal weight loss: Secondary | ICD-10-CM

## 2023-01-15 DIAGNOSIS — I10 Essential (primary) hypertension: Secondary | ICD-10-CM

## 2023-01-15 DIAGNOSIS — I639 Cerebral infarction, unspecified: Secondary | ICD-10-CM

## 2023-01-15 MED ORDER — ONDANSETRON 8 MG PO TBDP: 8.0000 mg | ORAL_TABLET | Freq: Three times a day (TID) | ORAL | 1 refills | Status: DC | PRN

## 2023-01-15 NOTE — Patient Instructions (Signed)
Good to see you today - Thank you for coming in  Things we discussed today:  Appetite - Increase the zoloft to 100 mg - 2 tabs every night - Try the zofran if nauseated - eat whatever you want - go to Physical Therapy every time   Please always bring your medication bottles  Come back to see me in 6 weeks

## 2023-01-15 NOTE — Progress Notes (Unsigned)
[place]   SUBJECTIVE:   CHIEF COMPLAINT / HPI:   Ambulation after CVA Not much progress.  Has gone to a few outpt Physical Therapy appts but also missed several when did not feel like going     Weight His appetite remains poor.  Rarely fnishing his portions.  Sometimes feels nausea after or before eating but usually just does not feel like eating.  No problems with food sticking or heart burn Has not seen much difference with the sertraline which he reports he takes daily   Lung Nodule Has not had CT yet    OBJECTIVE:   BP (!) 104/91   Pulse (!) 126   Ht '5\' 10"'$  (1.778 m)   Wt 140 lb 6.4 oz (63.7 kg)   SpO2 98%   BMI 20.15 kg/m   Alert interactive sitting in WC  Appears discouraged Heart - Regular rate and rhythm.  No murmurs, gallops or rubs.    Lungs:  Normal respiratory effort, chest expands symmetrically. Lungs are clear to auscultation, no crackles or wheezes. Abdomen: soft and non-tender without masses, organomegaly or hernias noted.  No guarding or rebound No edema    ASSESSMENT/PLAN:   Cerebrovascular accident (CVA), unspecified mechanism ([place]) Assessment & Plan: Encouraged to participate with Physical Therapy.  Blood pressure is well controlled and taking statin and asa   Essential hypertension Assessment & Plan: Well controlled.  Continue current medications    Weight loss Assessment & Plan: Worsening.  Consistent with poor intake due to depression and post stroke changes.  Encouraged Physical Therapy, try a variety of foods, increase sertraline to 100 mg daily and as needed zofran if nausea.  Follow closely.  If not improving consider changing antidepressant    Other orders -     Ondansetron; Take 1 tablet (8 mg total) by mouth every 8 (eight) hours as needed for nausea or vomiting.  Dispense: 30 tablet; Refill: 1     Patient Instructions  Good to see you today - Thank you for coming in  Things we discussed today:  Appetite - Increase the zoloft  to 100 mg - 2 tabs every night - Try the zofran if nauseated - eat whatever you want - go to Physical Therapy every time   Please always bring your medication bottles  Come back to see me in 6 weeks    Lind Covert, [place]

## 2023-01-16 ENCOUNTER — Telehealth: Payer: Self-pay

## 2023-01-16 DIAGNOSIS — R634 Abnormal weight loss: Secondary | ICD-10-CM | POA: Insufficient documentation

## 2023-01-16 NOTE — Telephone Encounter (Signed)
Called patient's daughter Levada Dy and informed her of upcoming appointment.  CT Chest w/o contrast [facility]  01/22/2023 @ 1800 Arrival @ 1730  .Ozella Almond, CMA

## 2023-01-16 NOTE — Assessment & Plan Note (Signed)
Encouraged to participate with Physical Therapy.  Blood pressure is well controlled and taking statin and asa

## 2023-01-16 NOTE — Assessment & Plan Note (Signed)
Well controlled. Continue current medications  

## 2023-01-16 NOTE — Assessment & Plan Note (Signed)
Worsening.  Consistent with poor intake due to depression and post stroke changes.  Encouraged Physical Therapy, try a variety of foods, increase sertraline to 100 mg daily and as needed zofran if nausea.  Follow closely.  If not improving consider changing antidepressant

## 2023-01-21 ENCOUNTER — Ambulatory Visit: Payer: Medicare HMO | Admitting: Speech Pathology

## 2023-01-21 ENCOUNTER — Ambulatory Visit: Payer: Medicare HMO | Attending: Neurology | Admitting: Physical Therapy

## 2023-01-21 DIAGNOSIS — R2689 Other abnormalities of gait and mobility: Secondary | ICD-10-CM

## 2023-01-21 DIAGNOSIS — I69354 Hemiplegia and hemiparesis following cerebral infarction affecting left non-dominant side: Secondary | ICD-10-CM | POA: Diagnosis not present

## 2023-01-21 DIAGNOSIS — R1311 Dysphagia, oral phase: Secondary | ICD-10-CM

## 2023-01-21 DIAGNOSIS — I6381 Other cerebral infarction due to occlusion or stenosis of small artery: Secondary | ICD-10-CM

## 2023-01-21 DIAGNOSIS — R498 Other voice and resonance disorders: Secondary | ICD-10-CM | POA: Diagnosis not present

## 2023-01-21 DIAGNOSIS — M6281 Muscle weakness (generalized): Secondary | ICD-10-CM | POA: Diagnosis not present

## 2023-01-21 DIAGNOSIS — R2681 Unsteadiness on feet: Secondary | ICD-10-CM

## 2023-01-21 DIAGNOSIS — R41841 Cognitive communication deficit: Secondary | ICD-10-CM | POA: Insufficient documentation

## 2023-01-21 NOTE — Therapy (Signed)
OUTPATIENT SPEECH LANGUAGE PATHOLOGY SWALLOW EVALUATION   Patient Name: Daniel [name] MRN: [id number] DOB:1946/10/12, 77 y.o., male Today's Date: 01/21/2023  PCP: Lind Covert, MD REFERRING PROVIDER: Lind Covert, MD  END OF SESSION:  End of Session - 01/21/23 1014     Visit Number 1    Authorization Type [employer]    SLP Start Time 0930    SLP Stop Time  [id number]    SLP Time Calculation (min) 45 min    Activity Tolerance Patient tolerated treatment well             Past Medical History:  Diagnosis Date   Hernia of fascia 2008   repair with many complications    Hypertension    Past Surgical History:  Procedure Laterality Date   APPENDECTOMY     HERNIA REPAIR     Patient Active Problem List   Diagnosis Date Noted   Weight loss 01/16/2023   Lung nodule 11/28/2022   Anemia 11/27/2022   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 ([place]) 07/24/2022   CVA (cerebral vascular accident) ([place]) 07/19/2022   Prediabetes 07/19/2022   Memory change 01/06/2019   Essential hypertension 09/27/2016   DJD (degenerative joint disease) 09/27/2016   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 right-handed male with history of hypertension as well as tobacco use..  Per chart review lives with daughter. Independent prior to admission.  Presented 07/19/2022 with left-sided weakness and dizziness of acute onset.   No nausea vomiting or fever reported.  Blood pressure 205/120.  CT/MRI showed small acute infarct right centrum semiovale.  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  MRA showed no large vessel occlusion.  Admission chemistries unremarkable alcohol negative urine drug screen negative.  Echocardiogram with ejection fraction of 55 to 60% no wall motion abnormalities grade 1 diastolic dysfunction.  Neurology follow-up placed on aspirin and Plavix for CVA prophylaxis.  Hospital course increasing left-sided weakness 07/23/2022 MRI completed showing increased size of acute infarct involving the right corona radiata and basal ganglia.  MRI findings discussed with neurology to specific changes recommended remain on low-dose aspirin Plavix x3 weeks followed by aspirin alone.  Lovenox for DVT prophylaxis.  Monitoring of permissive hypertension patient on losartan prior to admission.  Tolerating a regular diet.  Patient nonspecific chest pain while eating breakfast troponin negative EKG mild sinus bradycardia.  No further work-up indicated.  Therapy evaluations completed due to patient decreased functional mobility was admitted for a comprehensive rehab program.    PAIN:  Are you having pain? Yes: Pain location: "My butt hurts", foot (daughter reports pt at risk of developing bed sore)  FALLS: Has patient fallen in last 6 months?  See PT evaluation for details  LIVING ENVIRONMENT: Lives with: lives with their family Lives in: House/apartment  PLOF:  Level of assistance: Needed assistance with ADLs, Needed assistance with IADLS Employment: Retired  PATIENT GOALS: Gain weight, improve  appetite, be able to project voice  OBJECTIVE:   DIAGNOSTIC FINDINGS: 07/23/22 MRI Brain:  IMPRESSION: 1. Increased size of acute infarct involving the right corona radiata and basal ganglia. 2. 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 3. Newly abnormal appearance of the nondominant distal right vertebral artery which may reflect slow flow or occlusion.  COGNITION: Overall cognitive status: Impaired Areas of impairment:  Memory: Impaired: Working Industrial/product designer term Prospective Awareness: Impaired: Comment: Pt and daughter with conflicting reports on memory, pt with reduced awareness  Functional deficits: Pt and family report that he does not recall details from conversations, forgets where he places common items (I.e. TV remote) and will leave TV running all night  ORAL MOTOR EXAMINATION: Overall status: Overall WFL Comments: two bottom teeth, otherwise edentulous. No dentures   CLINICAL SWALLOW ASSESSMENT:   Current diet: Dysphagia 1 (puree) and thin liquids Dentition: edentulous Patient directly observed with POs: Yes: regular, dysphagia 1 (puree), thin liquids, and mixed (fruit cup)  Feeding: able to feed self with A to stabilize food items on table Liquids provided by: cup Oral phase signs and symptoms:  Difficulty chewing d/t edentulism  Pharyngeal phase signs and symptoms: No overt s/sx aspiration   PATIENT REPORTED OUTCOME MEASURES (PROM): EAT-10: 13/36 "Swallowing solids takes extra effort"- pt responded this is a severe problem; "my swallowing problem has caused me to lose weight"- daughter reported this is a severe problem, whereas patient responded sometimes   PERCEPTUAL SPEECH/VOICE EVALUATION:    Baseline conversational volume average: 62 dB  Baseline conversational volume range: 60 dB - 68 dB  Stimulable? Yes  Volume after model provided: 75 dB  Vocal quality: Low volume, hoarseness (slightly resolved with increased volume), intermittent aphonia with pitch breaks  Comment: Reduced breath support    TODAY'S TREATMENT:                                                                                                                                         DATE:   01/21/23: Evaluation completed this  date. Educated pt on importance of getting out of bed to avoid bed sores and maintain cognition. Provided education concerning importance of increasing water intake to reduce dry mouth, improve taste, impact on overall health. Pt has not received dietician consult.   PATIENT EDUCATION: Education details: See above Person educated: Patient and daughter Education method: Explanation Education comprehension: verbalized understanding and needs further education   ASSESSMENT:  CLINICAL IMPRESSION: Patient is a 77 y.o. male who was seen today for dysphagia and cognitive-communication evaluation. Today he presents with moderate cognitive impairments, moderate hypokinetic dysarthria, mild oropharyngeal dysphagia. Pt and daughter report that pt currently tolerates Dysphagia 1 diet (mashed potatoes, beans). He does not eat meat and demonstrated failure to thrive d/t to diminished appetite. Pt reports no odynophagia. Experiences xerostomia (severely reduced water intake), changes in taste, nausea before and after  meals, reduced intake, increased difficulty chewing, and significant weight loss since stroke in September 2023. Pt used to tolerate regular diet. Pt and daughter also endorse changes in memory, such as forgetting details from conversations and locations of common objects, and appointments/daily schedule. Memory impairment results in family consistently repeating information and appointments. Pt and daughter with conflicting reports on impact on deficits on pts' QoL demonstrating reduced awareness of impairments.  Pt presented with low volume averaging 62 dB. Demonstrates stimulability to increase volume for short periods of time. Intelligibility is reduced and daughter reports difficulty understanding her dad at home. Pt would benefit from skilled ST services to address oral dysphagia, dysphonia, and cognitive-communication deficits.   OBJECTIVE IMPAIRMENTS: include memory, voice disorder, and  dysphagia. These impairments are limiting patient from ADLs/IADLs and effectively communicating at home and in community. Factors affecting potential to achieve goals and functional outcome are cooperation/participation level. Patient will benefit from skilled SLP services to address above impairments and improve overall function.  REHAB POTENTIAL: Fair (Motivation, participation)    GOALS: Goals reviewed with patient? Yes  SHORT TERM GOALS: Target date: 02-18-23  Pt will demonstrate use of dysarthria/dysphonia strategies and compensations to achieve 68 dB or higher during 3 minute conversation given mod-A.  Baseline: Goal status: INITIAL  2.  Pt will use external aids to recall appointments and daily schedule given occasional min A Baseline:  Goal status: INITIAL  3.  Pt will report tolerating  Dysphagia 2 diet across one week with 25% PO increase per family subjectively.  Baseline: Dysphagia 1 Goal status: INITIAL  4.  Pt will average 70dB with clear phonation 18/20 sentences with occasional min A Baseline:  Goal status: INITIAL    LONG TERM GOALS: Target date: 04-22-23  Pt will demonstrate use of dysarthria/dysphonia strategies and compensations to achieve 70 dB or higher during 8 minute conversation given min-A.  Baseline:  Goal status: INITIAL  2.  Pt will report implementation of external memory aids in to recall appointments, pertinent information over two weeks, given min-A .  Goal status: INITIAL  3.  Pt will report tolerating occasional Dysphagia 3 diet and thin liquids across two weeks with 50% increase in PO intake subjectively per family  Baseline:  Goal status: INITIAL  4.  MBSS vs FEES as indicated Baseline:  Goal status: INITIAL  5.  Pt will be intelligible over 8 minute conversation in mildly noisy environment with rare min A Baseline:  Goal status: INTIAL  6. Improve score EAT-10 by 3 points             Goal status: INITIAL  PLAN:  SLP FREQUENCY:  2x/week  SLP DURATION: 12 weeks  PLANNED INTERVENTIONS: Aspiration precaution training, Pharyngeal strengthening exercises, Diet toleration management , Environmental controls, Trials of upgraded texture/liquids, Cueing hierachy, Cognitive reorganization, Internal/external aids, Functional tasks, Multimodal communication approach, SLP instruction and feedback, Compensatory strategies, Patient/family education, and MBSS or FEES    Leroy Libman, Student-SLP 01/21/2023, 10:19 AM

## 2023-01-21 NOTE — Therapy (Addendum)
OUTPATIENT PHYSICAL THERAPY NEURO TREATMENT   Patient Name: Daniel [name] MRN: [id number] DOB:[birth year], 77 y.o., male Today's Date: 01/21/2023  PCP: Lind Covert, MD REFERRING PROVIDER: Suzzanne Cloud, NP  END OF SESSION:   PT End of Session - 01/21/23 0848     Visit Number 3    Number of Visits 17    Date for PT Re-Evaluation 03/08/23   pushed out due to scheduling delay   Authorization Type [employer] MEDICARE    Progress Note Due on Visit 10    PT Start Time 0845    PT Stop Time 0927    PT Time Calculation (min) 42 min    Equipment Utilized During Treatment Gait belt    Activity Tolerance Other (comment);Patient tolerated treatment well   Pt is hard of hearing bilaterally, has hearing aides that he does not use.   Behavior During Therapy [facility] for tasks assessed/performed               Past Medical History:  Diagnosis Date   Hernia of fascia 2008   repair with many complications    Hypertension    Past Surgical History:  Procedure Laterality Date   APPENDECTOMY     HERNIA REPAIR     Patient Active Problem List   Diagnosis Date Noted   Weight loss 01/16/2023   Lung nodule 11/28/2022   Anemia 11/27/2022   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 ([place]) 07/24/2022   CVA (cerebral vascular accident) ([place]) 07/19/2022   Prediabetes 07/19/2022   Memory change 01/06/2019   Essential hypertension 09/27/2016   DJD (degenerative joint disease) 09/27/2016   Stasis dermatitis 09/27/2016    ONSET DATE: 07/19/2022 (date of CVA)  REFERRING DIAG: I63.81 (ICD-10-CM) - 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 ([place])  THERAPY DIAG:  Hemiplegia and hemiparesis following cerebral infarction affecting left non-dominant side (HCC)  Unsteadiness on feet  Muscle weakness (generalized)  Other abnormalities of gait and mobility  Rationale for Evaluation and Treatment: Rehabilitation  SUBJECTIVE:                                                                                                                                                                                              SUBJECTIVE STATEMENT: Pt reports no falls or acute changes since last visit. Pt reports he is having soreness in his butt, daughter has put a protective dressing on it due to possible start of pressure sore. Pt's appetite is slightly improving, has been able to eat a little bit of cereal in the morning and ate half a biscuit today. Per daughter's report pt  has been refusing to get out of bed due to fatigue/not feeling well and stayed in bed for 3 days in a row despite encouragement to get up.  Pt accompanied by: family member-daughter  PERTINENT HISTORY: HTN, right BG CVA (Sept 2023), lung nodule (being followed by pulmonology), repeated falls, memory changes  PAIN:  Are you having pain? Yes: NPRS scale: 2-3/10 Pain location: behind shoulders Pain description: achy Aggravating factors: forward bent head/neck and sitting too long Relieving factors: unsure  PRECAUTIONS: Fall  WEIGHT BEARING RESTRICTIONS: No  FALLS: Has patient fallen in last 6 months? Yes. Number of falls 1-getting up to go to the bedside commode w/o shoes on and daughter assisted his slide to the floor, unharmed  LIVING ENVIRONMENT: Lives with: lives with their family Lives in: House/apartment Stairs: No-have ramped entry Has following equipment at home: Hemi walker, Wheelchair (manual), shower chair, bed side commode, Ramped entry, and adult diapers (pt prefers bowel movements w/ these vs bedside commode) and occasional sponge bath vs showering  PLOF: Requires assistive device for independence, Needs assistance with ADLs, Needs assistance with homemaking, Needs assistance with gait, and Needs assistance with transfers  PATIENT GOALS: "I want to start walking as soon as I can!"  OBJECTIVE:   DIAGNOSTIC FINDINGS: MRI BRAIN 07/23/2022: IMPRESSION: 1. Increased size of acute  infarct involving the right corona radiata and basal ganglia. 2. 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 3. Newly abnormal appearance of the nondominant distal right vertebral artery which may reflect slow flow or occlusion.  COGNITION: Overall cognitive status: History of cognitive impairments - at baseline and daughter reports potential ST needs-drooling, denying food due to swallowing/chewing difficulties, memory changes   SENSATION: WFL  COORDINATION: Not formally assessed.  EDEMA:  Mild edema noted from just above left knee down to ankle.  MUSCLE TONE: none noted in LLE, limited assessment.  POSTURE: rounded shoulders, forward head, flexed trunk , and severe forward head in seated/standing  LOWER EXTREMITY ROM:     Passive  Right Eval Left Eval  Hip flexion    Hip extension    Hip abduction    Hip adduction    Hip internal rotation    Hip external rotation    Knee flexion    Knee extension  Lacking 5-10 degrees of extension actively, tight to PROM into extension w/ pain  Ankle dorsiflexion    Ankle plantarflexion    Ankle inversion    Ankle eversion     (Blank rows = not tested)   TODAY'S TREATMENT:         THER ACT: Education with patient regarding importance of getting out of bed to his wheelchair every day, even on days he is not feeling up to it. Also encouraged pt to work on standing up to his HW at least once per day to work on strengthening his legs but if he is strong enough to stand up every hour to relief pressure from his bottom that would be better for skin integrity. Also discussed repositioning in bed (to sidelying, for example) to avoid a pressure sore on his sacrum.  Initially attempted to have pt stand to [facility] from his w/c, pt unable to stand due to weakness. Squat pivot transfer to the R from w/c to mat table with max A due to pt inability to assist as much with transfer as in previous sessions. From elevated  mat table worked on sit to stand to [facility], pt initially unable to stand  but after seated rest break he is able to stand x 3 repetitions with mod A and L knee blocked. Pt only able to tolerate standing for about 10 sec before needing to sit due to fatigue. Pt then with increase in fatigue level and unable to even sit EOM unsupported, provided posterior trunk support to patient while his daughter set up w/c for transfer. Squat pivot transfer back to w/c with mod A to the R, pt able to assist more with transfer as he is able to use armrest to pull himself with RUE.  Discussed with pt the importance of consuming nutrition in order to have energy for physical activity as well as that this therapist and his daughter can provide encouragement but he needs to have the drive in order to improve and put in the work to get better/stronger. Pt in agreement by end of session to work on getting out of bed every day to his w/c and to work on standing up at least one time per day.   PATIENT EDUCATION: Education details: importance of getting out of bed and working on standing, importance of pressure relief, importance of consuming adequate nutrition Person educated: Patient and Child(ren) Education method: Explanation Education comprehension: verbalized understanding and needs further education  HOME EXERCISE PROGRAM: Access Code: [email or web address] URL: [email or web address] Date: 01/07/2023 Prepared by: Excell Seltzer  Exercises - Sit to Stand with Counter Support  - 1 x daily - 7 x weekly - 3 sets - 5 reps - Seated Hamstring Stretch  - 1 x daily - 7 x weekly - 1 sets - 5 reps - 30 sec hold  GOALS: Goals reviewed with patient? Yes  SHORT TERM GOALS: Target date: 02/01/2023  Pt will be compliant to strength and balance HEP with supervision from family. Baseline:  To be established. Goal status: INITIAL  2.  Pt will improve normal TUG to less than or equal to 90 seconds for improved functional mobility  and decreased fall risk. Baseline: 126 sec with HW and min A (2/19) Goal status: INITIAL  3.  Pt will improve Berg score to 19/56 for decreased fall risk Baseline: 13/56 (2/19) Goal status: INITIAL  4.  Pt will ambulate >/=100 feet with LRAD and contact guard level of assist to promote household access. Baseline: 8' w/ HW and CGA +2 w/c follow Goal status: INITIAL  LONG TERM GOALS: Target date: 03/01/2023  FOTO to be assessed w/ LTG updated as appropriate. Baseline: To be assessed. Goal status: INITIAL  2.   Pt will improve normal TUG to less than or equal to 60 seconds for improved functional mobility and decreased fall risk. Baseline: 126 sec with HW and min A (2/19) Goal status: INITIAL  3.  Pt will improve Berg score to 25/56 for decreased fall risk Baseline: 13/56 (2/19) Goal status: INITIAL  4.  Pt will ambulate >/=250 feet with LRAD and contact guard level of assist to promote household and limited community access. Baseline: 8' w/ HW CGA and +2 w/c follow Goal status: INITIAL  5.  Pt will perform rolling left and right and supine to and from sit at supervision level to promote improved independence with bed mobility. Baseline: minA all bed mobility Goal status: INITIAL  ASSESSMENT:  CLINICAL IMPRESSION: Emphasis of skilled PT session on reassessing transfers and standing with patient as he has regressed from his previous session as well as discussing the importance of mobility at home in order to see an improvement in overall function. Pt requires increased  physical assist for both squat pivot transfers and standing to [facility] this date due to decreased global strength from spending several days in bed due to not feeling well. Extensive time spent educating pt on importance of getting out of bed and performing mobility daily to not lose any gains he has made in therapy. Pt continues to benefit from skilled therapy services to address ongoing global weakness as well as L  hemibody weakness leading to decreased safety and independence with functional mobility. Continue POC.   OBJECTIVE IMPAIRMENTS: Abnormal gait, decreased activity tolerance, decreased balance, decreased cognition, decreased endurance, decreased knowledge of condition, decreased knowledge of use of DME, decreased ROM, decreased strength, decreased safety awareness, increased edema, impaired UE functional use, improper body mechanics, and postural dysfunction.   ACTIVITY LIMITATIONS: carrying, lifting, bending, standing, stairs, transfers, bed mobility, and locomotion level  PARTICIPATION LIMITATIONS: meal prep, cleaning, laundry, medication management, personal finances, driving, community activity, occupation, and yard work  PERSONAL FACTORS: Age, Behavior pattern, Fitness, Time since onset of injury/illness/exacerbation, and 1-2 comorbidities: memory decline and lung nodule being followed by pulmonology  are also affecting patient's functional outcome.   REHAB POTENTIAL: Fair See personal factors and PMH  CLINICAL DECISION MAKING: Evolving/moderate complexity  EVALUATION COMPLEXITY: Moderate  PLAN:  PT FREQUENCY: 2x/week  PT DURATION: 8 weeks  PLANNED INTERVENTIONS: Therapeutic exercises, Therapeutic activity, Neuromuscular re-education, Balance training, Gait training, Patient/Family education, Self Care, Stair training, Vestibular training, Orthotic/Fit training, DME instructions, Manual therapy, and Re-evaluation  PLAN FOR NEXT SESSION: Assess FOTO-set goals.  Add to HEP for standing balance and functional LE strength. Standing tolerance and balance with HW, progressing to gait as safe and able. Pt had regression at 3/4 session due to being sick and spending a few days in bed; discuss possible transition to HHPT if pt and family agreeable?   Excell Seltzer, PT, DPT, CSRS 01/21/2023, 11:15 AM

## 2023-01-22 ENCOUNTER — Ambulatory Visit (HOSPITAL_COMMUNITY)
Admission: RE | Admit: 2023-01-22 | Discharge: 2023-01-22 | Disposition: A | Discharge status: Home / Self Care | Payer: Medicare HMO | Source: Ambulatory Visit | Attending: Family Medicine | Admitting: Family Medicine

## 2023-01-22 ENCOUNTER — Encounter: Payer: Self-pay | Admitting: Family Medicine

## 2023-01-22 DIAGNOSIS — R911 Solitary pulmonary nodule: Secondary | ICD-10-CM

## 2023-01-22 DIAGNOSIS — R918 Other nonspecific abnormal finding of lung field: Secondary | ICD-10-CM | POA: Diagnosis not present

## 2023-01-22 NOTE — Telephone Encounter (Signed)
Attempted to call daughter to discuss concern further. She did not answer, LVM asking her to return call to office to discuss symptoms further.   Talbot Grumbling, RN

## 2023-01-23 ENCOUNTER — Ambulatory Visit (INDEPENDENT_AMBULATORY_CARE_PROVIDER_SITE_OTHER): Payer: Medicare HMO | Admitting: Family Medicine

## 2023-01-23 VITALS — BP 100/81 | HR 111 | Temp 92.8°F

## 2023-01-23 DIAGNOSIS — R195 Other fecal abnormalities: Secondary | ICD-10-CM

## 2023-01-23 NOTE — Patient Instructions (Addendum)
Stop losartan Checking lab work today. Follow-up with Dr. Erin Hearing, please schedule an appointment with him in the next couple of weeks  Be well, Dr. Ardelia Mems

## 2023-01-23 NOTE — Progress Notes (Signed)
  Date of Visit: 01/23/2023   SUBJECTIVE:   HPI:  Gamalier presents today for a same day appointment to discuss dark stools.  Daughter accompanies him to the visit and provides the history.  She reports yesterday noting a dark stool in his diaper.  Also noted that his urine has been darker the last couple of days.  He has had decreased appetite and decreased p.o. intake chronically.  He does take aspirin for secondary prevention of stroke.  Reports he has had pain in his backside overlying his sacrum.  She has been applying a pad to offload it, as she saw signs of a developing bedsore.  She does not think he would be able to give [place] a urine sample today as he has not been drinking much.  PMHx: Hypertension, prior hernia repair, prior CVA August 2023, anemia, lung nodule, prediabetes, stasis dermatitis  OBJECTIVE:   BP 100/81   Pulse (!) 111   Temp (!) 92.8 F (33.8 C)   SpO2 98%  Gen: No acute distress, cooperative.  Appears chronically ill and weak HEENT: Normocephalic, atraumatic, moist mucous membranes Heart: Regular rate and rhythm, no murmur Lungs: Clear bilaterally, normal effort Abdomen: Soft, nontender to palpation, normoactive bowel sounds Neuro: Able to stand with much assistance from daughter Bottom: Examined with daughter in room.  Unable to see rectal area due to presence of dark brown stool in diaper.  Specimen obtained for FOBT testing.  Sacrum with mild discoloration, but no obvious skin breakdown.  I ran the FOBT test and it was negative.  ASSESSMENT/PLAN:   Dark stools Photo shown by daughter appears to be actually dark brown rather than black.  This is consistent with what I found in his diaper today.  FOBT negative.  We will check labs including CBC and CMET to ensure we are not missing blood loss in his bowel.  Ideally would have checked urinalysis given report of dark urine, but patient unable to produce specimen today.  He is generally debilitated and it appears  he has been losing weight over time.  Consider involvement of palliative care in the future.  Hypotension Blood pressure noted low today and at prior visits.  Stop losartan altogether.  Checking renal function today.  Follow-up with PCP in 1 week, advised to schedule at checkout.  [place]. Ardelia Mems, [place]

## 2023-01-23 NOTE — Telephone Encounter (Signed)
[name] daughter returns call to nurse line. Reports that stool is very dark, almost black. Urine is very dark as well.   Denies fever, abdominal or back pain. No new medications or changes in diet.  Reports that it is painful to have bowel movements. No noticeable blood in stool.   Daughter also reports decreased appetite.   Recommended that patient be evaluated today. Scheduled this afternoon at 2:30.  Talbot Grumbling, RN

## 2023-01-24 ENCOUNTER — Ambulatory Visit: Payer: Medicare HMO | Admitting: Physical Therapy

## 2023-01-24 ENCOUNTER — Encounter: Payer: Self-pay | Admitting: Physical Therapy

## 2023-01-24 ENCOUNTER — Other Ambulatory Visit: Payer: Self-pay | Admitting: Family Medicine

## 2023-01-24 DIAGNOSIS — M6281 Muscle weakness (generalized): Secondary | ICD-10-CM

## 2023-01-24 DIAGNOSIS — R2681 Unsteadiness on feet: Secondary | ICD-10-CM

## 2023-01-24 DIAGNOSIS — I69354 Hemiplegia and hemiparesis following cerebral infarction affecting left non-dominant side: Secondary | ICD-10-CM

## 2023-01-24 DIAGNOSIS — I6381 Other cerebral infarction due to occlusion or stenosis of small artery: Secondary | ICD-10-CM

## 2023-01-24 DIAGNOSIS — R2689 Other abnormalities of gait and mobility: Secondary | ICD-10-CM

## 2023-01-24 LAB — CMP14+EGFR
ALT: 22 IU/L (ref 0–44)
AST: 21 IU/L (ref 0–40)
Albumin/Globulin Ratio: 1.7 (ref 1.2–2.2)
Albumin: 4.1 g/dL (ref 3.8–4.8)
Alkaline Phosphatase: 118 IU/L (ref 44–121)
BUN/Creatinine Ratio: 27 — ABNORMAL HIGH (ref 10–24)
BUN: 23 mg/dL (ref 8–27)
Bilirubin Total: 0.8 mg/dL (ref 0.0–1.2)
CO2: 23 mmol/L (ref 20–29)
Calcium: 9.6 mg/dL (ref 8.6–10.2)
Chloride: 97 mmol/L (ref 96–106)
Creatinine, Ser: 0.85 mg/dL (ref 0.76–1.27)
Globulin, Total: 2.4 g/dL (ref 1.5–4.5)
Glucose: 148 mg/dL — ABNORMAL HIGH (ref 70–99)
Potassium: 3.5 mmol/L (ref 3.5–5.2)
Sodium: 140 mmol/L (ref 134–144)
Total Protein: 6.5 g/dL (ref 6.0–8.5)
eGFR: 89 mL/min/{1.73_m2} (ref >59)

## 2023-01-24 LAB — CBC
Hematocrit: 43.7 % (ref 37.5–51.0)
Hemoglobin: 13.8 g/dL (ref 13.0–17.7)
MCH: 27.4 pg (ref 26.6–33.0)
MCHC: 31.6 g/dL (ref 31.5–35.7)
MCV: 87 fL (ref 79–97)
Platelets: 279 10*3/uL (ref 150–450)
RBC: 5.04 x10E6/uL (ref 4.14–5.80)
RDW: 13.9 % (ref 11.6–15.4)
WBC: 9.2 10*3/uL (ref 3.4–10.8)

## 2023-01-24 NOTE — Therapy (Signed)
[facility] [address] [place], [place], [address] Phone: [phone]   Fax:  [phone]  Patient Details - ARRIVED NO CHARGE Name: Daniel [name] MRN: [id number] Date of Birth: 10-Mar-1946 Referring Provider:  Lind Covert, *  Encounter Date: 01/24/2023  Daughter initiates conversation regarding return to home health as pt has had recent decline and was asking about this option this morning.  He was sick a couple of weeks ago and has continued to struggle with depression and lack of appetite.  Physically he has regressed to bed level style activity and has been dealing with GI troubles in recent days.  He expresses interest in return to [facility] in session today.  Upon review of OPPT goals set from performance on evaluation, pt is not appropriate to assess goals today due to inability to perform STS which daughter states is an ongoing issue since sickness.  Pt will be discharged from OPPT if HHPT can be obtained otherwise may continue with visits as scheduled.  Bary Richard, PT, DPT 01/24/2023, 9:14 AM  [facility] [address] [place], [place], [address] Phone: [phone]   Fax:  [phone]

## 2023-01-28 ENCOUNTER — Ambulatory Visit: Payer: Medicare HMO | Admitting: Occupational Therapy

## 2023-01-28 ENCOUNTER — Ambulatory Visit: Payer: Medicare HMO | Admitting: Physical Therapy

## 2023-01-28 ENCOUNTER — Ambulatory Visit: Payer: Medicare HMO | Admitting: Speech Pathology

## 2023-01-29 ENCOUNTER — Encounter: Payer: Self-pay | Admitting: Occupational Therapy

## 2023-01-29 ENCOUNTER — Encounter: Payer: Self-pay | Admitting: Physical Therapy

## 2023-01-29 NOTE — Therapy (Signed)
[facility] [address] [place], [place], [address] Phone: [phone]   Fax:  [phone]  Patient Details  Name: Daniel [name] MRN: [id number] Date of Birth: [birth year] Referring Provider:  No ref. provider found  Encounter Date: 01/29/2023  Pt will be d/c from outpatient O.T. at this time as pt now has home health referrral. Pt/family is agreeable to this per discussion with P.T.   Hans Eden, OT 01/29/2023, 12:05 PM  [facility] [address] [place], [place], [address] Phone: [phone]   Fax:  [phone]

## 2023-01-29 NOTE — Therapy (Signed)
[facility] [address] [place], [place], [address] Phone: [phone]   Fax:  [phone]  Patient Details -DISCHARGE SUMMARY Name: Daniel [name] MRN: [id number] Date of Birth: 01-31-1946 Referring Provider:  No ref. provider found  Encounter Date: 01/29/2023  Please see last PT note regarding conversation with patient and daughter about discharge from [facility] to [facility].  Pt not appropriate for goals set on initial evaluation to be assessed at last visit as pt has regressed past his initial baseline.  Will discharge at this time.  Bary Richard, PT, DPT 01/29/2023, 12:20 PM  [facility] [address] [place], [place], [address] Phone: [phone]   Fax:  [phone]

## 2023-01-30 ENCOUNTER — Other Ambulatory Visit: Payer: Self-pay

## 2023-01-30 ENCOUNTER — Ambulatory Visit: Payer: Medicare HMO | Admitting: Occupational Therapy

## 2023-01-30 ENCOUNTER — Ambulatory Visit: Payer: Medicare HMO | Admitting: Speech Pathology

## 2023-01-30 ENCOUNTER — Telehealth (INDEPENDENT_AMBULATORY_CARE_PROVIDER_SITE_OTHER): Payer: Medicare HMO | Admitting: Family Medicine

## 2023-01-30 ENCOUNTER — Emergency Department (HOSPITAL_COMMUNITY): Payer: Medicare HMO

## 2023-01-30 ENCOUNTER — Telehealth: Payer: Self-pay | Admitting: Family Medicine

## 2023-01-30 ENCOUNTER — Ambulatory Visit: Payer: Medicare HMO | Admitting: Physical Therapy

## 2023-01-30 ENCOUNTER — Emergency Department (HOSPITAL_COMMUNITY)
Admission: EM | Admit: 2023-01-30 | Discharge: 2023-01-31 | Disposition: A | Discharge status: Home / Self Care | Payer: Medicare HMO | Attending: Emergency Medicine | Admitting: Emergency Medicine

## 2023-01-30 DIAGNOSIS — R9431 Abnormal electrocardiogram [ECG] [EKG]: Secondary | ICD-10-CM | POA: Diagnosis not present

## 2023-01-30 DIAGNOSIS — Z7982 Long term (current) use of aspirin: Secondary | ICD-10-CM | POA: Insufficient documentation

## 2023-01-30 DIAGNOSIS — R630 Anorexia: Secondary | ICD-10-CM | POA: Diagnosis not present

## 2023-01-30 DIAGNOSIS — R531 Weakness: Secondary | ICD-10-CM | POA: Insufficient documentation

## 2023-01-30 DIAGNOSIS — Z743 Need for continuous supervision: Secondary | ICD-10-CM | POA: Diagnosis not present

## 2023-01-30 DIAGNOSIS — I6381 Other cerebral infarction due to occlusion or stenosis of small artery: Secondary | ICD-10-CM

## 2023-01-30 DIAGNOSIS — R1013 Epigastric pain: Secondary | ICD-10-CM | POA: Diagnosis not present

## 2023-01-30 LAB — TYPE AND SCREEN
ABO/RH(D): A NEG
Antibody Screen: NEGATIVE

## 2023-01-30 LAB — CBC WITH DIFFERENTIAL/PLATELET
Abs Immature Granulocytes: 0.02 10*3/uL (ref 0.00–0.07)
Basophils Absolute: 0 10*3/uL (ref 0.0–0.1)
Basophils Relative: 0 %
Eosinophils Absolute: 0.1 10*3/uL (ref 0.0–0.5)
Eosinophils Relative: 1 %
HCT: 38.9 % — ABNORMAL LOW (ref 39.0–52.0)
Hemoglobin: 13.1 g/dL (ref 13.0–17.0)
Immature Granulocytes: 0 %
Lymphocytes Relative: 16 %
Lymphs Abs: 1.1 10*3/uL (ref 0.7–4.0)
MCH: 28.9 pg (ref 26.0–34.0)
MCHC: 33.7 g/dL (ref 30.0–36.0)
MCV: 85.9 fL (ref 80.0–100.0)
Monocytes Absolute: 0.4 10*3/uL (ref 0.1–1.0)
Monocytes Relative: 6 %
Neutro Abs: 5.3 10*3/uL (ref 1.7–7.7)
Neutrophils Relative %: 77 %
Platelets: 231 10*3/uL (ref 150–400)
RBC: 4.53 MIL/uL (ref 4.22–5.81)
RDW: 13.7 % (ref 11.5–15.5)
WBC: 6.9 10*3/uL (ref 4.0–10.5)
nRBC: 0 % (ref 0.0–0.2)

## 2023-01-30 LAB — URINALYSIS, W/ REFLEX TO CULTURE (INFECTION SUSPECTED)
Bilirubin Urine: NEGATIVE
Glucose, UA: NEGATIVE mg/dL
Hgb urine dipstick: NEGATIVE
Ketones, ur: 20 mg/dL — AB
Leukocytes,Ua: NEGATIVE
Nitrite: NEGATIVE
Protein, ur: 30 mg/dL — AB
Specific Gravity, Urine: 1.024 (ref 1.005–1.030)
pH: 5 (ref 5.0–8.0)

## 2023-01-30 LAB — COMPREHENSIVE METABOLIC PANEL
ALT: 22 U/L (ref 0–44)
AST: 14 U/L — ABNORMAL LOW (ref 15–41)
Albumin: 3 g/dL — ABNORMAL LOW (ref 3.5–5.0)
Alkaline Phosphatase: 82 U/L (ref 38–126)
Anion gap: 11 (ref 5–15)
BUN: 21 mg/dL (ref 8–23)
CO2: 26 mmol/L (ref 22–32)
Calcium: 9 mg/dL (ref 8.9–10.3)
Chloride: 99 mmol/L (ref 98–111)
Creatinine, Ser: 0.8 mg/dL (ref 0.61–1.24)
GFR, Estimated: >60 mL/min (ref >60)
Glucose, Bld: 125 mg/dL — ABNORMAL HIGH (ref 70–99)
Potassium: 3.3 mmol/L — ABNORMAL LOW (ref 3.5–5.1)
Sodium: 136 mmol/L (ref 135–145)
Total Bilirubin: 0.8 mg/dL (ref 0.3–1.2)
Total Protein: 5.7 g/dL — ABNORMAL LOW (ref 6.5–8.1)

## 2023-01-30 LAB — LACTIC ACID, PLASMA: Lactic Acid, Venous: 1 mmol/L (ref 0.5–1.9)

## 2023-01-30 MED ORDER — SODIUM CHLORIDE 0.9 % IV BOLUS
500.0000 mL | Freq: Once | INTRAVENOUS | Status: AC
  Administered 2023-01-30: 500 mL via INTRAVENOUS

## 2023-01-30 NOTE — ED Triage Notes (Signed)
Pt BIB [facility] from home after family called for increased weakness and decreased appetite for the past week, today patient sat on side of bed and fell back onto the bed, usually he is able to sit on the side of the bed. Patient reports no pain, decreased appetite x2 months. A&Ox4, NAD, VSS.

## 2023-01-30 NOTE — ED Notes (Signed)
Pt daughter Levada Dy [phone] would like a update

## 2023-01-30 NOTE — Telephone Encounter (Signed)
Call from daughter.  Home health visited and recommended he be taken to the hospital.  He is enroute to [facility] hospital.  They do not feel he would want placement and are considering hospice care

## 2023-01-30 NOTE — ED Notes (Signed)
Patient noted to have 155m on bladder scan. Plan to in and out for specimen.

## 2023-01-30 NOTE — Discharge Instructions (Addendum)
You have been seen and discharged from the emergency department.  Your blood work was normal for you. UA did not show UTI.   Follow-up with your primary provider for further evaluation and further care. Take home medications as prescribed. If you have any worsening symptoms or further concerns for your health please return to an emergency department for further evaluation.

## 2023-01-30 NOTE — Progress Notes (Signed)
    SUBJECTIVE:   Phone Call - unable to connect via video  Discussed with daughter   Has not left the bed for a week.  He says he is trying but daughter can not lift him.  Rare bowel movements.  No bleeding.  No pain.  Eating a little oatmeal and ice cream per day.  Is able to converse  Discussed we are at a crossroads we can call an ambulance and take him to a hospital for tests and then likely will need to be discharge to a NH OR we can maximize comfort and consult hospcie Daughter is familiar with hospice as her Mom was involved the last 2 weeks of her life  Daughter will discuss with JJ her brother and be in touch  I gave her my cell number

## 2023-01-30 NOTE — ED Provider Notes (Signed)
[facility] Provider Note   CSN: [id number] Arrival date & time: 01/30/23  1416     History  Chief Complaint  Patient presents with   Failure to [place] is a 77 y.o. male.  HPI   77 year old male presents emergency department with weakness, decreased appetite.  Patient states he has felt this way for the past month, worse in the last week.  He presents today because he was unable to stand/uses walker which is baseline.  History of previous CVA with left-sided deficits.  Was originally at a rehab but sent home.  Was supposed to do outpatient physical therapy but he has not been able to secondary to weakness.  He denies any specific complaint including fever, chest pain, cough, abdominal pain, vomiting/diarrhea.  He states when he eats or takes a couple bites that he feels "unwell" and full so he stops eating.  He has not drink any fluids today due to this sensation.  Home Medications Prior to Admission medications   Medication Sig Start Date End Date Taking? Authorizing Provider  acetaminophen (TYLENOL) 325 MG tablet Take 2 tablets (650 mg total) by mouth every 4 (four) hours as needed for mild pain (or temp > 37.5 C (99.5 F)). 08/14/22   Angiulli, Lavon Paganini, PA-C  aspirin EC 81 MG tablet Take 1 tablet (81 mg total) by mouth daily. Swallow whole. 07/24/22   Lilland, Alana, DO  ondansetron (ZOFRAN-ODT) 8 MG disintegrating tablet Take 1 tablet (8 mg total) by mouth every 8 (eight) hours as needed for nausea or vomiting. 01/15/23   Lind Covert, MD  rosuvastatin (CRESTOR) 20 MG tablet Take 1 tablet (20 mg total) by mouth daily. 09/03/22   Lind Covert, MD  sertraline (ZOLOFT) 50 MG tablet Take 2 tablets (100 mg total) by mouth daily. 01/15/23   Lind Covert, MD      Allergies    Patient has no known allergies.    Review of Systems   Review of Systems  Constitutional:  Positive for appetite change and  fatigue. Negative for fever.  Respiratory:  Negative for shortness of breath.   Cardiovascular:  Negative for chest pain.  Gastrointestinal:  Positive for nausea. Negative for abdominal pain, diarrhea and vomiting.  Genitourinary:  Negative for flank pain.  Musculoskeletal:  Negative for back pain.  Skin:  Negative for rash.  Neurological:  Negative for dizziness, syncope, facial asymmetry, speech difficulty, light-headedness, numbness and headaches.    Physical Exam Updated Vital Signs BP 124/87   Pulse 81   Temp 98.7 F (37.1 C)   Resp 15   SpO2 97%  Physical Exam Vitals and nursing note reviewed.  Constitutional:      Appearance: Normal appearance.     Comments: Thin, frail appearing  HENT:     Head: Normocephalic.     Mouth/Throat:     Mouth: Mucous membranes are moist.  Eyes:     Extraocular Movements: Extraocular movements intact.  Cardiovascular:     Rate and Rhythm: Normal rate.  Pulmonary:     Effort: Pulmonary effort is normal. No respiratory distress.  Abdominal:     General: There is no distension.     Palpations: Abdomen is soft.     Tenderness: There is no abdominal tenderness. There is no guarding or rebound.  Skin:    General: Skin is warm.  Neurological:     Mental Status: He is alert and  oriented to person, place, and time. Mental status is at baseline.  Psychiatric:        Mood and Affect: Mood normal.     ED Results / Procedures / Treatments   Labs (all labs ordered are listed, but only abnormal results are displayed) Labs Reviewed  CBC WITH DIFFERENTIAL/PLATELET - Abnormal; Notable for the following components:      Result Value   HCT 38.9 (*)    All other components within normal limits  COMPREHENSIVE METABOLIC PANEL - Abnormal; Notable for the following components:   Potassium 3.3 (*)    Glucose, Bld 125 (*)    Total Protein 5.7 (*)    Albumin 3.0 (*)    AST 14 (*)    All other components within normal limits  LACTIC ACID, PLASMA   URINALYSIS, W/ REFLEX TO CULTURE (INFECTION SUSPECTED)  LACTIC ACID, PLASMA  LIPASE, BLOOD  TYPE AND SCREEN    EKG EKG Interpretation  Date/Time:  Wednesday January 30 2023 14:51:22 EDT Ventricular Rate:  81 PR Interval:  164 QRS Duration: 88 QT Interval:  374 QTC Calculation: 434 R Axis:   54 Text Interpretation: Normal sinus rhythm Cannot rule out Inferior infarct , age undetermined Abnormal ECG When compared with ECG of 25-Aug-2022 05:58, PREVIOUS ECG IS PRESENT Confirmed by Lavenia Atlas [id number]) on 01/30/2023 3:34:14 PM  Radiology No results found.  Procedures Procedures    Medications Ordered in ED Medications  sodium chloride 0.9 % bolus 500 mL (has no administration in time range)    ED Course/ Medical Decision Making/ A&P                             Medical Decision Making Amount and/or Complexity of Data Reviewed Radiology: ordered.   77 year old male presents to the emergency department with concern for generalized weakness, decreased appetite.  Patient states that he does not want to eat, when he tries to he feels full and does not want to eat further. Some epigastric fullness associated. Denies any other acute symptoms.  Workup here is reassuring, no acute lab abnormality. RUQ US shows no acute finding. It was difficult to obtain a urinalysis.  Patient required IV fluids but we were able to obtain a sample that showed no UTI.  Patient otherwise does not have any specific complaints that I feel warrant further emergent evaluation.  In regards to the weakness I offered for evaluation here in the ER in regards to potential rehab.  Patient adamantly declines and is requesting be discharged home.  He is otherwise alert and oriented per baseline.  Patient at this time appears safe and stable for discharge and close outpatient follow up. Discharge plan and strict return to ED precautions discussed, patient verbalizes understanding and agreement.        Final  Clinical Impression(s) / ED Diagnoses Final diagnoses:  None    Rx / DC Orders ED Discharge Orders     None         Lorelle Gibbs, DO 01/30/23 2325

## 2023-01-31 DIAGNOSIS — R531 Weakness: Secondary | ICD-10-CM | POA: Diagnosis not present

## 2023-01-31 DIAGNOSIS — Z7401 Bed confinement status: Secondary | ICD-10-CM | POA: Diagnosis not present

## 2023-01-31 DIAGNOSIS — Z743 Need for continuous supervision: Secondary | ICD-10-CM | POA: Diagnosis not present

## 2023-01-31 NOTE — ED Notes (Signed)
UA

## 2023-02-02 ENCOUNTER — Other Ambulatory Visit: Payer: Self-pay | Admitting: Family Medicine

## 2023-02-03 ENCOUNTER — Encounter: Payer: Self-pay | Admitting: Family Medicine

## 2023-02-03 DIAGNOSIS — I6381 Other cerebral infarction due to occlusion or stenosis of small artery: Secondary | ICD-10-CM

## 2023-02-04 ENCOUNTER — Ambulatory Visit: Payer: Medicare HMO | Admitting: Occupational Therapy

## 2023-02-04 ENCOUNTER — Telehealth: Payer: Self-pay | Admitting: Family Medicine

## 2023-02-04 ENCOUNTER — Ambulatory Visit: Payer: Medicare HMO | Admitting: Physical Therapy

## 2023-02-04 MED ORDER — SERTRALINE HCL 100 MG PO TABS: 100.0000 mg | ORAL_TABLET | Freq: Every day | ORAL | 3 refills | Status: DC

## 2023-02-04 NOTE — Telephone Encounter (Signed)
Called patient to schedule Medicare Annual Wellness Visit (AWV). No voicemail available to leave a message.  Last date of AWV: AWVI eligible as of 11/20/2011  Please schedule an AWVI appointment at any time with [place].  If any questions, please contact me at [phone].   Thank you,  [place] Direct dial  [phone]

## 2023-02-05 ENCOUNTER — Telehealth: Payer: Self-pay

## 2023-02-07 ENCOUNTER — Encounter: Payer: Medicare HMO | Admitting: Occupational Therapy

## 2023-02-07 ENCOUNTER — Encounter: Payer: Medicare HMO | Admitting: Speech Pathology

## 2023-02-07 ENCOUNTER — Ambulatory Visit: Payer: Medicare HMO | Admitting: Physical Therapy

## 2023-02-08 NOTE — Addendum Note (Signed)
Addended by: Talbert Cage L on: 02/08/2023 12:00 PM   Modules accepted: Orders

## 2023-02-11 ENCOUNTER — Telehealth: Payer: Self-pay

## 2023-02-11 ENCOUNTER — Ambulatory Visit: Payer: Medicare HMO | Admitting: Physical Therapy

## 2023-02-11 DIAGNOSIS — I639 Cerebral infarction, unspecified: Secondary | ICD-10-CM | POA: Diagnosis not present

## 2023-02-11 NOTE — Telephone Encounter (Signed)
Nehemiah Settle with [facility] calls nurse line in regards to recent hospice referral.   Nehemiah Settle reports in order for patient to enter hospice the PCP must believe he has less than 6 months to live.   Nehemiah Settle reports the PCP has the option to be the patients attending physician during hospice and defer comfort orders to hospice physician. Or she reports PCP can be responsible for both.  Will forward to PCP for advisement.    Nehemiah [phone]

## 2023-02-11 NOTE — Transitions of Care (Post Inpatient/ED Visit) (Signed)
   02/11/2023  Name: Daniel [name] MRN: [id number] DOB: Aug 03, 1946 LATE ENTRY, CALL MADE 02/05/23 Today's TOC FU Call Status: Today's TOC FU Call Status:: Successful TOC FU Call Competed TOC FU Call Complete Date: 02/05/23  Transition Care Management Follow-up Telephone Call Date of Discharge: 01/30/23 [facility] Type of Discharge: Emergency Department Reason for ED Visit: Other: (Decreaded appetite) How have you been since you were released from the hospital?: Same Any questions or concerns?: Yes Patient Questions/Concerns:: Patient and his daughter would like to look into Hospice care Patient Questions/Concerns Addressed: Notified Provider of Patient Questions/Concerns  Items Reviewed: Did you receive and understand the discharge instructions provided?: Yes Medications obtained and verified?: Yes (Medications Reviewed) Any new allergies since your discharge?: No Dietary orders reviewed?: NA Do you have support at home?: Yes People in Home: child(ren), adult Name of Support/Comfort Primary Source: Daughter- [facility] and Equipment/Supplies: [place] Ordered?: No Any new equipment or medical supplies ordered?: No  Functional Questionnaire: Do you need assistance with bathing/showering or dressing?: Yes Do you need assistance with meal preparation?: Yes Do you need assistance with eating?: No Do you have difficulty maintaining continence: No Do you need assistance with getting out of bed/getting out of a chair/moving?: Yes Do you have difficulty managing or taking your medications?: Yes  Follow up appointments reviewed: PCP Follow-up appointment confirmed?: Yes Date of PCP follow-up appointment?: 02/26/23 Follow-up Provider: Dr. Erin [facility] Special[facility] Follow-up appointment confirmed?: NA Do you need transportation to your follow-up appointment?: No Do you understand care options if your condition(s) worsen?:  Yes-patient verbalized understanding  Johnney Killian, RN, BSN, CCM Care Management Coordinator [facility] Phone: [phone]: [phone]

## 2023-02-12 DIAGNOSIS — I639 Cerebral infarction, unspecified: Secondary | ICD-10-CM | POA: Diagnosis not present

## 2023-02-12 NOTE — Telephone Encounter (Signed)
Spoke with her yesterday Agreed to be phsician of record They will prescribe comfort medications

## 2023-02-14 ENCOUNTER — Ambulatory Visit: Payer: Medicare HMO | Admitting: Physical Therapy

## 2023-02-14 ENCOUNTER — Encounter: Payer: Medicare HMO | Admitting: Speech Pathology

## 2023-02-15 ENCOUNTER — Other Ambulatory Visit: Payer: Self-pay | Admitting: Family Medicine

## 2023-02-19 ENCOUNTER — Telehealth: Payer: Self-pay | Admitting: Neurology

## 2023-02-19 NOTE — Telephone Encounter (Signed)
Attempted to call pt, call could not be completed. Sent mychart msg informing pt of need to reschedule 03/25/23 appointment - NP out

## 2023-02-22 ENCOUNTER — Encounter: Payer: Self-pay | Admitting: Family Medicine

## 2023-02-26 ENCOUNTER — Telehealth: Payer: Self-pay | Admitting: Family Medicine

## 2023-02-26 ENCOUNTER — Ambulatory Visit: Payer: Medicare HMO | Admitting: Family Medicine

## 2023-02-26 NOTE — Telephone Encounter (Signed)
Spoke w daughter Daniel [name] He can barely leave the bed He is eating some fries and hamburger  Hospice giving baths twice a week and nurse is visiting On pill for itching  Asked them to call if any problems or they need something

## 2023-03-01 ENCOUNTER — Other Ambulatory Visit: Payer: Self-pay | Admitting: Family Medicine

## 2023-03-13 ENCOUNTER — Telehealth: Payer: Self-pay | Admitting: Family Medicine

## 2023-03-13 NOTE — Telephone Encounter (Signed)
Spoke with daughter Lawanna Kobus  Eating better Not leaving bed  Alert and in no pain  Ruby from hospice is helping   Asked them to contact me if need anything

## 2023-03-15 DIAGNOSIS — I639 Cerebral infarction, unspecified: Secondary | ICD-10-CM | POA: Diagnosis not present

## 2023-03-25 ENCOUNTER — Ambulatory Visit: Payer: Medicare HMO | Admitting: Neurology

## 2023-03-26 ENCOUNTER — Other Ambulatory Visit: Payer: Self-pay | Admitting: Family Medicine

## 2023-03-28 ENCOUNTER — Ambulatory Visit: Payer: Medicare HMO | Admitting: Neurology

## 2023-04-01 ENCOUNTER — Other Ambulatory Visit: Payer: Self-pay | Admitting: Family Medicine

## 2023-04-14 DIAGNOSIS — I639 Cerebral infarction, unspecified: Secondary | ICD-10-CM | POA: Diagnosis not present

## 2023-05-07 ENCOUNTER — Other Ambulatory Visit: Payer: Self-pay | Admitting: Family Medicine

## 2023-05-15 DIAGNOSIS — I639 Cerebral infarction, unspecified: Secondary | ICD-10-CM | POA: Diagnosis not present

## 2023-06-07 DIAGNOSIS — R531 Weakness: Secondary | ICD-10-CM | POA: Diagnosis not present

## 2023-06-07 DIAGNOSIS — Z743 Need for continuous supervision: Secondary | ICD-10-CM | POA: Diagnosis not present

## 2023-06-14 DIAGNOSIS — I639 Cerebral infarction, unspecified: Secondary | ICD-10-CM | POA: Diagnosis not present

## 2023-07-19 ENCOUNTER — Ambulatory Visit: Payer: Medicare HMO

## 2023-08-20 DEATH — deceased
